# Patient Record
Sex: Male | Born: 1960 | Race: White | Hispanic: No | Marital: Married | Smoking: Former smoker
Health system: Southern US, Community
[De-identification: ages and names within clinical notes are randomized; demographics above are authoritative.]

## PROBLEM LIST (undated history)

## (undated) DIAGNOSIS — N189 Chronic kidney disease, unspecified: Secondary | ICD-10-CM

## (undated) DIAGNOSIS — I499 Cardiac arrhythmia, unspecified: Secondary | ICD-10-CM

## (undated) DIAGNOSIS — D649 Anemia, unspecified: Secondary | ICD-10-CM

## (undated) DIAGNOSIS — E785 Hyperlipidemia, unspecified: Secondary | ICD-10-CM

## (undated) DIAGNOSIS — Z86010 Personal history of colonic polyps: Secondary | ICD-10-CM

## (undated) DIAGNOSIS — R011 Cardiac murmur, unspecified: Secondary | ICD-10-CM

## (undated) DIAGNOSIS — Z87442 Personal history of urinary calculi: Secondary | ICD-10-CM

## (undated) DIAGNOSIS — T7840XA Allergy, unspecified, initial encounter: Secondary | ICD-10-CM

## (undated) DIAGNOSIS — M1990 Unspecified osteoarthritis, unspecified site: Secondary | ICD-10-CM

## (undated) DIAGNOSIS — I10 Essential (primary) hypertension: Secondary | ICD-10-CM

## (undated) DIAGNOSIS — Z860101 Personal history of adenomatous and serrated colon polyps: Secondary | ICD-10-CM

## (undated) DIAGNOSIS — Z8719 Personal history of other diseases of the digestive system: Secondary | ICD-10-CM

## (undated) DIAGNOSIS — K219 Gastro-esophageal reflux disease without esophagitis: Secondary | ICD-10-CM

## (undated) HISTORY — DX: Cardiac murmur, unspecified: R01.1

## (undated) HISTORY — PX: OTHER SURGICAL HISTORY: SHX169

## (undated) HISTORY — DX: Gastro-esophageal reflux disease without esophagitis: K21.9

## (undated) HISTORY — PX: CHOLECYSTECTOMY: SHX55

## (undated) HISTORY — PX: VASECTOMY: SHX75

## (undated) HISTORY — DX: Anemia, unspecified: D64.9

## (undated) HISTORY — DX: Personal history of colonic polyps: Z86.010

## (undated) HISTORY — DX: Personal history of adenomatous and serrated colon polyps: Z86.0101

## (undated) HISTORY — DX: Hyperlipidemia, unspecified: E78.5

## (undated) HISTORY — DX: Allergy, unspecified, initial encounter: T78.40XA

## (undated) HISTORY — PX: APPENDECTOMY: SHX54

## (undated) HISTORY — PX: COLONOSCOPY: SHX174

## (undated) HISTORY — DX: Chronic kidney disease, unspecified: N18.9

---

## 2014-11-11 ENCOUNTER — Ambulatory Visit (INDEPENDENT_AMBULATORY_CARE_PROVIDER_SITE_OTHER): Payer: BLUE CROSS/BLUE SHIELD | Admitting: Family Medicine

## 2014-11-11 VITALS — BP 152/92 | HR 107 | Temp 98.4°F | Resp 18 | Ht 70.0 in | Wt 236.0 lb

## 2014-11-11 DIAGNOSIS — R05 Cough: Secondary | ICD-10-CM | POA: Diagnosis not present

## 2014-11-11 DIAGNOSIS — J069 Acute upper respiratory infection, unspecified: Secondary | ICD-10-CM | POA: Diagnosis not present

## 2014-11-11 DIAGNOSIS — R030 Elevated blood-pressure reading, without diagnosis of hypertension: Secondary | ICD-10-CM | POA: Diagnosis not present

## 2014-11-11 DIAGNOSIS — R059 Cough, unspecified: Secondary | ICD-10-CM

## 2014-11-11 MED ORDER — HYDROCODONE-HOMATROPINE 5-1.5 MG/5ML PO SYRP
ORAL_SOLUTION | ORAL | Status: DC
Start: 2014-11-11 — End: 2015-02-21

## 2014-11-11 MED ORDER — AZITHROMYCIN 250 MG PO TABS: ORAL_TABLET | ORAL | Status: DC

## 2014-11-11 NOTE — Patient Instructions (Signed)
Keep a record of your blood pressures and heart rate outside of the office and BP remaining over 140/90, or heart rate over 100 - return here or other care provider.  Saline nasal spray atleast 4 times per day, over the counter mucinex or mucinex DM for cough during day, hydrocodone cough syrup if needed at night, drink plenty of fluids.   If you are not improving at a week to 10 days, or worsening - can fill antibiotic as discussed, but currently this appears to be due to a virus. See other information below. If fever or shortness of breath - be seen by care provider.   Cough, Adult  A cough is a reflex that helps clear your throat and airways. It can help heal the body or may be a reaction to an irritated airway. A cough may only last 2 or 3 weeks (acute) or may last more than 8 weeks (chronic).  CAUSES Acute cough:  Viral or bacterial infections. Chronic cough:  Infections.  Allergies.  Asthma.  Post-nasal drip.  Smoking.  Heartburn or acid reflux.  Some medicines.  Chronic lung problems (COPD).  Cancer. SYMPTOMS   Cough.  Fever.  Chest pain.  Increased breathing rate.  High-pitched whistling sound when breathing (wheezing).  Colored mucus that you cough up (sputum). TREATMENT   A bacterial cough may be treated with antibiotic medicine.  A viral cough must run its course and will not respond to antibiotics.  Your caregiver may recommend other treatments if you have a chronic cough. HOME CARE INSTRUCTIONS   Only take over-the-counter or prescription medicines for pain, discomfort, or fever as directed by your caregiver. Use cough suppressants only as directed by your caregiver.  Use a cold steam vaporizer or humidifier in your bedroom or home to help loosen secretions.  Sleep in a semi-upright position if your cough is worse at night.  Rest as needed.  Stop smoking if you smoke. SEEK IMMEDIATE MEDICAL CARE IF:   You have pus in your sputum.  Your  cough starts to worsen.  You cannot control your cough with suppressants and are losing sleep.  You begin coughing up blood.  You have difficulty breathing.  You develop pain which is getting worse or is uncontrolled with medicine.  You have a fever. MAKE SURE YOU:   Understand these instructions.  Will watch your condition.  Will get help right away if you are not doing well or get worse. Document Released: 08/18/2010 Document Revised: 05/14/2011 Document Reviewed: 08/18/2010 [facility] Patient Information 2015 [place], [place]. This information is not intended to replace advice given to you by your health care provider. Make sure you discuss any questions you have with your health care provider.   Upper Respiratory Infection, Adult An upper respiratory infection (URI) is also sometimes known as the common cold. The upper respiratory tract includes the nose, sinuses, throat, trachea, and bronchi. Bronchi are the airways leading to the lungs. Most people improve within 1 week, but symptoms can last up to 2 weeks. A residual cough may last even longer.  CAUSES Many different viruses can infect the tissues lining the upper respiratory tract. The tissues become irritated and inflamed and often become very moist. Mucus production is also common. A cold is contagious. You can easily spread the virus to others by oral contact. This includes kissing, sharing a glass, coughing, or sneezing. Touching your mouth or nose and then touching a surface, which is then touched by another person, can also spread  the virus. SYMPTOMS  Symptoms typically develop 1 to 3 days after you come in contact with a cold virus. Symptoms vary from person to person. They may include:  Runny nose.  Sneezing.  Nasal congestion.  Sinus irritation.  Sore throat.  Loss of voice (laryngitis).  Cough.  Fatigue.  Muscle aches.  Loss of appetite.  Headache.  Low-grade fever. DIAGNOSIS  You might diagnose  your own cold based on familiar symptoms, since most people get a cold 2 to 3 times a year. Your caregiver can confirm this based on your exam. Most importantly, your caregiver can check that your symptoms are not due to another disease such as strep throat, sinusitis, pneumonia, asthma, or epiglottitis. Blood tests, throat tests, and X-rays are not necessary to diagnose a common cold, but they may sometimes be helpful in excluding other more serious diseases. Your caregiver will decide if any further tests are required. RISKS AND COMPLICATIONS  You may be at risk for a more severe case of the common cold if you smoke cigarettes, have chronic heart disease (such as heart failure) or lung disease (such as asthma), or if you have a weakened immune system. The very young and very old are also at risk for more serious infections. Bacterial sinusitis, middle ear infections, and bacterial pneumonia can complicate the common cold. The common cold can worsen asthma and chronic obstructive pulmonary disease (COPD). Sometimes, these complications can require emergency medical care and may be life-threatening. PREVENTION  The best way to protect against getting a cold is to practice good hygiene. Avoid oral or hand contact with people with cold symptoms. Wash your hands often if contact occurs. There is no clear evidence that vitamin C, vitamin E, echinacea, or exercise reduces the chance of developing a cold. However, it is always recommended to get plenty of rest and practice good nutrition. TREATMENT  Treatment is directed at relieving symptoms. There is no cure. Antibiotics are not effective, because the infection is caused by a virus, not by bacteria. Treatment may include:  Increased fluid intake. Sports drinks offer valuable electrolytes, sugars, and fluids.  Breathing heated mist or steam (vaporizer or shower).  Eating chicken soup or other clear broths, and maintaining good nutrition.  Getting plenty of  rest.  Using gargles or lozenges for comfort.  Controlling fevers with ibuprofen or acetaminophen as directed by your caregiver.  Increasing usage of your inhaler if you have asthma. Zinc gel and zinc lozenges, taken in the first 24 hours of the common cold, can shorten the duration and lessen the severity of symptoms. Pain medicines may help with fever, muscle aches, and throat pain. A variety of non-prescription medicines are available to treat congestion and runny nose. Your caregiver can make recommendations and may suggest nasal or lung inhalers for other symptoms.  HOME CARE INSTRUCTIONS   Only take over-the-counter or prescription medicines for pain, discomfort, or fever as directed by your caregiver.  Use a warm mist humidifier or inhale steam from a shower to increase air moisture. This may keep secretions moist and make it easier to breathe.  Drink enough water and fluids to keep your urine clear or pale yellow.  Rest as needed.  Return to work when your temperature has returned to normal or as your caregiver advises. You may need to stay home longer to avoid infecting others. You can also use a face mask and careful hand washing to prevent spread of the virus. SEEK MEDICAL CARE IF:   After  the first few days, you feel you are getting worse rather than better.  You need your caregiver's advice about medicines to control symptoms.  You develop chills, worsening shortness of breath, or brown or red sputum. These may be signs of pneumonia.  You develop yellow or brown nasal discharge or pain in the face, especially when you bend forward. These may be signs of sinusitis.  You develop a fever, swollen neck glands, pain with swallowing, or white areas in the back of your throat. These may be signs of strep throat. SEEK IMMEDIATE MEDICAL CARE IF:   You have a fever.  You develop severe or persistent headache, ear pain, sinus pain, or chest pain.  You develop wheezing, a  prolonged cough, cough up blood, or have a change in your usual mucus (if you have chronic lung disease).  You develop sore muscles or a stiff neck. Document Released: 08/15/2000 Document Revised: 05/14/2011 Document Reviewed: 05/27/2013 [facility] Patient Information 2015 [place], [place]. This information is not intended to replace advice given to you by your health care provider. Make sure you discuss any questions you have with your health care provider.

## 2014-11-11 NOTE — Progress Notes (Signed)
Subjective:  This chart was scribed for Corliss Parish, MD by Leandra Kern, Medical Scribe. This patient was seen in Room 10 and the patient's care was started at 10:30 AM.   Patient ID: Patrick [name], male    DOB: 11-Mar-1960, 54 y.o.   MRN: [id number]  Chief Complaint  Patient presents with  . Nasal Congestion    x 4 days  . Cough  . Sore Throat  . Chest Pain   HPI HPI Comments: Patrick [name] is a 54 y.o. male who presents to [facility] complaining of nasal congestion, gradual onset 4 days ago.  Pt reports that the symptoms initially started as nasal congestion, sinus pressure, and subjective fever, and then progressed into chest congestion and cough that is keeping him up at night yesterday. Pt reports that he does not have any previous medical history of asthma. Pt takes Musinex DM and mullein cough syrup for the symptoms, however he only found mild relief with them. Pt denies chest pain, or difficulty breathing. Pt notes that his wife was seen by for similar respiratory illness, she was prescribed Z-pack for the symptoms. Pt indicates that he uses Benadryl BID, and Zyrtec for his seasonal allergies.   Pt is planning to travel to [place] in 2 days.       There are no active problems to display for this patient.  Past Medical History  Diagnosis Date  . Allergy   . Heart murmur    Past Surgical History  Procedure Laterality Date  . Appendectomy    . Coronary artery bypass graft    . Vasectomy     Allergies  Allergen Reactions  . Penicillins Rash    Rash around neck    Prior to Admission medications   Medication Sig Start Date End Date Taking? Authorizing Provider  aspirin 81 MG tablet Take 81 mg by mouth daily.   Yes Historical Provider, MD  cetirizine (ZYRTEC) 10 MG tablet Take 10 mg by mouth daily.   Yes Historical Provider, MD  diphenhydrAMINE (BENADRYL) 25 MG tablet Take 25 mg by mouth every 6 (six) hours as needed.   Yes Historical Provider, MD    glucosamine-chondroitin 500-400 MG tablet Take 500 tablets by mouth 2 (two) times daily.   Yes Historical Provider, MD  Javier Docker Oil 300 MG CAPS Take 300 mg by mouth every morning.   Yes Historical Provider, MD  Multiple Vitamins-Minerals (CENTRUM SILVER ADULT 50+ PO) Take by mouth.   Yes Historical Provider, MD  niacin 500 MG tablet Take 500 mg by mouth 2 (two) times daily.   Yes Historical Provider, MD   Social History   Social History  . Marital Status: Married    Spouse Name: N/A  . Number of Children: N/A  . Years of Education: N/A   Occupational History  . Not on file.   Social History Main Topics  . Smoking status: Never Smoker   . Smokeless tobacco: Not on file  . Alcohol Use: Not on file  . Drug Use: Not on file  . Sexual Activity: Not on file   Other Topics Concern  . Not on file   Social History Narrative  . No narrative on file    Review of Systems  Constitutional: Positive for fever. Negative for fatigue and unexpected weight change.  HENT: Positive for congestion and sinus pressure.   Eyes: Negative for visual disturbance.  Respiratory: Positive for cough and chest tightness. Negative for shortness of breath.  Cardiovascular: Negative for chest pain, palpitations and leg swelling.  Gastrointestinal: Negative for abdominal pain and blood in stool.  Neurological: Negative for dizziness, light-headedness and headaches.      Objective:   Physical Exam  Constitutional: He is oriented to person, place, and time. He appears well-developed and well-nourished.  HENT:  Head: Normocephalic and atraumatic.  Right Ear: Tympanic membrane, external ear and ear canal normal.  Left Ear: Tympanic membrane, external ear and ear canal normal.  Nose: No rhinorrhea.  Mouth/Throat: Oropharynx is clear and moist and mucous membranes are normal. No oropharyngeal exudate or posterior oropharyngeal erythema.  Minimal clear nasal discharge. Sinuses are non tender.   Eyes:  Conjunctivae are normal. Pupils are equal, round, and reactive to light.  Neck: Neck supple.  Cardiovascular: Normal rate, regular rhythm, normal heart sounds and intact distal pulses.   No murmur heard. Pulmonary/Chest: Effort normal and breath sounds normal. No respiratory distress. He has no wheezes. He has no rhonchi. He has no rales.  Abdominal: Soft. There is no tenderness.  Lymphadenopathy:    He has no cervical adenopathy.  Neurological: He is alert and oriented to person, place, and time.  Skin: Skin is warm and dry. No rash noted.  Psychiatric: He has a normal mood and affect. His behavior is normal.  Vitals reviewed.   Filed Vitals:   11/11/14 1031  BP: 152/92  Pulse: 107  Temp: 98.4 F (36.9 C)  TempSrc: Oral  Resp: 18  Height: 5\' 10"  (1.778 m)  Weight: 236 lb (107.049 kg)  SpO2: 98%      Assessment & Plan:   Patrick [name] is a 54 y.o. male Acute upper respiratory infection, Cough - Plan: HYDROcodone-homatropine (HYCODAN) 5-1.5 MG/5ML syrup, azithromycin (ZITHROMAX) 250 MG tablet  - Symptomatic care discussed for probable viral infection at this time. With upcoming travel, prescribed azithromycin if needed if symptoms are not improving as discussed in after visit summary, but again appears to be viral at this point  -Hydrocodone cough syrup provided at nighttime if needed for cough, continue Mucinex during the day if needed.  Elevated blood pressure reading without diagnosis of hypertension  -May be related to cough. Check blood pressures outside the office and if remaining elevated should follow-up for evaluation.   Meds ordered this encounter  Medications  . aspirin 81 MG tablet    Sig: Take 81 mg by mouth daily.  . diphenhydrAMINE (BENADRYL) 25 MG tablet    Sig: Take 25 mg by mouth every 6 (six) hours as needed.  . cetirizine (ZYRTEC) 10 MG tablet    Sig: Take 10 mg by mouth daily.  . niacin 500 MG tablet    Sig: Take 500 mg by mouth 2 (two) times daily.    Marland Kitchen glucosamine-chondroitin 500-400 MG tablet    Sig: Take 500 tablets by mouth 2 (two) times daily.  Javier Docker Oil 300 MG CAPS    Sig: Take 300 mg by mouth every morning.  . Multiple Vitamins-Minerals (CENTRUM SILVER ADULT 50+ PO)    Sig: Take by mouth.  Marland Kitchen HYDROcodone-homatropine (HYCODAN) 5-1.5 MG/5ML syrup    Sig: 15m by mouth a bedtime as needed for cough.    Dispense:  120 mL    Refill:  0  . azithromycin (ZITHROMAX) 250 MG tablet    Sig: Take 2 pills by mouth on day 1, then 1 pill by mouth per day on days 2 through 5.    Dispense:  6 tablet    Refill:  0   Patient Instructions  Keep a record of your blood pressures and heart rate outside of the office and BP remaining over 140/90, or heart rate over 100 - return here or other care provider.  Saline nasal spray atleast 4 times per day, over the counter mucinex or mucinex DM for cough during day, hydrocodone cough syrup if needed at night, drink plenty of fluids.   If you are not improving at a week to 10 days, or worsening - can fill antibiotic as discussed, but currently this appears to be due to a virus. See other information below. If fever or shortness of breath - be seen by care provider.   Cough, Adult  A cough is a reflex that helps clear your throat and airways. It can help heal the body or may be a reaction to an irritated airway. A cough may only last 2 or 3 weeks (acute) or may last more than 8 weeks (chronic).  CAUSES Acute cough:  Viral or bacterial infections. Chronic cough:  Infections.  Allergies.  Asthma.  Post-nasal drip.  Smoking.  Heartburn or acid reflux.  Some medicines.  Chronic lung problems (COPD).  Cancer. SYMPTOMS   Cough.  Fever.  Chest pain.  Increased breathing rate.  High-pitched whistling sound when breathing (wheezing).  Colored mucus that you cough up (sputum). TREATMENT   A bacterial cough may be treated with antibiotic medicine.  A viral cough must run its course and  will not respond to antibiotics.  Your caregiver may recommend other treatments if you have a chronic cough. HOME CARE INSTRUCTIONS   Only take over-the-counter or prescription medicines for pain, discomfort, or fever as directed by your caregiver. Use cough suppressants only as directed by your caregiver.  Use a cold steam vaporizer or humidifier in your bedroom or home to help loosen secretions.  Sleep in a semi-upright position if your cough is worse at night.  Rest as needed.  Stop smoking if you smoke. SEEK IMMEDIATE MEDICAL CARE IF:   You have pus in your sputum.  Your cough starts to worsen.  You cannot control your cough with suppressants and are losing sleep.  You begin coughing up blood.  You have difficulty breathing.  You develop pain which is getting worse or is uncontrolled with medicine.  You have a fever. MAKE SURE YOU:   Understand these instructions.  Will watch your condition.  Will get help right away if you are not doing well or get worse. Document Released: 08/18/2010 Document Revised: 05/14/2011 Document Reviewed: 08/18/2010 [facility] Patient Information 2015 [place], [place]. This information is not intended to replace advice given to you by your health care provider. Make sure you discuss any questions you have with your health care provider.   Upper Respiratory Infection, Adult An upper respiratory infection (URI) is also sometimes known as the common cold. The upper respiratory tract includes the nose, sinuses, throat, trachea, and bronchi. Bronchi are the airways leading to the lungs. Most people improve within 1 week, but symptoms can last up to 2 weeks. A residual cough may last even longer.  CAUSES Many different viruses can infect the tissues lining the upper respiratory tract. The tissues become irritated and inflamed and often become very moist. Mucus production is also common. A cold is contagious. You can easily spread the virus to others  by oral contact. This includes kissing, sharing a glass, coughing, or sneezing. Touching your mouth or nose and then touching a surface, which is then  touched by another person, can also spread the virus. SYMPTOMS  Symptoms typically develop 1 to 3 days after you come in contact with a cold virus. Symptoms vary from person to person. They may include:  Runny nose.  Sneezing.  Nasal congestion.  Sinus irritation.  Sore throat.  Loss of voice (laryngitis).  Cough.  Fatigue.  Muscle aches.  Loss of appetite.  Headache.  Low-grade fever. DIAGNOSIS  You might diagnose your own cold based on familiar symptoms, since most people get a cold 2 to 3 times a year. Your caregiver can confirm this based on your exam. Most importantly, your caregiver can check that your symptoms are not due to another disease such as strep throat, sinusitis, pneumonia, asthma, or epiglottitis. Blood tests, throat tests, and X-rays are not necessary to diagnose a common cold, but they may sometimes be helpful in excluding other more serious diseases. Your caregiver will decide if any further tests are required. RISKS AND COMPLICATIONS  You may be at risk for a more severe case of the common cold if you smoke cigarettes, have chronic heart disease (such as heart failure) or lung disease (such as asthma), or if you have a weakened immune system. The very young and very old are also at risk for more serious infections. Bacterial sinusitis, middle ear infections, and bacterial pneumonia can complicate the common cold. The common cold can worsen asthma and chronic obstructive pulmonary disease (COPD). Sometimes, these complications can require emergency medical care and may be life-threatening. PREVENTION  The best way to protect against getting a cold is to practice good hygiene. Avoid oral or hand contact with people with cold symptoms. Wash your hands often if contact occurs. There is no clear evidence that vitamin C,  vitamin E, echinacea, or exercise reduces the chance of developing a cold. However, it is always recommended to get plenty of rest and practice good nutrition. TREATMENT  Treatment is directed at relieving symptoms. There is no cure. Antibiotics are not effective, because the infection is caused by a virus, not by bacteria. Treatment may include:  Increased fluid intake. Sports drinks offer valuable electrolytes, sugars, and fluids.  Breathing heated mist or steam (vaporizer or shower).  Eating chicken soup or other clear broths, and maintaining good nutrition.  Getting plenty of rest.  Using gargles or lozenges for comfort.  Controlling fevers with ibuprofen or acetaminophen as directed by your caregiver.  Increasing usage of your inhaler if you have asthma. Zinc gel and zinc lozenges, taken in the first 24 hours of the common cold, can shorten the duration and lessen the severity of symptoms. Pain medicines may help with fever, muscle aches, and throat pain. A variety of non-prescription medicines are available to treat congestion and runny nose. Your caregiver can make recommendations and may suggest nasal or lung inhalers for other symptoms.  HOME CARE INSTRUCTIONS   Only take over-the-counter or prescription medicines for pain, discomfort, or fever as directed by your caregiver.  Use a warm mist humidifier or inhale steam from a shower to increase air moisture. This may keep secretions moist and make it easier to breathe.  Drink enough water and fluids to keep your urine clear or pale yellow.  Rest as needed.  Return to work when your temperature has returned to normal or as your caregiver advises. You may need to stay home longer to avoid infecting others. You can also use a face mask and careful hand washing to prevent spread of the virus. SEEK  MEDICAL CARE IF:   After the first few days, you feel you are getting worse rather than better.  You need your caregiver's advice  about medicines to control symptoms.  You develop chills, worsening shortness of breath, or brown or red sputum. These may be signs of pneumonia.  You develop yellow or brown nasal discharge or pain in the face, especially when you bend forward. These may be signs of sinusitis.  You develop a fever, swollen neck glands, pain with swallowing, or white areas in the back of your throat. These may be signs of strep throat. SEEK IMMEDIATE MEDICAL CARE IF:   You have a fever.  You develop severe or persistent headache, ear pain, sinus pain, or chest pain.  You develop wheezing, a prolonged cough, cough up blood, or have a change in your usual mucus (if you have chronic lung disease).  You develop sore muscles or a stiff neck. Document Released: 08/15/2000 Document Revised: 05/14/2011 Document Reviewed: 05/27/2013 [facility] Patient Information 2015 [place], [place]. This information is not intended to replace advice given to you by your health care provider. Make sure you discuss any questions you have with your health care provider.     I personally performed the services described in this documentation, which was scribed in my presence. The recorded information has been reviewed and considered, and addended by me as needed.

## 2014-12-15 ENCOUNTER — Ambulatory Visit (INDEPENDENT_AMBULATORY_CARE_PROVIDER_SITE_OTHER): Payer: BLUE CROSS/BLUE SHIELD

## 2014-12-15 ENCOUNTER — Ambulatory Visit (INDEPENDENT_AMBULATORY_CARE_PROVIDER_SITE_OTHER): Payer: BLUE CROSS/BLUE SHIELD | Admitting: Emergency Medicine

## 2014-12-15 VITALS — BP 130/82 | HR 80 | Temp 98.2°F | Resp 18 | Ht 70.0 in | Wt 235.0 lb

## 2014-12-15 DIAGNOSIS — R059 Cough, unspecified: Secondary | ICD-10-CM

## 2014-12-15 DIAGNOSIS — R05 Cough: Secondary | ICD-10-CM

## 2014-12-15 MED ORDER — PREDNISONE 10 MG PO TABS: ORAL_TABLET | ORAL | Status: DC

## 2014-12-15 MED ORDER — BENZONATATE 100 MG PO CAPS: 100.0000 mg | ORAL_CAPSULE | Freq: Three times a day (TID) | ORAL | Status: DC | PRN

## 2014-12-15 MED ORDER — ALBUTEROL SULFATE (2.5 MG/3ML) 0.083% IN NEBU
2.5000 mg | INHALATION_SOLUTION | Freq: Once | RESPIRATORY_TRACT | Status: AC
  Administered 2014-12-15: 2.5 mg via RESPIRATORY_TRACT

## 2014-12-15 MED ORDER — IPRATROPIUM BROMIDE 0.02 % IN SOLN
0.5000 mg | Freq: Once | RESPIRATORY_TRACT | Status: AC
  Administered 2014-12-15: 0.5 mg via RESPIRATORY_TRACT

## 2014-12-15 NOTE — Patient Instructions (Signed)
Bronchospasm, Adult A bronchospasm is when the tubes that carry air in and out of your lungs (airways) spasm or tighten. During a bronchospasm it is hard to breathe. This is because the airways get smaller. A bronchospasm can be triggered by:  Allergies. These may be to animals, pollen, food, or mold.  Infection. This is a common cause of bronchospasm.  Exercise.  Irritants. These include pollution, cigarette smoke, strong odors, aerosol sprays, and paint fumes.  Weather changes.  Stress.  Being emotional. HOME CARE   Always have a plan for getting help. Know when to call your doctor and local emergency services (911 in the [place] Know where you can get emergency care.  Only take medicines as told by your doctor.  If you were prescribed an inhaler or nebulizer machine, ask your doctor how to use it correctly. Always use a spacer with your inhaler if you were given one.  Stay calm during an attack. Try to relax and breathe more slowly.  Control your home environment:  Change your heating and air conditioning filter at least once a month.  Limit your use of fireplaces and wood stoves.  Do not  smoke. Do not  allow smoking in your home.  Avoid perfumes and fragrances.  Get rid of pests (such as roaches and mice) and their droppings.  Throw away plants if you see mold on them.  Keep your house clean and dust free.  Replace carpet with wood, tile, or vinyl flooring. Carpet can trap dander and dust.  Use allergy-proof pillows, mattress covers, and box spring covers.  Wash bed sheets and blankets every week in hot water. Dry them in a dryer.  Use blankets that are made of polyester or cotton.  Wash hands frequently. GET HELP IF:  You have muscle aches.  You have chest pain.  The thick spit you spit or cough up (sputum) changes from clear or white to yellow, green, gray, or bloody.  The thick spit you spit or cough up gets thicker.  There are problems that may be  related to the medicine you are given such as:  A rash.  Itching.  Swelling.  Trouble breathing. GET HELP RIGHT AWAY IF:  You feel you cannot breathe or catch your breath.  You cannot stop coughing.  Your treatment is not helping you breathe better.  You have very bad chest pain. MAKE SURE YOU:   Understand these instructions.  Will watch your condition.  Will get help right away if you are not doing well or get worse.   This information is not intended to replace advice given to you by your health care provider. Make sure you discuss any questions you have with your health care provider.   Document Released: 12/17/2008 Document Revised: 03/12/2014 Document Reviewed: 08/12/2012 Elsevier Interactive Patient Education 2016 [employer]  

## 2014-12-15 NOTE — Progress Notes (Addendum)
Patient ID: Patrick [name], male   DOB: 1960-11-08, 54 y.o.   MRN: [id number]    By signing my name below, I, Essence Howell, attest that this documentation has been prepared under the direction and in the presence of Darlyne Russian, MD Electronically Signed: Ladene Artist, ED Scribe 12/15/2014 at 12:21 PM.  Chief Complaint:  Chief Complaint  Patient presents with  . Chest congestion    Onset 1 month on and off   HPI: Patrick [name] is a 54 y.o. male who reports to [facility] today complaining of persistent dry cough for the past month. Pt reports that cough worsens as the day progresses. He was seen by colleague Dr. [name] on 11/11/14 for URI with symptoms of cough, sore throat, postnasal drip and rhinorrhea. Pt was prescribed zpak and Hycodan which he states provided some relief but did not completely resolve cough. Today, he reports associated mild wheezing. Pt's wife is sick with similar symptoms. He currently takes Zyrtec daily and Benadryl x2 daily for environmental allergies. No h/o asthma. Pt is a nonsmoker.   Past Medical History  Diagnosis Date  . Allergy   . Heart murmur    Past Surgical History  Procedure Laterality Date  . Appendectomy    . Coronary artery bypass graft    . Vasectomy     Social History   Social History  . Marital Status: Married    Spouse Name: N/A  . Number of Children: N/A  . Years of Education: N/A   Social History Main Topics  . Smoking status: Never Smoker   . Smokeless tobacco: None  . Alcohol Use: None  . Drug Use: None  . Sexual Activity: Not Asked   Other Topics Concern  . None   Social History Narrative   No family history on file. Allergies  Allergen Reactions  . Penicillins Rash    Rash around neck    Prior to Admission medications   Medication Sig Start Date End Date Taking? Authorizing Provider  aspirin 81 MG tablet Take 81 mg by mouth daily.   Yes Historical Provider, MD  cetirizine (ZYRTEC) 10 MG tablet Take 10 mg by mouth  daily.   Yes Historical Provider, MD  diphenhydrAMINE (BENADRYL) 25 MG tablet Take 25 mg by mouth every 6 (six) hours as needed.   Yes Historical Provider, MD  glucosamine-chondroitin 500-400 MG tablet Take 500 tablets by mouth 2 (two) times daily.   Yes Historical Provider, MD  HYDROcodone-homatropine (HYCODAN) 5-1.5 MG/5ML syrup 68m by mouth a bedtime as needed for cough. 11/11/14  Yes Wendie Agreste, MD  Krill Oil 300 MG CAPS Take 300 mg by mouth every morning.   Yes Historical Provider, MD  Multiple Vitamins-Minerals (CENTRUM SILVER ADULT 50+ PO) Take by mouth.   Yes Historical Provider, MD  niacin 500 MG tablet Take 500 mg by mouth 2 (two) times daily.   Yes Historical Provider, MD    ROS: The patient denies fevers, chills, night sweats, unintentional weight loss, chest pain, palpitations, dyspnea on exertion, nausea, vomiting, abdominal pain, dysuria, hematuria, melena, numbness, weakness, or tingling. +cough, +wheezing, +environmental allergies  All other systems have been reviewed and were otherwise negative with the exception of those mentioned in the HPI and as above.    PHYSICAL EXAM: Filed Vitals:   12/15/14 1103  BP: 130/82  Pulse: 80  Temp: 98.2 F (36.8 C)  Resp: 18   Body mass index is 33.72 kg/(m^2).  General: Alert, no acute distress HEENT:  Normocephalic, atraumatic, oropharynx patent. Eye: Juliette Mangle [facility] Cardiovascular:  Regular rate and rhythm, no rubs murmurs or gallops. No Carotid bruits, radial pulse intact. No pedal edema.  Respiratory: Clear to auscultation bilaterally. Mild prolongation of expiration. No wheezes, rales, or rhonchi. No cyanosis, no use of accessory musculature.  Abdominal: No organomegaly, abdomen is soft and non-tender, positive bowel sounds. No masses. Musculoskeletal: Gait intact. No edema, tenderness Skin: No rashes. Neurologic: Facial musculature symmetric. Psychiatric: Patient acts appropriately throughout our interaction. Lymphatic: No  cervical or submandibular lymphadenopathy  LABS: No results found for this or any previous visit. EKG/XRAY:   Primary read interpreted by Dr. Everlene Farrier at [facility] No acute disease    ASSESSMENT/PLAN: Patient felt better after his nebulizer treatment. Will treat with Tessalon Perles and a prednisone taper. Recheck following treatment. He did not really have any GI symptoms.I personally performed the services described in this documentation, which was scribed in my presence. The recorded information has been reviewed and is accurate.   Gross sideeffects, risk and benefits, and alternatives of medications d/w patient. Patient is aware that all medications have potential sideeffects and we are unable to predict every sideeffect or drug-drug interaction that may occur.  Arlyss Queen MD 12/15/2014 12:10 PM

## 2015-02-21 ENCOUNTER — Ambulatory Visit (INDEPENDENT_AMBULATORY_CARE_PROVIDER_SITE_OTHER): Payer: BLUE CROSS/BLUE SHIELD | Admitting: Family Medicine

## 2015-02-21 ENCOUNTER — Encounter: Payer: Self-pay | Admitting: Family Medicine

## 2015-02-21 VITALS — BP 114/82 | HR 79 | Temp 98.7°F | Resp 16 | Ht 69.5 in | Wt 234.2 lb

## 2015-02-21 DIAGNOSIS — Z0000 Encounter for general adult medical examination without abnormal findings: Secondary | ICD-10-CM

## 2015-02-21 DIAGNOSIS — Z1159 Encounter for screening for other viral diseases: Secondary | ICD-10-CM | POA: Diagnosis not present

## 2015-02-21 DIAGNOSIS — R221 Localized swelling, mass and lump, neck: Secondary | ICD-10-CM | POA: Diagnosis not present

## 2015-02-21 DIAGNOSIS — R002 Palpitations: Secondary | ICD-10-CM

## 2015-02-21 DIAGNOSIS — Z125 Encounter for screening for malignant neoplasm of prostate: Secondary | ICD-10-CM

## 2015-02-21 DIAGNOSIS — Z13220 Encounter for screening for lipoid disorders: Secondary | ICD-10-CM | POA: Diagnosis not present

## 2015-02-21 DIAGNOSIS — D224 Melanocytic nevi of scalp and neck: Secondary | ICD-10-CM | POA: Diagnosis not present

## 2015-02-21 DIAGNOSIS — Z23 Encounter for immunization: Secondary | ICD-10-CM | POA: Diagnosis not present

## 2015-02-21 DIAGNOSIS — J309 Allergic rhinitis, unspecified: Secondary | ICD-10-CM

## 2015-02-21 DIAGNOSIS — J3089 Other allergic rhinitis: Secondary | ICD-10-CM | POA: Insufficient documentation

## 2015-02-21 DIAGNOSIS — Z131 Encounter for screening for diabetes mellitus: Secondary | ICD-10-CM | POA: Diagnosis not present

## 2015-02-21 DIAGNOSIS — Z1211 Encounter for screening for malignant neoplasm of colon: Secondary | ICD-10-CM | POA: Diagnosis not present

## 2015-02-21 LAB — LIPID PANEL
CHOLESTEROL: 199 mg/dL (ref 125–200)
HDL: 44 mg/dL (ref >=40)
LDL Cholesterol: 102 mg/dL (ref <130)
Total CHOL/HDL Ratio: 4.5 Ratio (ref <=5.0)
Triglycerides: 266 mg/dL — ABNORMAL HIGH (ref <150)
VLDL: 53 mg/dL — ABNORMAL HIGH (ref <30)

## 2015-02-21 LAB — COMPLETE METABOLIC PANEL WITH GFR
ALBUMIN: 4.2 g/dL (ref 3.6–5.1)
ALT: 37 U/L (ref 9–46)
AST: 28 U/L (ref 10–35)
Alkaline Phosphatase: 57 U/L (ref 40–115)
BUN: 15 mg/dL (ref 7–25)
CALCIUM: 9.2 mg/dL (ref 8.6–10.3)
CHLORIDE: 105 mmol/L (ref 98–110)
CO2: 28 mmol/L (ref 20–31)
CREATININE: 0.91 mg/dL (ref 0.70–1.33)
GFR, Est African American: >89 mL/min (ref >=60)
GFR, Est Non African American: >89 mL/min (ref >=60)
Glucose, Bld: 86 mg/dL (ref 65–99)
Potassium: 4.4 mmol/L (ref 3.5–5.3)
Sodium: 140 mmol/L (ref 135–146)
Total Bilirubin: 0.7 mg/dL (ref 0.2–1.2)
Total Protein: 6.9 g/dL (ref 6.1–8.1)

## 2015-02-21 LAB — HEPATITIS C ANTIBODY: HCV AB: NEGATIVE

## 2015-02-21 LAB — TSH: TSH: 1.658 u[IU]/mL (ref 0.350–4.500)

## 2015-02-21 NOTE — Progress Notes (Signed)
Subjective:  This chart was scribed for [name], MD by Moises Blood, Medical Scribe. This patient was seen in room 26 and the patient's care was started 10:40 AM.   Patient ID: Patrick [name], male    DOB: Jan 07, 1961, 54 y.o.   MRN: [id number] Chief Complaint  Patient presents with  . Annual Exam   HPI Patrick [name] is a 54 y.o. male Here for annual physical.   Cancer Screening Colonoscopy: He denies having one done yet.  Prostate, PSA: most recent 2011, normal.   Immunizations He was recommended flu shot today but he declined it.  Last tdap done: >10 years ago.   Vision  Visual Acuity Screening   Right eye Left eye Both eyes  Without correction:     With correction: 20/25 20/15 20/13    He has seen within past year. He recently updated new corrective lenses.   Dentist He has seen within past year.   Exercise He exercises about twice a week: 1.5 hours on machines, and 30 minutes on treadmill or biking.   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 He had regular testing done.   Postnasal drip He has postnasal drip ongoing for a while now.  He takes benadryl daily for allergies. He denies sleepiness. He also takes zyrtec qd. He took flonase for a year without much relief. He mentions that spring and fall are his worse seasons.   Tinnitus He notes occasional tinnitus intermittently.   Cardiology He was seen by cardiologist 6-7 years ago for intermittent palpitations. He had EKG, and stress test done at the time, which appeared normal. He does notice an occasional skipped beat 1-2 a month. He denies taking medications. He denies chest pain.   Skin He notes having a couple bumps on his skin, a mole on his lower back, and a bump on the back of his neck.    There are no active problems to display for this  patient.  Past Medical History  Diagnosis Date  . Allergy   . Heart murmur    Past Surgical History  Procedure Laterality Date  . Appendectomy    . Coronary artery bypass graft    . Vasectomy    . Arthroscopic knee surgeries on both knees     Allergies  Allergen Reactions  . Penicillins Rash    Rash around neck    Prior to Admission medications   Medication Sig Start Date End Date Taking? Authorizing Provider  aspirin 81 MG tablet Take 81 mg by mouth daily.    Historical Provider, MD  benzonatate (TESSALON) 100 MG capsule Take 1-2 capsules (100-200 mg total) by mouth 3 (three) times daily as needed for cough. 12/15/14   Darlyne Russian, MD  cetirizine (ZYRTEC) 10 MG tablet Take 10 mg by mouth daily.    Historical Provider, MD  diphenhydrAMINE (BENADRYL) 25 MG tablet Take 25 mg by mouth every 6 (six) hours as needed.    Historical Provider, MD  glucosamine-chondroitin 500-400 MG tablet Take 500 tablets by mouth 2 (two) times daily.    Historical Provider, MD  HYDROcodone-homatropine [facility]) 5-1.5 MG/5ML syrup 64m by mouth a bedtime as needed for cough. 11/11/14   Wendie Agreste, MD  Krill Oil 300 MG CAPS Take 300 mg by mouth every morning.  Historical Provider, MD  Multiple Vitamins-Minerals (CENTRUM SILVER ADULT 50+ PO) Take by mouth.    Historical Provider, MD  niacin 500 MG tablet Take 500 mg by mouth 2 (two) times daily.    Historical Provider, MD  predniSONE (DELTASONE) 10 MG tablet take 4  A day for 3 days 3 a day for 3 days 2 a day for 3 days one a day for 3 days 12/15/14   Darlyne Russian, MD   Social History   Social History  . Marital Status: Married    Spouse Name: N/A  . Number of Children: N/A  . Years of Education: N/A   Occupational History  . [employer], Financial controller, Semi-retired    Social History Main Topics  . Smoking status: Never Smoker   . Smokeless tobacco: Not on file  . Alcohol Use: 3.0 oz/week    5 Standard drinks or equivalent per week  . Drug Use:  No  . Sexual Activity: Yes   Other Topics Concern  . Not on file   Social History Narrative   Married.   Education: College   Exercise: Yes   Review of Systems  HENT: Positive for postnasal drip and tinnitus.   Cardiovascular: Positive for palpitations. Negative for chest pain.       Objective:   Physical Exam  Cardiovascular: Normal rate, regular rhythm and normal heart sounds.  Exam reveals no gallop and no friction rub.   No murmur heard. No ectopy  Skin:  1-1.3cm slight hyperpigmented nevus on scalp with light component and dark component, no changes subjectively  Left lower back: hyperpigmented area stuck-on appearance  Left posterior neck: 5-24mm firm cystic versus nodular    Filed Vitals:   02/21/15 1006  BP: 114/82  Pulse: 79  Temp: 98.7 F (37.1 C)  TempSrc: Oral  Resp: 16  Height: 5' 9.5" (1.765 m)  Weight: 234 lb 3.2 oz (106.232 kg)  SpO2: 98%   EKG: t-wave inversion in lead iii, non-specific in v2, otherwise no other acute findings    Assessment & Plan:  Patrick [name] is a 54 y.o. male Annual physical exam  - -anticipatory guidance as below in AVS, screening labs above. Health maintenance items as above in HPI discussed/recommended as applicable.   Screen for colon cancer - Plan: Ambulatory referral to Gastroenterology  - refer for screening colonoscopy  Screening for prostate cancer - Plan: PSA  -We discussed pros and cons of prostate cancer screening, and after this discussion, he chose to have screening done. PSA obtained, and no concerning findings on DRE.   Screening for hyperlipidemia - Plan: Lipid panel  Screening for diabetes mellitus - Plan: COMPLETE METABOLIC PANEL WITH GFR  Need for Tdap vaccination - Plan: Tdap vaccine greater than or equal to 7yo IM given.   Palpitations - Plan: TSH, EKG 12-Lead  -intermittent, rare.  Stable recently, tsh pending, rtc precautions.   Allergic rhinitis, unspecified allergic rhinitis type  -cont  zyrtec, add flonase or nasonex if needed.   Nodule of neck - Plan: Ambulatory referral to Dermatology  - epidermal or sebaceous cyst possible, but will have eval by derm. If needed, rtc for recheck- especially if other areas noted.   Nevus of scalp - Plan: Ambulatory referral to Dermatology  -screen for mole mapping/eval of nevus on scalp.   Need for hepatitis C screening test - Plan: Hepatitis C antibody  Tinnitus   -intermittent. rtc for repeat eval and possible audiogram vs ENT eval if increases or  persistent.   No orders of the defined types were placed in this encounter.   Patient Instructions  Zyrtec for allergies, try adding nasocort or nasonex one spray per nostril each day.   See information on ringing in the ears.  If this is more frequent or worsening.   If increase or change in palpitations, or chest pains - return here or emergency room.   I will refer you for colonoscopy and dermatologist to look at scalp and bump on your neck.   You should receive a call or letter about your lab results within the next week to 10 days.      Allergic Rhinitis Allergic rhinitis is when the mucous membranes in the nose respond to allergens. Allergens are particles in the air that cause your body to have an allergic reaction. This causes you to release allergic antibodies. Through a chain of events, these eventually cause you to release histamine into the blood stream. Although meant to protect the body, it is this release of histamine that causes your discomfort, such as frequent sneezing, congestion, and an itchy, runny nose.  CAUSES Seasonal allergic rhinitis (hay fever) is caused by pollen allergens that may come from grasses, trees, and weeds. Year-round allergic rhinitis (perennial allergic rhinitis) is caused by allergens such as house dust mites, pet dander, and mold spores. SYMPTOMS  Nasal stuffiness (congestion).  Itchy, runny nose with sneezing and tearing of the  eyes. DIAGNOSIS Your health care provider can help you determine the allergen or allergens that trigger your symptoms. If you and your health care provider are unable to determine the allergen, skin or blood testing may be used. Your health care provider will diagnose your condition after taking your health history and performing a physical exam. Your health care provider may assess you for other related conditions, such as asthma, pink eye, or an ear infection. TREATMENT Allergic rhinitis does not have a cure, but it can be controlled by:  Medicines that block allergy symptoms. These may include allergy shots, nasal sprays, and oral antihistamines.  Avoiding the allergen. Hay fever may often be treated with antihistamines in pill or nasal spray forms. Antihistamines block the effects of histamine. There are over-the-counter medicines that may help with nasal congestion and swelling around the eyes. Check with your health care provider before taking or giving this medicine. If avoiding the allergen or the medicine prescribed do not work, there are many new medicines your health care provider can prescribe. Stronger medicine may be used if initial measures are ineffective. Desensitizing injections can be used if medicine and avoidance does not work. Desensitization is when a patient is given ongoing shots until the body becomes less sensitive to the allergen. Make sure you follow up with your health care provider if problems continue. HOME CARE INSTRUCTIONS It is not possible to completely avoid allergens, but you can reduce your symptoms by taking steps to limit your exposure to them. It helps to know exactly what you are allergic to so that you can avoid your specific triggers. SEEK MEDICAL CARE IF:  You have a fever.  You develop a cough that does not stop easily (persistent).  You have shortness of breath.  You start wheezing.  Symptoms interfere with normal daily activities.   This  information is not intended to replace advice given to you by your health care provider. Make sure you discuss any questions you have with your health care provider.   Document Released: 11/14/2000 Document Revised: 03/12/2014 Document  Reviewed: 10/27/2012 Elsevier Interactive Patient Education 2016 [employer].   Tinnitus Tinnitus refers to hearing a sound when there is no actual source for that sound. This is often described as ringing in the ears. However, people with this condition may hear a variety of noises. A person may hear the sound in one ear or in both ears.  The sounds of tinnitus can be soft, loud, or somewhere in between. Tinnitus can last for a few seconds or can be constant for days. It may go away without treatment and come back at various times. When tinnitus is constant or happens often, it can lead to other problems, such as trouble sleeping and trouble concentrating. Almost everyone experiences tinnitus at some point. Tinnitus that is long-lasting (chronic) or comes back often is a problem that may require medical attention.  CAUSES  The cause of tinnitus is often not known. In some cases, it can result from other problems or conditions, including:   Exposure to loud noises from machinery, music, or other sources.  Hearing loss.  Ear or sinus infections.  Earwax buildup.  A foreign object in the ear.  Use of certain medicines.  Use of alcohol and caffeine.  High blood pressure.  Heart diseases.  Anemia.  Allergies.  Meniere disease.  Thyroid problems.  Tumors.  An enlarged part of a weakened blood vessel (aneurysm). SYMPTOMS The main symptom of tinnitus is hearing a sound when there is no source for that sound. It may sound like:   Buzzing.  Roaring.  Ringing.  Blowing air, similar to the sound heard when you listen to a seashell.  Hissing.  Whistling.  Sizzling.  Humming.  Running water.  A sustained musical note. DIAGNOSIS   Tinnitus is diagnosed based on your symptoms. Your health care provider will do a physical exam. A comprehensive hearing exam (audiologic exam) will be done if your tinnitus:   Affects only one ear (unilateral).  Causes hearing difficulties.  Lasts 6 months or longer. You may also need to see a health care provider who specializes in hearing disorders (audiologist). You may be asked to complete a questionnaire to determine the severity of your tinnitus. Tests may be done to help determine the cause and to rule out other conditions. These can include:  Imaging studies of your head and brain, such as:  A CT scan.  An MRI.  An imaging study of your blood vessels (angiogram). TREATMENT  Treating an underlying medical condition can sometimes make tinnitus go away. If your tinnitus continues, other treatments may include:  Medicines, such as certain antidepressants or sleeping aids.  Sound generators to mask the tinnitus. These include:  Tabletop sound machines that play relaxing sounds to help you fall asleep.  Wearable devices that fit in your ear and play sounds or music.  A small device that uses headphones to deliver a signal embedded in music (acoustic neural stimulation). In time, this may change the pathways of your brain and make you less sensitive to tinnitus. This device is used for very severe cases when no other treatment is working.  Therapy and counseling to help you manage the stress of living with tinnitus.  Using hearing aids or cochlear implants, if your tinnitus is related to hearing loss. HOME CARE INSTRUCTIONS  When possible, avoid being in loud places and being exposed to loud sounds.  Wear hearing protection, such as earplugs, when you are exposed to loud noises.  Do not take stimulants, such as nicotine, alcohol, or  caffeine.  Practice techniques for reducing stress, such as meditation, yoga, or deep breathing.  Use a white noise machine, a humidifier,  or other devices to mask the sound of tinnitus.  Sleep with your head slightly raised. This may reduce the impact of tinnitus.  Try to get plenty of rest each night. SEEK MEDICAL CARE IF:  You have tinnitus in just one ear.  Your tinnitus continues for 3 weeks or longer without stopping.  Home care measures are not helping.  You have tinnitus after a head injury.  You have tinnitus along with any of the following:  Dizziness.  Loss of balance.  Nausea and vomiting.   This information is not intended to replace advice given to you by your health care provider. Make sure you discuss any questions you have with your health care provider.   Document Released: 02/19/2005 Document Revised: 03/12/2014 Document Reviewed: 07/22/2013 Elsevier Interactive Patient Education 2016 [employer].  Palpitations A palpitation is the feeling that your heartbeat is irregular or is faster than normal. It may feel like your heart is fluttering or skipping a beat. Palpitations are usually not a serious problem. However, in some cases, you may need further medical evaluation. CAUSES  Palpitations can be caused by:  Smoking.  Caffeine or other stimulants, such as diet pills or energy drinks.  Alcohol.  Stress and anxiety.  Strenuous physical activity.  Fatigue.  Certain medicines.  Heart disease, especially if you have a history of irregular heart rhythms (arrhythmias), such as atrial fibrillation, atrial flutter, or supraventricular tachycardia.  An improperly working pacemaker or defibrillator. DIAGNOSIS  To find the cause of your palpitations, your health care provider will take your medical history and perform a physical exam. Your health care provider may also have you take a test called an ambulatory electrocardiogram (ECG). An ECG records your heartbeat patterns over a 24-hour period. You may also have other tests, such as:  Transthoracic echocardiogram (TTE). During  echocardiography, sound waves are used to evaluate how blood flows through your heart.  Transesophageal echocardiogram (TEE).  Cardiac monitoring. This allows your health care provider to monitor your heart rate and rhythm in real time.  Holter monitor. This is a portable device that records your heartbeat and can help diagnose heart arrhythmias. It allows your health care provider to track your heart activity for several days, if needed.  Stress tests by exercise or by giving medicine that makes the heart beat faster. TREATMENT  Treatment of palpitations depends on the cause of your symptoms and can vary greatly. Most cases of palpitations do not require any treatment other than time, relaxation, and monitoring your symptoms. Other causes, such as atrial fibrillation, atrial flutter, or supraventricular tachycardia, usually require further treatment. HOME CARE INSTRUCTIONS   Avoid:  Caffeinated coffee, tea, soft drinks, diet pills, and energy drinks.  Chocolate.  Alcohol.  Stop smoking if you smoke.  Reduce your stress and anxiety. Things that can help you relax include:  A method of controlling things in your body, such as your heartbeats, with your mind (biofeedback).  Yoga.  Meditation.  Physical activity such as swimming, jogging, or walking.  Get plenty of rest and sleep. SEEK MEDICAL CARE IF:   You continue to have a fast or irregular heartbeat beyond 24 hours.  Your palpitations occur more often. SEEK IMMEDIATE MEDICAL CARE IF:  You have chest pain or shortness of breath.  You have a severe headache.  You feel dizzy or you faint. MAKE SURE  YOU:  Understand these instructions.  Will watch your condition.  Will get help right away if you are not doing well or get worse.   This information is not intended to replace advice given to you by your health care provider. Make sure you discuss any questions you have with your health care provider.   Document  Released: 02/17/2000 Document Revised: 02/24/2013 Document Reviewed: 04/20/2011 Elsevier Interactive Patient Education [employer].        By signing my name below, I, Moises Blood, attest that this documentation has been prepared under the direction and in the presence of [name], MD. Electronically Signed: Moises Blood, [place]. 02/21/2015 , 10:40 AM .  I personally performed the services described in this documentation, which was scribed in my presence. The recorded information has been reviewed and considered, and addended by me as needed.

## 2015-02-21 NOTE — Progress Notes (Signed)
   Subjective:    Patient ID: Patrick [name], male    DOB: 09/02/1960, 54 y.o.   MRN: [id number]  HPI    Review of Systems  Constitutional: Negative.   HENT: Positive for postnasal drip and tinnitus.   Eyes: Negative.   Respiratory: Negative.   Cardiovascular: Positive for palpitations.  Gastrointestinal: Positive for diarrhea.  Endocrine: Negative.   Genitourinary: Negative.   Musculoskeletal: Negative.   Skin: Negative.   Allergic/Immunologic: Positive for environmental allergies.  Neurological: Negative.   Hematological: Negative.   Psychiatric/Behavioral: Negative.        Objective:   Physical Exam        Assessment & Plan:

## 2015-02-21 NOTE — Patient Instructions (Addendum)
Zyrtec for allergies, try adding nasocort or nasonex one spray per nostril each day.   See information on ringing in the ears.  If this is more frequent or worsening.   If increase or change in palpitations, or chest pains - return here or emergency room.   I will refer you for colonoscopy and dermatologist to look at scalp and bump on your neck.   You should receive a call or letter about your lab results within the next week to 10 days.      Allergic Rhinitis Allergic rhinitis is when the mucous membranes in the nose respond to allergens. Allergens are particles in the air that cause your body to have an allergic reaction. This causes you to release allergic antibodies. Through a chain of events, these eventually cause you to release histamine into the blood stream. Although meant to protect the body, it is this release of histamine that causes your discomfort, such as frequent sneezing, congestion, and an itchy, runny nose.  CAUSES Seasonal allergic rhinitis (hay fever) is caused by pollen allergens that may come from grasses, trees, and weeds. Year-round allergic rhinitis (perennial allergic rhinitis) is caused by allergens such as house dust mites, pet dander, and mold spores. SYMPTOMS  Nasal stuffiness (congestion).  Itchy, runny nose with sneezing and tearing of the eyes. DIAGNOSIS Your health care provider can help you determine the allergen or allergens that trigger your symptoms. If you and your health care provider are unable to determine the allergen, skin or blood testing may be used. Your health care provider will diagnose your condition after taking your health history and performing a physical exam. Your health care provider may assess you for other related conditions, such as asthma, pink eye, or an ear infection. TREATMENT Allergic rhinitis does not have a cure, but it can be controlled by:  Medicines that block allergy symptoms. These may include allergy shots, nasal  sprays, and oral antihistamines.  Avoiding the allergen. Hay fever may often be treated with antihistamines in pill or nasal spray forms. Antihistamines block the effects of histamine. There are over-the-counter medicines that may help with nasal congestion and swelling around the eyes. Check with your health care provider before taking or giving this medicine. If avoiding the allergen or the medicine prescribed do not work, there are many new medicines your health care provider can prescribe. Stronger medicine may be used if initial measures are ineffective. Desensitizing injections can be used if medicine and avoidance does not work. Desensitization is when a patient is given ongoing shots until the body becomes less sensitive to the allergen. Make sure you follow up with your health care provider if problems continue. HOME CARE INSTRUCTIONS It is not possible to completely avoid allergens, but you can reduce your symptoms by taking steps to limit your exposure to them. It helps to know exactly what you are allergic to so that you can avoid your specific triggers. SEEK MEDICAL CARE IF:  You have a fever.  You develop a cough that does not stop easily (persistent).  You have shortness of breath.  You start wheezing.  Symptoms interfere with normal daily activities.   This information is not intended to replace advice given to you by your health care provider. Make sure you discuss any questions you have with your health care provider.   Document Released: 11/14/2000 Document Revised: 03/12/2014 Document Reviewed: 10/27/2012 Elsevier Interactive Patient Education 2016 [employer].   Tinnitus Tinnitus refers to hearing a sound when there  is no actual source for that sound. This is often described as ringing in the ears. However, people with this condition may hear a variety of noises. A person may hear the sound in one ear or in both ears.  The sounds of tinnitus can be soft, loud, or  somewhere in between. Tinnitus can last for a few seconds or can be constant for days. It may go away without treatment and come back at various times. When tinnitus is constant or happens often, it can lead to other problems, such as trouble sleeping and trouble concentrating. Almost everyone experiences tinnitus at some point. Tinnitus that is long-lasting (chronic) or comes back often is a problem that may require medical attention.  CAUSES  The cause of tinnitus is often not known. In some cases, it can result from other problems or conditions, including:   Exposure to loud noises from machinery, music, or other sources.  Hearing loss.  Ear or sinus infections.  Earwax buildup.  A foreign object in the ear.  Use of certain medicines.  Use of alcohol and caffeine.  High blood pressure.  Heart diseases.  Anemia.  Allergies.  Meniere disease.  Thyroid problems.  Tumors.  An enlarged part of a weakened blood vessel (aneurysm). SYMPTOMS The main symptom of tinnitus is hearing a sound when there is no source for that sound. It may sound like:   Buzzing.  Roaring.  Ringing.  Blowing air, similar to the sound heard when you listen to a seashell.  Hissing.  Whistling.  Sizzling.  Humming.  Running water.  A sustained musical note. DIAGNOSIS  Tinnitus is diagnosed based on your symptoms. Your health care provider will do a physical exam. A comprehensive hearing exam (audiologic exam) will be done if your tinnitus:   Affects only one ear (unilateral).  Causes hearing difficulties.  Lasts 6 months or longer. You may also need to see a health care provider who specializes in hearing disorders (audiologist). You may be asked to complete a questionnaire to determine the severity of your tinnitus. Tests may be done to help determine the cause and to rule out other conditions. These can include:  Imaging studies of your head and brain, such as:  A CT scan.  An  MRI.  An imaging study of your blood vessels (angiogram). TREATMENT  Treating an underlying medical condition can sometimes make tinnitus go away. If your tinnitus continues, other treatments may include:  Medicines, such as certain antidepressants or sleeping aids.  Sound generators to mask the tinnitus. These include:  Tabletop sound machines that play relaxing sounds to help you fall asleep.  Wearable devices that fit in your ear and play sounds or music.  A small device that uses headphones to deliver a signal embedded in music (acoustic neural stimulation). In time, this may change the pathways of your brain and make you less sensitive to tinnitus. This device is used for very severe cases when no other treatment is working.  Therapy and counseling to help you manage the stress of living with tinnitus.  Using hearing aids or cochlear implants, if your tinnitus is related to hearing loss. HOME CARE INSTRUCTIONS  When possible, avoid being in loud places and being exposed to loud sounds.  Wear hearing protection, such as earplugs, when you are exposed to loud noises.  Do not take stimulants, such as nicotine, alcohol, or caffeine.  Practice techniques for reducing stress, such as meditation, yoga, or deep breathing.  Use a white noise  machine, a humidifier, or other devices to mask the sound of tinnitus.  Sleep with your head slightly raised. This may reduce the impact of tinnitus.  Try to get plenty of rest each night. SEEK MEDICAL CARE IF:  You have tinnitus in just one ear.  Your tinnitus continues for 3 weeks or longer without stopping.  Home care measures are not helping.  You have tinnitus after a head injury.  You have tinnitus along with any of the following:  Dizziness.  Loss of balance.  Nausea and vomiting.   This information is not intended to replace advice given to you by your health care provider. Make sure you discuss any questions you have with  your health care provider.   Document Released: 02/19/2005 Document Revised: 03/12/2014 Document Reviewed: 07/22/2013 Elsevier Interactive Patient Education 2016 [employer].  Palpitations A palpitation is the feeling that your heartbeat is irregular or is faster than normal. It may feel like your heart is fluttering or skipping a beat. Palpitations are usually not a serious problem. However, in some cases, you may need further medical evaluation. CAUSES  Palpitations can be caused by:  Smoking.  Caffeine or other stimulants, such as diet pills or energy drinks.  Alcohol.  Stress and anxiety.  Strenuous physical activity.  Fatigue.  Certain medicines.  Heart disease, especially if you have a history of irregular heart rhythms (arrhythmias), such as atrial fibrillation, atrial flutter, or supraventricular tachycardia.  An improperly working pacemaker or defibrillator. DIAGNOSIS  To find the cause of your palpitations, your health care provider will take your medical history and perform a physical exam. Your health care provider may also have you take a test called an ambulatory electrocardiogram (ECG). An ECG records your heartbeat patterns over a 24-hour period. You may also have other tests, such as:  Transthoracic echocardiogram (TTE). During echocardiography, sound waves are used to evaluate how blood flows through your heart.  Transesophageal echocardiogram (TEE).  Cardiac monitoring. This allows your health care provider to monitor your heart rate and rhythm in real time.  Holter monitor. This is a portable device that records your heartbeat and can help diagnose heart arrhythmias. It allows your health care provider to track your heart activity for several days, if needed.  Stress tests by exercise or by giving medicine that makes the heart beat faster. TREATMENT  Treatment of palpitations depends on the cause of your symptoms and can vary greatly. Most cases of  palpitations do not require any treatment other than time, relaxation, and monitoring your symptoms. Other causes, such as atrial fibrillation, atrial flutter, or supraventricular tachycardia, usually require further treatment. HOME CARE INSTRUCTIONS   Avoid:  Caffeinated coffee, tea, soft drinks, diet pills, and energy drinks.  Chocolate.  Alcohol.  Stop smoking if you smoke.  Reduce your stress and anxiety. Things that can help you relax include:  A method of controlling things in your body, such as your heartbeats, with your mind (biofeedback).  Yoga.  Meditation.  Physical activity such as swimming, jogging, or walking.  Get plenty of rest and sleep. SEEK MEDICAL CARE IF:   You continue to have a fast or irregular heartbeat beyond 24 hours.  Your palpitations occur more often. SEEK IMMEDIATE MEDICAL CARE IF:  You have chest pain or shortness of breath.  You have a severe headache.  You feel dizzy or you faint. MAKE SURE YOU:  Understand these instructions.  Will watch your condition.  Will get help right away if you are  not doing well or get worse.   This information is not intended to replace advice given to you by your health care provider. Make sure you discuss any questions you have with your health care provider.   Document Released: 02/17/2000 Document Revised: 02/24/2013 Document Reviewed: 04/20/2011 Elsevier Interactive Patient Education [employer].

## 2015-02-22 LAB — PSA: PSA: 0.43 ng/mL (ref <=4.00)

## 2015-02-23 ENCOUNTER — Encounter: Payer: Self-pay | Admitting: Gastroenterology

## 2015-02-25 ENCOUNTER — Encounter: Payer: Self-pay | Admitting: Family Medicine

## 2015-03-08 ENCOUNTER — Encounter: Payer: Self-pay | Admitting: Family Medicine

## 2015-03-15 ENCOUNTER — Encounter: Payer: Self-pay | Admitting: Family Medicine

## 2015-04-07 ENCOUNTER — Encounter: Payer: Self-pay | Admitting: Gastroenterology

## 2016-08-13 ENCOUNTER — Emergency Department (HOSPITAL_COMMUNITY): Payer: 59

## 2016-08-13 ENCOUNTER — Encounter (HOSPITAL_COMMUNITY): Payer: Self-pay

## 2016-08-13 ENCOUNTER — Ambulatory Visit (HOSPITAL_COMMUNITY)
Admission: EM | Admit: 2016-08-13 | Discharge: 2016-08-13 | Disposition: A | Discharge status: Nursing Home (Includes ICF) | Payer: Self-pay

## 2016-08-13 ENCOUNTER — Emergency Department (HOSPITAL_COMMUNITY)
Admission: EM | Admit: 2016-08-13 | Discharge: 2016-08-13 | Disposition: A | Discharge status: Home / Self Care | Payer: 59 | Attending: Emergency Medicine | Admitting: Emergency Medicine

## 2016-08-13 DIAGNOSIS — N200 Calculus of kidney: Secondary | ICD-10-CM | POA: Diagnosis not present

## 2016-08-13 DIAGNOSIS — Z79899 Other long term (current) drug therapy: Secondary | ICD-10-CM | POA: Diagnosis not present

## 2016-08-13 DIAGNOSIS — Z951 Presence of aortocoronary bypass graft: Secondary | ICD-10-CM | POA: Diagnosis not present

## 2016-08-13 DIAGNOSIS — Z7982 Long term (current) use of aspirin: Secondary | ICD-10-CM | POA: Diagnosis not present

## 2016-08-13 DIAGNOSIS — R1030 Lower abdominal pain, unspecified: Secondary | ICD-10-CM | POA: Diagnosis present

## 2016-08-13 LAB — COMPREHENSIVE METABOLIC PANEL
ALK PHOS: 61 U/L (ref 38–126)
ALT: 29 U/L (ref 17–63)
AST: 31 U/L (ref 15–41)
Albumin: 4.4 g/dL (ref 3.5–5.0)
Anion gap: 12 (ref 5–15)
BUN: 15 mg/dL (ref 6–20)
CALCIUM: 9.1 mg/dL (ref 8.9–10.3)
CHLORIDE: 105 mmol/L (ref 101–111)
CO2: 20 mmol/L — AB (ref 22–32)
CREATININE: 1.32 mg/dL — AB (ref 0.61–1.24)
GFR calc non Af Amer: 59 mL/min — ABNORMAL LOW (ref >60)
GLUCOSE: 141 mg/dL — AB (ref 65–99)
Potassium: 3.9 mmol/L (ref 3.5–5.1)
SODIUM: 137 mmol/L (ref 135–145)
Total Bilirubin: 0.7 mg/dL (ref 0.3–1.2)
Total Protein: 7.2 g/dL (ref 6.5–8.1)

## 2016-08-13 LAB — CBC
HCT: 45.4 % (ref 39.0–52.0)
Hemoglobin: 15.4 g/dL (ref 13.0–17.0)
MCH: 29.4 pg (ref 26.0–34.0)
MCHC: 33.9 g/dL (ref 30.0–36.0)
MCV: 86.6 fL (ref 78.0–100.0)
PLATELETS: 308 10*3/uL (ref 150–400)
RBC: 5.24 MIL/uL (ref 4.22–5.81)
RDW: 12.4 % (ref 11.5–15.5)
WBC: 16.1 10*3/uL — ABNORMAL HIGH (ref 4.0–10.5)

## 2016-08-13 LAB — URINALYSIS, ROUTINE W REFLEX MICROSCOPIC
BILIRUBIN URINE: NEGATIVE
GLUCOSE, UA: 50 mg/dL — AB
HGB URINE DIPSTICK: NEGATIVE
Ketones, ur: 5 mg/dL — AB
Leukocytes, UA: NEGATIVE
Nitrite: NEGATIVE
PROTEIN: NEGATIVE mg/dL
Specific Gravity, Urine: 1.011 (ref 1.005–1.030)
pH: 6 (ref 5.0–8.0)

## 2016-08-13 LAB — LIPASE, BLOOD: LIPASE: 31 U/L (ref 11–51)

## 2016-08-13 MED ORDER — ONDANSETRON 8 MG PO TBDP: 8.0000 mg | ORAL_TABLET | Freq: Three times a day (TID) | ORAL | 0 refills | Status: DC | PRN

## 2016-08-13 MED ORDER — KETOROLAC TROMETHAMINE 30 MG/ML IJ SOLN
30.0000 mg | Freq: Once | INTRAMUSCULAR | Status: AC
  Administered 2016-08-13: 30 mg via INTRAVENOUS
  Filled 2016-08-13: qty 1

## 2016-08-13 MED ORDER — SODIUM CHLORIDE 0.9 % IV BOLUS (SEPSIS)
500.0000 mL | Freq: Once | INTRAVENOUS | Status: AC
  Administered 2016-08-13: 500 mL via INTRAVENOUS

## 2016-08-13 MED ORDER — TAMSULOSIN HCL 0.4 MG PO CAPS: 0.4000 mg | ORAL_CAPSULE | Freq: Every day | ORAL | 0 refills | Status: DC

## 2016-08-13 MED ORDER — OXYCODONE-ACETAMINOPHEN 5-325 MG PO TABS: 1.0000 | ORAL_TABLET | ORAL | 0 refills | Status: DC | PRN

## 2016-08-13 MED ORDER — HYDROMORPHONE HCL 1 MG/ML IJ SOLN
1.0000 mg | Freq: Once | INTRAMUSCULAR | Status: AC
  Administered 2016-08-13: 1 mg via INTRAVENOUS
  Filled 2016-08-13: qty 1

## 2016-08-13 MED ORDER — KETOROLAC TROMETHAMINE 30 MG/ML IJ SOLN
15.0000 mg | Freq: Once | INTRAMUSCULAR | Status: AC
  Administered 2016-08-13: 15 mg via INTRAVENOUS
  Filled 2016-08-13: qty 1

## 2016-08-13 MED ORDER — ONDANSETRON HCL 4 MG/2ML IJ SOLN
4.0000 mg | Freq: Once | INTRAMUSCULAR | Status: AC
Start: 2016-08-13 — End: 2016-08-13
  Administered 2016-08-13: 4 mg via INTRAVENOUS
  Filled 2016-08-13: qty 2

## 2016-08-13 MED ORDER — MORPHINE SULFATE (PF) 4 MG/ML IV SOLN
4.0000 mg | Freq: Once | INTRAVENOUS | Status: AC
  Administered 2016-08-13: 4 mg via INTRAVENOUS
  Filled 2016-08-13: qty 1

## 2016-08-13 NOTE — Discharge Instructions (Signed)
You have a very small kidney stone that is in the ureter near the bladder. Prescription for pain medicine, nausea medicine, medication to increase urinary flow. Phone number for urology given.

## 2016-08-13 NOTE — ED Triage Notes (Signed)
Pt states he woke up this morning with pain in his lower abdomen. Pt denies n/v/d. Pt also reports pain has radiates over to the left flank area. Pt appears pale and in sever pain in triage.

## 2016-08-13 NOTE — ED Notes (Signed)
PT states understanding of care given, follow up care, and medication prescribed. PT ambulated from ED to car with a steady gait. 

## 2016-08-13 NOTE — ED Provider Notes (Signed)
[facility] DEPT Provider Note   CSN: [id number] Arrival date & time: 08/13/16  1349     History   Chief Complaint Chief Complaint  Patient presents with  . Abdominal Pain    HPI Patrick [name] is a 56 y.o. male.  Patient presents with suprapubic pain radiating to the left flank since early this morning. No dysuria, hematuria, fever, chills. No history of kidney stones. He has tried nothing at home for the pain. Severity is moderate to severe.      Past Medical History:  Diagnosis Date  . Allergy   . Heart murmur     Patient Active Problem List   Diagnosis Date Noted  . Allergic rhinitis 02/21/2015    Past Surgical History:  Procedure Laterality Date  . APPENDECTOMY    . Arthroscopic knee surgeries on both knees    . CHOLECYSTECTOMY    . CORONARY ARTERY BYPASS GRAFT    . VASECTOMY         Home Medications    Prior to Admission medications   Medication Sig Start Date End Date Taking? Authorizing Provider  aspirin EC 81 MG tablet Take 81 mg by mouth at bedtime.   Yes [provider]  cetirizine (ZYRTEC) 10 MG tablet Take 10 mg by mouth at bedtime.    Yes [provider]  diphenhydrAMINE (BENADRYL) 25 MG tablet Take 25 mg by mouth 2 (two) times daily.    Yes [provider]  ibuprofen (ADVIL,MOTRIN) 200 MG tablet Take 400-600 mg by mouth every 6 (six) hours as needed for headache (pain).   Yes [provider]  Inositol Niacinate (NIACIN FLUSH FREE) 500 MG CAPS Take 500 mg by mouth 2 (two) times daily.   Yes [provider]  Javier Docker Oil 300 MG CAPS Take 300 mg by mouth at bedtime.    Yes [provider]  Multiple Vitamin (MULTIVITAMIN WITH MINERALS) TABS tablet Take 1 tablet by mouth daily. Men's Over 54   Yes [provider]  Turmeric Curcumin 500 MG CAPS Take 500 mg by mouth daily.   Yes [provider]  ondansetron (ZOFRAN ODT) 8 MG disintegrating tablet Take 1 tablet (8 mg total) by mouth  every 8 (eight) hours as needed for nausea or vomiting. 08/13/16   Nat Christen, MD  oxyCODONE-acetaminophen (PERCOCET) 5-325 MG tablet Take 1-2 tablets by mouth every 4 (four) hours as needed. 08/13/16   Nat Christen, MD  tamsulosin (FLOMAX) 0.4 MG CAPS capsule Take 1 capsule (0.4 mg total) by mouth daily. 08/13/16   Nat Christen, MD    Family History Family History  Problem Relation Age of Onset  . Adopted: Yes    Social History Social History  Substance Use Topics  . Smoking status: Never Smoker  . Smokeless tobacco: Never Used  . Alcohol use 3.0 oz/week    5 Standard drinks or equivalent per week     Allergies   Penicillins   Review of Systems Review of Systems  All other systems reviewed and are negative.    Physical Exam Updated Vital Signs BP 125/78   Pulse 91   Temp 99 F (37.2 C) (Axillary)   Resp (!) 8   SpO2 94%   Physical Exam  Constitutional: He is oriented to person, place, and time. He appears well-developed and well-nourished.  HENT:  Head: Normocephalic and atraumatic.  Eyes: Conjunctivae are normal.  Neck: Neck supple.  Cardiovascular: Normal rate and regular rhythm.   Pulmonary/Chest: Effort normal and breath  sounds normal.  Abdominal:  Minimal tenderness left suprapubic area  Musculoskeletal: Normal range of motion.  Neurological: He is alert and oriented to person, place, and time.  Skin: Skin is warm and dry.  Psychiatric: He has a normal mood and affect. His behavior is normal.  Nursing note and vitals reviewed.    ED Treatments / Results  Labs (all labs ordered are listed, but only abnormal results are displayed) Labs Reviewed  COMPREHENSIVE METABOLIC PANEL - Abnormal; Notable for the following:       Result Value   CO2 20 (*)    Glucose, Bld 141 (*)    Creatinine, Ser 1.32 (*)    GFR calc non Af Amer 59 (*)    All other components within normal limits  CBC - Abnormal; Notable for the following:    WBC 16.1 (*)    All other  components within normal limits  URINALYSIS, ROUTINE W REFLEX MICROSCOPIC - Abnormal; Notable for the following:    Color, Urine STRAW (*)    Glucose, UA 50 (*)    Ketones, ur 5 (*)    All other components within normal limits  LIPASE, BLOOD    EKG  EKG Interpretation None       Radiology Ct Renal Stone Study  Result Date: 08/13/2016 CLINICAL DATA:  Left lower quadrant pain. Prior appendectomy and cholecystectomy. EXAM: CT ABDOMEN AND PELVIS WITHOUT CONTRAST TECHNIQUE: Multidetector CT imaging of the abdomen and pelvis was performed following the standard protocol without IV contrast. COMPARISON:  None. FINDINGS: Lower chest:  Subsegmental atelectasis noted in the lung bases. Hepatobiliary: The liver shows diffusely decreased attenuation suggesting steatosis. Gallbladder surgically absent. No intrahepatic or extrahepatic biliary dilation. Pancreas: No focal mass lesion. No dilatation of the main duct. No intraparenchymal cyst. No peripancreatic edema. Spleen: No splenomegaly. No focal mass lesion. Adrenals/Urinary Tract: No adrenal nodule or mass. 4 mm nonobstructing stone identified lower pole right kidney. No right ureteral stones. No secondary changes in the right kidney or ureter. 4 mm nonobstructing stone identified in the interpolar left kidney. Mild fullness noted in the left intrarenal collecting system and ureter. 1 x 2 mm stone is identified in the left UVJ. No stones free within the urinary bladder. Stomach/Bowel: Stomach is nondistended. No gastric wall thickening. No evidence of outlet obstruction. Duodenum is normally positioned as is the ligament of Treitz. No small bowel wall thickening. No small bowel dilatation. The terminal ileum is normal. Nonvisualization of the appendix is consistent with the reported history of appendectomy. No gross colonic mass. No colonic wall thickening. No substantial diverticular change. Vascular/Lymphatic: There is abdominal aortic atherosclerosis  without aneurysm. There is no gastrohepatic or hepatoduodenal ligament lymphadenopathy. No intraperitoneal or retroperitoneal lymphadenopathy. No pelvic sidewall lymphadenopathy. Reproductive: The prostate gland and seminal vesicles have normal imaging features. Other: No intraperitoneal free fluid. Musculoskeletal: Bone windows reveal no worrisome lytic or sclerotic osseous lesions. IMPRESSION: 1. 1 x 2 mm stone identified at the left UVJ causes mild fullness of the left ureter and intrarenal collecting system. 2. Tiny bilateral nonobstructing renal stones. Abdominal Aortic Atherosclerois (ICD10-170.0) Electronically Signed   By: Misty Stanley M.D.   On: 08/13/2016 17:22    Procedures Procedures (including critical care time)  Medications Ordered in ED Medications  sodium chloride 0.9 % bolus 500 mL (0 mLs Intravenous Stopped 08/13/16 1852)  ondansetron (ZOFRAN) injection 4 mg (4 mg Intravenous Given 08/13/16 1533)  ketorolac (TORADOL) 30 MG/ML injection 30 mg (30 mg Intravenous Given 08/13/16 1530)  HYDROmorphone (DILAUDID) injection 1 mg (1 mg Intravenous Given 08/13/16 1530)  ketorolac (TORADOL) 30 MG/ML injection 15 mg (15 mg Intravenous Given 08/13/16 1849)  morphine 4 MG/ML injection 4 mg (4 mg Intravenous Given 08/13/16 1848)     Initial Impression / Assessment and Plan / ED Course  I have reviewed the triage vital signs and the nursing notes.  Pertinent labs & imaging results that were available during my care of the patient were reviewed by me and considered in my medical decision making (see chart for details).     History and physical consistent with kidney stone. CT renal confirms same. Results of test discussed with patient. He is feeling much better at discharge. Discharge medications Percocet, Zofran 8 mg, Flomax 0.4 mg  Final Clinical Impressions(s) / ED Diagnoses   Final diagnoses:  Kidney stone on left side    New Prescriptions New Prescriptions   ONDANSETRON (ZOFRAN  ODT) 8 MG DISINTEGRATING TABLET    Take 1 tablet (8 mg total) by mouth every 8 (eight) hours as needed for nausea or vomiting.   OXYCODONE-ACETAMINOPHEN (PERCOCET) 5-325 MG TABLET    Take 1-2 tablets by mouth every 4 (four) hours as needed.   TAMSULOSIN (FLOMAX) 0.4 MG CAPS CAPSULE    Take 1 capsule (0.4 mg total) by mouth daily.     Nat Christen, MD 08/13/16 [phone]

## 2017-04-29 ENCOUNTER — Encounter: Payer: Self-pay | Admitting: Family Medicine

## 2017-05-02 ENCOUNTER — Telehealth: Payer: Self-pay

## 2017-05-02 NOTE — Telephone Encounter (Signed)
Pt's email states they want to come in prior to appointment for labs. Message sent to Dr. Carlota Raspberry for input of orders.  Pt to call for appt

## 2017-05-04 ENCOUNTER — Other Ambulatory Visit: Payer: Self-pay | Admitting: Family Medicine

## 2017-05-04 DIAGNOSIS — Z131 Encounter for screening for diabetes mellitus: Secondary | ICD-10-CM

## 2017-05-04 DIAGNOSIS — E785 Hyperlipidemia, unspecified: Secondary | ICD-10-CM

## 2017-05-04 DIAGNOSIS — Z125 Encounter for screening for malignant neoplasm of prostate: Secondary | ICD-10-CM

## 2017-05-04 DIAGNOSIS — Z1329 Encounter for screening for other suspected endocrine disorder: Secondary | ICD-10-CM

## 2017-05-04 NOTE — Progress Notes (Signed)
Future lab orders placed

## 2017-05-07 ENCOUNTER — Ambulatory Visit (INDEPENDENT_AMBULATORY_CARE_PROVIDER_SITE_OTHER): Payer: BLUE CROSS/BLUE SHIELD | Admitting: Family Medicine

## 2017-05-07 DIAGNOSIS — Z125 Encounter for screening for malignant neoplasm of prostate: Secondary | ICD-10-CM

## 2017-05-07 DIAGNOSIS — E785 Hyperlipidemia, unspecified: Secondary | ICD-10-CM

## 2017-05-07 DIAGNOSIS — Z1329 Encounter for screening for other suspected endocrine disorder: Secondary | ICD-10-CM | POA: Diagnosis not present

## 2017-05-07 DIAGNOSIS — Z131 Encounter for screening for diabetes mellitus: Secondary | ICD-10-CM

## 2017-05-07 NOTE — Progress Notes (Signed)
Lab visit only.  Provider did not see pt at this visit.

## 2017-05-08 LAB — COMPREHENSIVE METABOLIC PANEL
A/G RATIO: 1.8 (ref 1.2–2.2)
ALBUMIN: 4.3 g/dL (ref 3.5–5.5)
ALT: 30 IU/L (ref 0–44)
AST: 26 IU/L (ref 0–40)
Alkaline Phosphatase: 64 IU/L (ref 39–117)
BILIRUBIN TOTAL: 0.4 mg/dL (ref 0.0–1.2)
BUN / CREAT RATIO: 17 (ref 9–20)
BUN: 16 mg/dL (ref 6–24)
CALCIUM: 9.2 mg/dL (ref 8.7–10.2)
CHLORIDE: 103 mmol/L (ref 96–106)
CO2: 26 mmol/L (ref 20–29)
Creatinine, Ser: 0.96 mg/dL (ref 0.76–1.27)
GFR, EST AFRICAN AMERICAN: 102 mL/min/{1.73_m2} (ref >59)
GFR, EST NON AFRICAN AMERICAN: 88 mL/min/{1.73_m2} (ref >59)
GLUCOSE: 93 mg/dL (ref 65–99)
Globulin, Total: 2.4 g/dL (ref 1.5–4.5)
Potassium: 4.4 mmol/L (ref 3.5–5.2)
Sodium: 141 mmol/L (ref 134–144)
TOTAL PROTEIN: 6.7 g/dL (ref 6.0–8.5)

## 2017-05-08 LAB — LIPID PANEL
Chol/HDL Ratio: 4.2 ratio (ref 0.0–5.0)
Cholesterol, Total: 187 mg/dL (ref 100–199)
HDL: 45 mg/dL (ref >39)
LDL Calculated: 111 mg/dL — ABNORMAL HIGH (ref 0–99)
Triglycerides: 156 mg/dL — ABNORMAL HIGH (ref 0–149)
VLDL CHOLESTEROL CAL: 31 mg/dL (ref 5–40)

## 2017-05-08 LAB — TSH: TSH: 1.92 u[IU]/mL (ref 0.450–4.500)

## 2017-05-08 LAB — PSA: Prostate Specific Ag, Serum: 0.4 ng/mL (ref 0.0–4.0)

## 2017-05-09 DIAGNOSIS — H9312 Tinnitus, left ear: Secondary | ICD-10-CM | POA: Insufficient documentation

## 2017-05-09 DIAGNOSIS — R0683 Snoring: Secondary | ICD-10-CM | POA: Insufficient documentation

## 2017-05-13 ENCOUNTER — Encounter: Payer: Self-pay | Admitting: Family Medicine

## 2017-05-13 ENCOUNTER — Other Ambulatory Visit: Payer: Self-pay

## 2017-05-13 ENCOUNTER — Ambulatory Visit (INDEPENDENT_AMBULATORY_CARE_PROVIDER_SITE_OTHER): Payer: BLUE CROSS/BLUE SHIELD | Admitting: Family Medicine

## 2017-05-13 VITALS — BP 138/90 | HR 113 | Temp 98.8°F | Resp 18 | Ht 68.5 in | Wt 243.6 lb

## 2017-05-13 DIAGNOSIS — Z125 Encounter for screening for malignant neoplasm of prostate: Secondary | ICD-10-CM | POA: Diagnosis not present

## 2017-05-13 DIAGNOSIS — Z0000 Encounter for general adult medical examination without abnormal findings: Secondary | ICD-10-CM | POA: Diagnosis not present

## 2017-05-13 DIAGNOSIS — Z23 Encounter for immunization: Secondary | ICD-10-CM | POA: Diagnosis not present

## 2017-05-13 DIAGNOSIS — Z1211 Encounter for screening for malignant neoplasm of colon: Secondary | ICD-10-CM | POA: Diagnosis not present

## 2017-05-13 DIAGNOSIS — R000 Tachycardia, unspecified: Secondary | ICD-10-CM | POA: Diagnosis not present

## 2017-05-13 MED ORDER — ZOSTER VAC RECOMB ADJUVANTED 50 MCG/0.5ML IM SUSR: 0.5000 mL | Freq: Once | INTRAMUSCULAR | 1 refills | Status: AC

## 2017-05-13 NOTE — Patient Instructions (Addendum)
I referred you for colon cancer screening. I would recommend following up with dermatology to evaluate moles: [phone].   Shingles vaccine sent to your pharmacy.   Cholesterol elevated, but at that level would work on diet and exercise approach initially. Recheck weight an labs in 6 months.   Cutting back on caffeine may be helpful on fast heart rate. If any worsening of fast heart rate, or associated symptoms such as lightheadedness - return for recheck or go to an emergency room.    Return to the clinic or go to the nearest emergency room if any of your symptoms worsen or new symptoms occur.  Keeping you healthy  Get these tests  Blood pressure- Have your blood pressure checked once a year by your healthcare provider.  Normal blood pressure is 120/80  Weight- Have your body mass index (BMI) calculated to screen for obesity.  BMI is a measure of body fat based on height and weight. You can also calculate your own BMI at ViewBanking.[email or web address].  Cholesterol- Have your cholesterol checked every year.  Diabetes- Have your blood sugar checked regularly if you have high blood pressure, high cholesterol, have a family history of diabetes or if you are overweight.  Screening for Colon Cancer- Colonoscopy starting at age 55.  Screening may begin sooner depending on your family history and other health conditions. Follow up colonoscopy as directed by your Gastroenterologist.  Screening for Prostate Cancer- Both blood work (PSA) and a rectal exam help screen for Prostate Cancer.  Screening begins at age 15 with African-American men and at age 55 with Caucasian men.  Screening may begin sooner depending on your family history.  Take these medicines  Aspirin- One aspirin daily can help prevent Heart disease and Stroke.  Flu shot- Every fall.  Tetanus- Every 10 years.  Zostavax- Once after the age of 25 to prevent Shingles.  Pneumonia shot- Once after the age of 25; if you are younger  than 15, ask your healthcare provider if you need a Pneumonia shot.  Take these steps  Don't smoke- If you do smoke, talk to your doctor about quitting.  For tips on how to quit, go to www.smokefree.gov or call 1-800-QUIT-NOW.  Be physically active- Exercise 4-5 days a week for at least 30 minutes.  If you are not already physically active start slow and gradually work up to 30 minutes of moderate physical activity.  Examples of moderate activity include walking briskly, mowing the yard, dancing, swimming, bicycling, etc.  Eat a healthy diet- Eat a variety of healthy food such as fruits, vegetables, low fat milk, low fat cheese, yogurt, lean meant, poultry, fish, beans, tofu, etc. For more information go to www.thenutritionsource.org  Drink alcohol in moderation- Limit alcohol intake to less than two drinks a day. Never drink and drive.  Dentist- Brush and floss twice daily; visit your dentist twice a year.  Depression- Your emotional health is as important as your physical health. If you're feeling down, or losing interest in things you would normally enjoy please talk to your healthcare provider.  Eye exam- Visit your eye doctor every year.  Safe sex- If you may be exposed to a sexually transmitted infection, use a condom.  Seat belts- Seat belts can save your life; always wear one.  Smoke/Carbon Monoxide detectors- These detectors need to be installed on the appropriate level of your home.  Replace batteries at least once a year.  Skin cancer- When out in the sun, cover up and use sunscreen  15 SPF or higher.  Violence- If anyone is threatening you, please tell your healthcare provider.  Living Will/ Health care power of attorney- Speak with your healthcare provider and family.  IF you received an x-ray today, you will receive an invoice from [facility]. Please contact [facility] at [phone] with questions or concerns regarding your invoice.   IF you received  labwork today, you will receive an invoice from [facility]. Please contact [facility] at [phone] with questions or concerns regarding your invoice.   Our billing staff will not be able to assist you with questions regarding bills from these companies.  You will be contacted with the lab results as soon as they are available. The fastest way to get your results is to activate your My Chart account. Instructions are located on the last page of this paperwork. If you have not heard from [place] regarding the results in 2 weeks, please contact this office.

## 2017-05-13 NOTE — Progress Notes (Addendum)
Subjective:  By signing my name below, I, Patrick [name], attest that this documentation has been prepared under the direction and in the presence of Patrick Ray, MD. Electronically Signed: Moises [name], [place]. 05/13/2017 , 2:36 PM .  Patient was seen in Room 11 .   Patient ID: Patrick [name], male    DOB: 1960-12-05, 57 y.o.   MRN: [id number] Chief Complaint  Patient presents with  . Annual Exam   HPI Patrick [name] is a 57 y.o. male Here for annual physical. His last physical was in 2016. He informs switching insurance since then, so hasn't been able to come in for physical. He has a history of allergic rhinitis and episodic tinnitus. He mentions drinking about 2 large cups of coffee a day. He had labs drawn a week ago. He denies lightheadedness, dizziness, headache, or [name] in stool.   Cancer Screening Colonoscopy: recommended/discussed in 2016; referral sent in today.  Prostate cancer screening: [name] work done few days ago. PSA 0.4 Lab Results  Component Value Date   PSA 0.43 02/21/2015    Skin cancer screening: hasn't seen dermatologist recently. He had a couple discolored areas over his scalp and his left shoulder that removed and tested negative. He states still have a few areas including cyst on the back of his neck and a few moles over his right flank. He's adopted, so doesn't have a lot of known family history.   Immunizations Immunization History  Administered Date(s) Administered  . Tdap 02/21/2015   Shingles vaccine (Shingrix): prescription sent into pharmacy.  Flu vaccine: didn't receive it this season; denies flu shot today.   Depression Depression screen [facility] 2/9 05/13/2017 02/21/2015 12/15/2014 11/11/2014  Decreased Interest 0 0 0 0  Down, Depressed, Hopeless 0 0 0 0  PHQ - 2 Score 0 0 0 0     Vision  Visual Acuity Screening   Right eye Left eye Both eyes  Without correction:     With correction: 20/20 20/15-1 20/15   Last seen in January 2019.    Dentist Last seen dentist a week ago.   Exercise He states going to the gym about twice a week at [employer]. He does about 5 minutes warm up on the treadmill, then various weight machines, and finishes with 30 minutes on the elliptical. He informs being at the gym about 2.5 hours a day at the gym.   Body mass index is 36.5 kg/m.  Wt Readings from Last 3 Encounters:  05/13/17 243 lb 9.6 oz (110.5 kg)  02/21/15 234 lb 3.2 oz (106.2 kg)  12/15/14 235 lb (106.6 kg)   Lipid panel screening Lab Results  Component Value Date   CHOL 187 05/07/2017   HDL 45 05/07/2017   LDLCALC 111 (H) 05/07/2017   TRIG 156 (H) 05/07/2017   CHOLHDL 4.2 05/07/2017   The 10-year ASCVD risk score Mikey Bussing DC Jr., et al., 2013) is: 7.1%   Values used to calculate the score:     Age: 57 years     Sex: Male     Is Non-Hispanic African American: No     Diabetic: No     Tobacco smoker: No     Systolic [name] Pressure: 542 mmHg     Is BP treated: No     HDL Cholesterol: 45 mg/dL     Total Cholesterol: 187 mg/dL  He denies eating much fast foods.    Patient Active Problem List   Diagnosis Date Noted  . Allergic rhinitis  02/21/2015   Past Medical History:  Diagnosis Date  . Allergy   . Heart murmur    Past Surgical History:  Procedure Laterality Date  . APPENDECTOMY    . Arthroscopic knee surgeries on both knees    . CHOLECYSTECTOMY    . CORONARY ARTERY BYPASS GRAFT    . VASECTOMY     Allergies  Allergen Reactions  . Penicillins Hives    Rash around neck  Has patient had a PCN reaction causing immediate rash, facial/tongue/throat swelling, SOB or lightheadedness with hypotension: Yes Has patient had a PCN reaction causing severe rash involving mucus membranes or skin necrosis: No Has patient had a PCN reaction that required hospitalization: No Has patient had a PCN reaction occurring within the last 10 years: No If all of the above answers are "NO", then may proceed with Cephalosporin  use.   Prior to Admission medications   Medication Sig Start Date End Date Taking? Authorizing Provider  aspirin EC 81 MG tablet Take 81 mg by mouth at bedtime.    [provider]  cetirizine (ZYRTEC) 10 MG tablet Take 10 mg by mouth at bedtime.     [provider]  diphenhydrAMINE (BENADRYL) 25 MG tablet Take 25 mg by mouth 2 (two) times daily.     [provider]  ibuprofen (ADVIL,MOTRIN) 200 MG tablet Take 400-600 mg by mouth every 6 (six) hours as needed for headache (pain).    [provider]  Inositol Niacinate (NIACIN FLUSH FREE) 500 MG CAPS Take 500 mg by mouth 2 (two) times daily.    [provider]  Javier Docker Oil 300 MG CAPS Take 300 mg by mouth at bedtime.     [provider]  Multiple Vitamin (MULTIVITAMIN WITH MINERALS) TABS tablet Take 1 tablet by mouth daily. Men's Over 50    [provider]  ondansetron (ZOFRAN ODT) 8 MG disintegrating tablet Take 1 tablet (8 mg total) by mouth every 8 (eight) hours as needed for nausea or vomiting. 08/13/16   Nat Christen, MD  oxyCODONE-acetaminophen (PERCOCET) 5-325 MG tablet Take 1-2 tablets by mouth every 4 (four) hours as needed. 08/13/16   Nat Christen, MD  tamsulosin (FLOMAX) 0.4 MG CAPS capsule Take 1 capsule (0.4 mg total) by mouth daily. 08/13/16   Nat Christen, MD  Turmeric Curcumin 500 MG CAPS Take 500 mg by mouth daily.    [provider]   Social History   Socioeconomic History  . Marital status: Married    Spouse name: Not on file  . Number of children: Not on file  . Years of education: Not on file  . Highest education level: Not on file  Social Needs  . Financial resource strain: Not on file  . Food insecurity - worry: Not on file  . Food insecurity - inability: Not on file  . Transportation needs - medical: Not on file  . Transportation needs - non-medical: Not on file  Occupational History  . Occupation: Print production planner, Financial controller, Semi-retired  Tobacco Use  .  Smoking status: Never Smoker  . Smokeless tobacco: Never Used  Substance and Sexual Activity  . Alcohol use: Yes    Alcohol/week: 3.0 oz    Types: 5 Standard drinks or equivalent per week  . Drug use: No  . Sexual activity: Yes  Other Topics Concern  . Not on file  Social History Narrative   Married.   Education: College   Exercise: Yes   Review of Systems 13 point  ROS - positive palpitations and episodic tinnitus     Objective:   Physical Exam  Constitutional: He is oriented to person, place, and time. He appears well-developed and well-nourished.  HENT:  Head: Normocephalic and atraumatic.  Right Ear: External ear normal.  Left Ear: External ear normal.  Mouth/Throat: Oropharynx is clear and moist.  Eyes: Conjunctivae and EOM are normal. Pupils are equal, round, and reactive to light.  Neck: Normal range of motion. Neck supple. No thyromegaly present.  Cardiovascular: Regular rhythm, normal heart sounds and intact distal pulses. Tachycardia present.  Mildly tachycardic  Pulmonary/Chest: Effort normal and breath sounds normal. No respiratory distress. He has no wheezes.  Abdominal: Soft. He exhibits no distension. There is no tenderness. Hernia confirmed negative in the right inguinal area and confirmed negative in the left inguinal area.  Genitourinary: Prostate normal.  Musculoskeletal: Normal range of motion. He exhibits no edema or tenderness.  Lymphadenopathy:    He has no cervical adenopathy.  Neurological: He is alert and oriented to person, place, and time. He has normal reflexes.  Skin: Skin is warm and dry.  Psychiatric: He has a normal mood and affect. His behavior is normal.  Vitals reviewed.   Vitals:   05/13/17 1357  BP: 138/90  Pulse: (!) 113  Resp: 18  Temp: 98.8 F (37.1 C)  TempSrc: Oral  SpO2: 94%  Weight: 243 lb 9.6 oz (110.5 kg)  Height: 5' 8.5" (1.74 m)   Results for orders placed or performed in visit on 05/07/17  PSA  Result Value Ref  Range   Prostate Specific Ag, Serum 0.4 0.0 - 4.0 ng/mL  Comprehensive metabolic panel  Result Value Ref Range   Glucose 93 65 - 99 mg/dL   BUN 16 6 - 24 mg/dL   Creatinine, Ser 0.96 0.76 - 1.27 mg/dL   GFR calc non Af Amer 88 >59 mL/min/1.73   GFR calc Af Amer 102 >59 mL/min/1.73   BUN/Creatinine Ratio 17 9 - 20   Sodium 141 134 - 144 mmol/L   Potassium 4.4 3.5 - 5.2 mmol/L   Chloride 103 96 - 106 mmol/L   CO2 26 20 - 29 mmol/L   Calcium 9.2 8.7 - 10.2 mg/dL   Total Protein 6.7 6.0 - 8.5 g/dL   Albumin 4.3 3.5 - 5.5 g/dL   Globulin, Total 2.4 1.5 - 4.5 g/dL   Albumin/Globulin Ratio 1.8 1.2 - 2.2   Bilirubin Total 0.4 0.0 - 1.2 mg/dL   Alkaline Phosphatase 64 39 - 117 IU/L   AST 26 0 - 40 IU/L   ALT 30 0 - 44 IU/L  Lipid panel  Result Value Ref Range   Cholesterol, Total 187 100 - 199 mg/dL   Triglycerides 156 (H) 0 - 149 mg/dL   HDL 45 >39 mg/dL   VLDL Cholesterol Cal 31 5 - 40 mg/dL   LDL Calculated 111 (H) 0 - 99 mg/dL   Chol/HDL Ratio 4.2 0.0 - 5.0 ratio  TSH  Result Value Ref Range   TSH 1.920 0.450 - 4.500 uIU/mL   [3:08PM] EKG: sinus rhythm rate 92, no concerning findings.     Assessment & Plan:   Patrick [name] is a 57 y.o. male Annual physical exam  - -anticipatory guidance as below in AVS, screening labs above. Health maintenance items as above in HPI discussed/recommended as applicable.    -exercise discussed with less total time, with spreading out more days per week.   - diet/exercise approach to lipids with  recheck in 6 months.   Screening for prostate cancer We discussed pros and cons of prostate cancer screening, and after this discussion, he chose to have screening done. PSA obtained prior reassuring.  no concerning findings on DRE.   Screen for colon cancer - Plan: Ambulatory referral to Gastroenterology  Need for shingles vaccine - Plan: Zoster Vaccine Adjuvanted [facility]) injection  Tachycardia - Plan: EKG 12-Lead  - borderline tachycardia,  asymptomatic and reassuring EKG. rtc precautions. Adjust caffeine intake.   Meds ordered this encounter  Medications  . Zoster Vaccine Adjuvanted [facility]) injection    Sig: Inject 0.5 mLs into the muscle once for 1 dose. Repeat in 2-6 months.    Dispense:  0.5 mL    Refill:  1   Patient Instructions    I referred you for colon cancer screening. I would recommend following up with dermatology to evaluate moles: [phone].   Shingles vaccine sent to your pharmacy.   Cholesterol elevated, but at that level would work on diet and exercise approach initially. Recheck weight an labs in 6 months.   Cutting back on caffeine may be helpful on fast heart rate. If any worsening of fast heart rate, or associated symptoms such as lightheadedness - return for recheck or go to an emergency room.    Return to the clinic or go to the nearest emergency room if any of your symptoms worsen or new symptoms occur.  Keeping you healthy  Get these tests  [name] pressure- Have your [name] pressure checked once a year by your healthcare provider.  Normal [name] pressure is 120/80  Weight- Have your body mass index (BMI) calculated to screen for obesity.  BMI is a measure of body fat based on height and weight. You can also calculate your own BMI at [employer].  Cholesterol- Have your cholesterol checked every year.  Diabetes- Have your [name] sugar checked regularly if you have high [name] pressure, high cholesterol, have a family history of diabetes or if you are overweight.  Screening for Colon Cancer- Colonoscopy starting at age 64.  Screening may begin sooner depending on your family history and other health conditions. Follow up colonoscopy as directed by your Gastroenterologist.  Screening for Prostate Cancer- Both [name] work (PSA) and a rectal exam help screen for Prostate Cancer.  Screening begins at age 77 with African-American men and at age 61 with Caucasian men.  Screening may begin  sooner depending on your family history.  Take these medicines  Aspirin- One aspirin daily can help prevent Heart disease and Stroke.  Flu shot- Every fall.  Tetanus- Every 10 years.  Zostavax- Once after the age of 14 to prevent Shingles.  Pneumonia shot- Once after the age of 22; if you are younger than 8, ask your healthcare provider if you need a Pneumonia shot.  Take these steps  Don't smoke- If you do smoke, talk to your doctor about quitting.  For tips on how to quit, go to www.smokefree.gov or call 1-800-QUIT-NOW.  Be physically active- Exercise 4-5 days a week for at least 30 minutes.  If you are not already physically active start slow and gradually work up to 30 minutes of moderate physical activity.  Examples of moderate activity include walking briskly, mowing the yard, dancing, swimming, bicycling, etc.  Eat a healthy diet- Eat a variety of healthy food such as fruits, vegetables, low fat milk, low fat cheese, yogurt, lean meant, poultry, fish, beans, tofu, etc. For more information go to www.thenutritionsource.org  Drink  alcohol in moderation- Limit alcohol intake to less than two drinks a day. Never drink and drive.  Dentist- Brush and floss twice daily; visit your dentist twice a year.  Depression- Your emotional health is as important as your physical health. If you're feeling down, or losing interest in things you would normally enjoy please talk to your healthcare provider.  Eye exam- Visit your eye doctor every year.  Safe sex- If you may be exposed to a sexually transmitted infection, use a condom.  Seat belts- Seat belts can save your life; always wear one.  Smoke/Carbon Monoxide detectors- These detectors need to be installed on the appropriate level of your home.  Replace batteries at least once a year.  Skin cancer- When out in the sun, cover up and use sunscreen 15 SPF or higher.  Violence- If anyone is threatening you, please tell your healthcare  provider.  Living Will/ Health care power of attorney- Speak with your healthcare provider and family.  IF you received an x-[name] today, you will receive an invoice from [facility]. Please contact [facility] at [phone] with questions or concerns regarding your invoice.   IF you received labwork today, you will receive an invoice from [place]. Please contact [facility] at [phone] with questions or concerns regarding your invoice.   Our billing staff will not be able to assist you with questions regarding bills from these companies.  You will be contacted with the lab results as soon as they are available. The fastest way to get your results is to activate your My Chart account. Instructions are located on the last page of this paperwork. If you have not heard from [place] regarding the results in 2 weeks, please contact this office.      I personally performed the services described in this documentation, which was scribed in my presence. The recorded information has been reviewed and considered for accuracy and completeness, addended by me as needed, and agree with information above.  Signed,   Patrick Ray, MD [facility].  05/15/17 11:02 PM

## 2017-05-15 ENCOUNTER — Encounter: Payer: Self-pay | Admitting: Family Medicine

## 2017-05-17 ENCOUNTER — Encounter: Payer: Self-pay | Admitting: Family Medicine

## 2017-07-02 ENCOUNTER — Other Ambulatory Visit: Payer: Self-pay

## 2017-07-02 ENCOUNTER — Encounter: Payer: Self-pay | Admitting: Family Medicine

## 2017-07-02 ENCOUNTER — Ambulatory Visit (AMBULATORY_SURGERY_CENTER): Payer: Self-pay

## 2017-07-02 VITALS — Ht 70.0 in | Wt 237.4 lb

## 2017-07-02 DIAGNOSIS — Z1211 Encounter for screening for malignant neoplasm of colon: Secondary | ICD-10-CM

## 2017-07-02 MED ORDER — NA SULFATE-K SULFATE-MG SULF 17.5-3.13-1.6 GM/177ML PO SOLN: 1.0000 | Freq: Once | ORAL | 0 refills | Status: AC

## 2017-07-02 NOTE — Progress Notes (Signed)
Denies allergies to eggs or soy products. Denies complication of anesthesia or sedation. Denies use of weight loss medication. Denies use of O2.   Emmi instructions declined.  

## 2017-07-15 ENCOUNTER — Encounter: Payer: Self-pay | Admitting: Gastroenterology

## 2017-07-16 ENCOUNTER — Other Ambulatory Visit: Payer: Self-pay

## 2017-07-16 ENCOUNTER — Encounter: Payer: Self-pay | Admitting: Gastroenterology

## 2017-07-22 ENCOUNTER — Ambulatory Visit (AMBULATORY_SURGERY_CENTER): Payer: BLUE CROSS/BLUE SHIELD | Admitting: Gastroenterology

## 2017-07-22 ENCOUNTER — Other Ambulatory Visit: Payer: Self-pay

## 2017-07-22 ENCOUNTER — Encounter: Payer: Self-pay | Admitting: Gastroenterology

## 2017-07-22 VITALS — BP 130/77 | HR 74 | Temp 98.7°F | Resp 15 | Ht 70.0 in | Wt 237.0 lb

## 2017-07-22 DIAGNOSIS — Z1211 Encounter for screening for malignant neoplasm of colon: Secondary | ICD-10-CM | POA: Diagnosis not present

## 2017-07-22 DIAGNOSIS — D122 Benign neoplasm of ascending colon: Secondary | ICD-10-CM | POA: Diagnosis not present

## 2017-07-22 MED ORDER — SODIUM CHLORIDE 0.9 % IV SOLN: 500.0000 mL | Freq: Once | INTRAVENOUS | Status: DC

## 2017-07-22 NOTE — Progress Notes (Signed)
Called to room to assist during endoscopic procedure.  Patient ID and intended procedure confirmed with present staff. Received instructions for my participation in the procedure from the performing physician.  

## 2017-07-22 NOTE — Op Note (Signed)
[facility] Patient Name: Patrick [name] Procedure Date: 07/22/2017 11:12 AM MRN: [id number] Endoscopist: Ladene Artist , MD Age: 57 Referring MD:  Date of Birth: [birth year] Gender: Male Account #: [id number] Procedure:                Colonoscopy Indications:              Screening for colorectal malignant neoplasm Medicines:                Monitored Anesthesia Care Procedure:                Pre-Anesthesia Assessment:                           - Prior to the procedure, a History and Physical                            was performed, and patient medications and                            allergies were reviewed. The patient's tolerance of                            previous anesthesia was also reviewed. The risks                            and benefits of the procedure and the sedation                            options and risks were discussed with the patient.                            All questions were answered, and informed consent                            was obtained. Prior Anticoagulants: The patient has                            taken no previous anticoagulant or antiplatelet                            agents. ASA Grade Assessment: II - A patient with                            mild systemic disease. After reviewing the risks                            and benefits, the patient was deemed in                            satisfactory condition to undergo the procedure.                           After obtaining informed consent, the colonoscope  was passed under direct vision. Throughout the                            procedure, the patient's blood pressure, pulse, and                            oxygen saturations were monitored continuously. The                            Colonoscope was introduced through the anus and                            advanced to the the cecum, identified by                            appendiceal orifice and  ileocecal valve. The                            ileocecal valve, appendiceal orifice, and rectum                            were photographed. The quality of the bowel                            preparation was good. The colonoscopy was performed                            without difficulty. The patient tolerated the                            procedure well. The quality of the bowel                            preparation was good. Scope In: 11:23:37 AM Scope Out: 11:34:11 AM Scope Withdrawal Time: 0 hours 8 minutes 55 seconds  Total Procedure Duration: 0 hours 10 minutes 34 seconds  Findings:                 The perianal and digital rectal examinations were                            normal.                           A 8 mm polyp was found in the ascending colon. The                            polyp was sessile. The polyp was removed with a                            cold snare. Resection and retrieval were complete.                           A few small-mouthed diverticula were found in the  left colon. There was no evidence of diverticular                            bleeding.                           Internal hemorrhoids were found during                            retroflexion. The hemorrhoids were small and Grade                            I (internal hemorrhoids that do not prolapse).                           The exam was otherwise without abnormality on                            direct and retroflexion views. Complications:            No immediate complications. Estimated blood loss:                            None. Estimated Blood Loss:     Estimated blood loss: none. Impression:               - One 8 mm polyp in the ascending colon, removed                            with a cold snare. Resected and retrieved.                           - Mild diverticulosis in the left colon. There was                            no evidence of diverticular  bleeding.                           - Internal hemorrhoids.                           - The examination was otherwise normal on direct                            and retroflexion views. Recommendation:           - Repeat colonoscopy in 5 years for surveillance if                            polyp is precancerous, otherwise 10 years.                           - Patient has a contact number available for                            emergencies. The signs and symptoms of potential  delayed complications were discussed with the                            patient. Return to normal activities tomorrow.                            Written discharge instructions were provided to the                            patient.                           - High fiber diet.                           - Continue present medications.                           - Await pathology results. Ladene Artist, MD 07/22/2017 11:37:38 AM This report has been signed electronically.

## 2017-07-22 NOTE — Progress Notes (Signed)
No changes in medical or surgical hx since PV per pt 

## 2017-07-22 NOTE — Patient Instructions (Signed)
Impression/Recommendations:  Polyp handout given to patient. Diverticulosis handout given to patient. Hemorrhoid handout given to patient. High fiber diet handout given to patient.  Repeat colonoscopy for surveillance.  Date to be determined after pathology results reveiwed.  Continue present medications.  YOU HAD AN ENDOSCOPIC PROCEDURE TODAY AT [facility]:   Refer to the procedure report that was given to you for any specific questions about what was found during the examination.  If the procedure report does not answer your questions, please call your gastroenterologist to clarify.  If you requested that your care partner not be given the details of your procedure findings, then the procedure report has been included in a sealed envelope for you to review at your convenience later.  YOU SHOULD EXPECT: Some feelings of bloating in the abdomen. Passage of more gas than usual.  Walking can help get rid of the air that was put into your GI tract during the procedure and reduce the bloating. If you had a lower endoscopy (such as a colonoscopy or flexible sigmoidoscopy) you may notice spotting of blood in your stool or on the toilet paper. If you underwent a bowel prep for your procedure, you may not have a normal bowel movement for a few days.  Please Note:  You might notice some irritation and congestion in your nose or some drainage.  This is from the oxygen used during your procedure.  There is no need for concern and it should clear up in a day or so.  SYMPTOMS TO REPORT IMMEDIATELY:   Following lower endoscopy (colonoscopy or flexible sigmoidoscopy):  Excessive amounts of blood in the stool  Significant tenderness or worsening of abdominal pains  Swelling of the abdomen that is new, acute  Fever of 100F or higher For urgent or emergent issues, a gastroenterologist can be reached at any hour by calling [phone].   DIET:  We do recommend a small meal at first,  but then you may proceed to your regular diet.  Drink plenty of fluids but you should avoid alcoholic beverages for 24 hours.  ACTIVITY:  You should plan to take it easy for the rest of today and you should NOT DRIVE or use heavy machinery until tomorrow (because of the sedation medicines used during the test).    FOLLOW UP: Our staff will call the number listed on your records the next business day following your procedure to check on you and address any questions or concerns that you may have regarding the information given to you following your procedure. If we do not reach you, we will leave a message.  However, if you are feeling well and you are not experiencing any problems, there is no need to return our call.  We will assume that you have returned to your regular daily activities without incident.  If any biopsies were taken you will be contacted by phone or by letter within the next 1-3 weeks.  Please call us at [phone] if you have not heard about the biopsies in 3 weeks.    SIGNATURES/CONFIDENTIALITY: You and/or your care partner have signed paperwork which will be entered into your electronic medical record.  These signatures attest to the fact that that the information above on your After Visit Summary has been reviewed and is understood.  Full responsibility of the confidentiality of this discharge information lies with you and/or your care-partner.

## 2017-07-22 NOTE — Progress Notes (Signed)
Report to PACU, RN, vss, BBS= Clear.  

## 2017-07-23 ENCOUNTER — Telehealth: Payer: Self-pay | Admitting: *Deleted

## 2017-07-23 ENCOUNTER — Telehealth: Payer: Self-pay

## 2017-07-23 NOTE — Telephone Encounter (Signed)
  Follow up Call-  Call back number 07/22/2017  Post procedure Call Back phone  # [phone]  Permission to leave phone message Yes     Patient questions:  Do you have a fever, pain , or abdominal swelling? No. Pain Score  0 *  Have you tolerated food without any problems? Yes.    Have you been able to return to your normal activities? Yes.    Do you have any questions about your discharge instructions: Diet   No. Medications  No. Follow up visit  No.  Do you have questions or concerns about your Care? No.  Actions: * If pain score is 4 or above: No action needed, pain <4.

## 2017-07-23 NOTE — Telephone Encounter (Signed)
Called #[phone] and left a messaged we tried to reach pt for a follow up call. maw

## 2017-07-26 ENCOUNTER — Encounter: Payer: Self-pay | Admitting: Gastroenterology

## 2017-07-28 ENCOUNTER — Encounter: Payer: Self-pay | Admitting: Gastroenterology

## 2017-10-24 DIAGNOSIS — D1801 Hemangioma of skin and subcutaneous tissue: Secondary | ICD-10-CM | POA: Diagnosis not present

## 2017-10-24 DIAGNOSIS — L821 Other seborrheic keratosis: Secondary | ICD-10-CM | POA: Diagnosis not present

## 2017-10-24 DIAGNOSIS — D225 Melanocytic nevi of trunk: Secondary | ICD-10-CM | POA: Diagnosis not present

## 2017-10-24 DIAGNOSIS — R208 Other disturbances of skin sensation: Secondary | ICD-10-CM | POA: Diagnosis not present

## 2017-10-24 DIAGNOSIS — L918 Other hypertrophic disorders of the skin: Secondary | ICD-10-CM | POA: Diagnosis not present

## 2017-11-08 DIAGNOSIS — M79641 Pain in right hand: Secondary | ICD-10-CM | POA: Diagnosis not present

## 2017-11-08 DIAGNOSIS — M79642 Pain in left hand: Secondary | ICD-10-CM | POA: Diagnosis not present

## 2017-12-16 DIAGNOSIS — R200 Anesthesia of skin: Secondary | ICD-10-CM | POA: Diagnosis not present

## 2017-12-16 DIAGNOSIS — G5601 Carpal tunnel syndrome, right upper limb: Secondary | ICD-10-CM | POA: Diagnosis not present

## 2018-01-24 DIAGNOSIS — M79642 Pain in left hand: Secondary | ICD-10-CM | POA: Diagnosis not present

## 2018-01-24 DIAGNOSIS — M79641 Pain in right hand: Secondary | ICD-10-CM | POA: Diagnosis not present

## 2018-06-16 ENCOUNTER — Telehealth: Payer: Self-pay | Admitting: Internal Medicine

## 2018-06-16 NOTE — Telephone Encounter (Signed)
Copied from [facility] [phone]. Topic: Appointment Scheduling - New Patient >> Jun 13, 2018 11:41 AM Oneta Rack wrote: Relation to pt: self  Call back [phone]   Reason for call:  Patient would like to establish care with Dr. Jenny Reichmann stating he was referred by his wife who is now a patient at the practice. Patient states he is not having any major issues but would like to discuss his BP and would like a physical, please advise

## 2018-06-16 NOTE — Telephone Encounter (Signed)
Ok with me, but I think should try be scheduled for May or after to establish in person if this is possible

## 2018-06-16 NOTE — Telephone Encounter (Signed)
Spoke with patient and appointment has been made for 07/30/18. New patient forms have been mailed to patient.

## 2018-07-30 ENCOUNTER — Encounter: Payer: Self-pay | Admitting: Internal Medicine

## 2018-07-30 ENCOUNTER — Ambulatory Visit (INDEPENDENT_AMBULATORY_CARE_PROVIDER_SITE_OTHER): Payer: BLUE CROSS/BLUE SHIELD | Admitting: Internal Medicine

## 2018-07-30 VITALS — Ht 70.0 in

## 2018-07-30 DIAGNOSIS — Z0001 Encounter for general adult medical examination with abnormal findings: Secondary | ICD-10-CM | POA: Insufficient documentation

## 2018-07-30 DIAGNOSIS — Z0000 Encounter for general adult medical examination without abnormal findings: Secondary | ICD-10-CM | POA: Diagnosis not present

## 2018-07-30 DIAGNOSIS — E785 Hyperlipidemia, unspecified: Secondary | ICD-10-CM

## 2018-07-30 DIAGNOSIS — Z114 Encounter for screening for human immunodeficiency virus [HIV]: Secondary | ICD-10-CM

## 2018-07-30 DIAGNOSIS — R030 Elevated blood-pressure reading, without diagnosis of hypertension: Secondary | ICD-10-CM

## 2018-07-30 DIAGNOSIS — I10 Essential (primary) hypertension: Secondary | ICD-10-CM | POA: Insufficient documentation

## 2018-07-30 DIAGNOSIS — E538 Deficiency of other specified B group vitamins: Secondary | ICD-10-CM

## 2018-07-30 DIAGNOSIS — E559 Vitamin D deficiency, unspecified: Secondary | ICD-10-CM

## 2018-07-30 DIAGNOSIS — R739 Hyperglycemia, unspecified: Secondary | ICD-10-CM | POA: Diagnosis not present

## 2018-07-30 DIAGNOSIS — E611 Iron deficiency: Secondary | ICD-10-CM

## 2018-07-30 NOTE — Progress Notes (Signed)
Patient ID: Patrick [name], male   DOB: 02-14-1961, 58 y.o.   MRN: [id number]  Virtual Visit via Video Note  I connected with [name] on 07/30/18 at 10:40 AM EDT by a video enabled telemedicine application and verified that I am speaking with the correct person using two identifiers.  Location: Patient: at home Provider: at office   I discussed the limitations of evaluation and management by telemedicine and the availability of in person appointments. The patient expressed understanding and agreed to proceed.  History of Present Illness: Here for wellness and establish as new pt;  Overall doing ok;  Pt denies Chest pain, worsening SOB, DOE, wheezing, orthopnea, PND, worsening LE edema, palpitations, dizziness or syncope.  Pt denies neurological change such as new headache, facial or extremity weakness.  Pt denies polydipsia, polyuria, or low sugar symptoms. Pt states overall good compliance with treatment and medications, good tolerability, and has been trying to follow appropriate diet.  Pt denies worsening depressive symptoms, suicidal ideation or panic. No fever, night sweats, wt loss, loss of appetite, or other constitutional symptoms.  Pt states good ability with ADL's, has low fall risk, home safety reviewed and adequate, no other significant changes in hearing or vision, and only occasionally active with exercise.  Has not been checking BP but willing to start. 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   Has donated blood for years, BP there has been 120's/80s.  The last 3 times however - bp 160/84, 160/100, 160/104.  Taking it at home after that with accurate machine has been 130/80.  Wt overall stable around 232, usual for him.  Has some snoring but no stopping breathing, has known deviated nasal septum and allergies.  Has seen a cardiology in 2014 in [place] and was felt to not need f/u.  Seen for fluttering in chest with a cough,  but stress test, ekg, holter monitor, echo - all negative/normal per pt.  Also has bilat thumb DJD, thinks he will eventually need knee and hip treatment for recurring pain and known left ACL tear from 25 yrs ago   Past Medical History:  Diagnosis Date  . Allergy   . Anemia   . Chronic kidney disease    Kidney Stone  . GERD (gastroesophageal reflux disease)   . Heart murmur    Past Surgical History:  Procedure Laterality Date  . APPENDECTOMY    . Arthroscopic knee surgeries on both knees    . CHOLECYSTECTOMY    . VASECTOMY      reports that he has been smoking cigars. He has never used smokeless tobacco. He reports current alcohol use of about 5.0 standard drinks of alcohol per week. He reports that he does not use drugs. family history is not on file. He was adopted. Allergies  Allergen Reactions  . Penicillins Hives    Rash around neck  Has patient had a PCN reaction causing immediate rash, facial/tongue/throat swelling, SOB or lightheadedness with hypotension: Yes Has patient had a PCN reaction causing severe rash involving mucus membranes or skin necrosis: No Has patient had a PCN reaction that required hospitalization: No Has patient had a PCN reaction occurring within the last 10 years: No If all of the above answers are "NO", then may proceed with Cephalosporin use.   Current Outpatient Medications on File Prior to Visit  Medication Sig Dispense Refill  . aspirin EC 81 MG tablet Take 81 mg by mouth at bedtime.    Marland Kitchen  cetirizine (ZYRTEC) 10 MG tablet Take 10 mg by mouth at bedtime.     . diphenhydrAMINE (BENADRYL) 25 MG tablet Take 25 mg by mouth 2 (two) times daily.     . Inositol Niacinate (NIACIN FLUSH FREE) 500 MG CAPS Take 500 mg by mouth 2 (two) times daily.    Javier Docker Oil 300 MG CAPS Take 300 mg by mouth at bedtime.     . Multiple Vitamin (MULTIVITAMIN WITH MINERALS) TABS tablet Take 1 tablet by mouth daily. Men's Over 73    . Turmeric Curcumin 500 MG CAPS Take 500 mg  by mouth daily.     No current facility-administered medications on file prior to visit.    Observations/Objective: Alert, NAD, appropriate mood and affect, resps normal, cn 2-12 intact, moves all 4s, no visible rash or swelling Lab Results  Component Value Date   WBC 5.9 08/01/2018   HGB 16.2 08/01/2018   HCT 46.4 08/01/2018   PLT 223.0 08/01/2018   GLUCOSE 87 08/01/2018   CHOL 200 08/01/2018   TRIG 198.0 (H) 08/01/2018   HDL 47.00 08/01/2018   LDLCALC 113 (H) 08/01/2018   ALT 33 08/01/2018   AST 23 08/01/2018   NA 138 08/01/2018   K 4.1 08/01/2018   CL 102 08/01/2018   CREATININE 0.81 08/01/2018   BUN 15 08/01/2018   CO2 26 08/01/2018   TSH 2.12 08/01/2018   PSA 0.43 08/01/2018   HGBA1C 5.6 08/01/2018   Assessment and Plan: See notes  Follow Up Instructions: See notes   I discussed the assessment and treatment plan with the patient. The patient was provided an opportunity to ask questions and all were answered. The patient agreed with the plan and demonstrated an understanding of the instructions.   The patient was advised to call back or seek an in-person evaluation if the symptoms worsen or if the condition fails to improve as anticipated.  Cathlean Cower, MD

## 2018-07-30 NOTE — Patient Instructions (Signed)
.  Please continue all other medications as before, and refills have been done if requested.  Please have the pharmacy call with any other refills you may need.  Please continue your efforts at being more active, low cholesterol diet, and weight control.  You are otherwise up to date with prevention measures today.  Please keep your appointments with your specialists as you may have planned  The office will call to have you be seen by the nurse for a  Blood pressure check  Please go to the LAB in the Basement (turn left off the elevator) for the tests to be done at your convenience  You will be contacted by phone if any changes need to be made immediately.  Otherwise, you will receive a letter about your results with an explanation, but please check with MyChart first.  Please remember to sign up for MyChart if you have not done so, as this will be important to you in the future with finding out test results, communicating by private email, and scheduling acute appointments online when needed.  Please return in 1 year for your yearly visit, or sooner if needed, with Lab testing done 3-5 days before

## 2018-08-01 ENCOUNTER — Ambulatory Visit: Payer: BLUE CROSS/BLUE SHIELD

## 2018-08-01 ENCOUNTER — Other Ambulatory Visit: Payer: Self-pay | Admitting: Internal Medicine

## 2018-08-01 ENCOUNTER — Ambulatory Visit: Payer: BLUE CROSS/BLUE SHIELD | Admitting: Internal Medicine

## 2018-08-01 ENCOUNTER — Other Ambulatory Visit: Payer: Self-pay

## 2018-08-01 ENCOUNTER — Telehealth: Payer: Self-pay

## 2018-08-01 ENCOUNTER — Other Ambulatory Visit (INDEPENDENT_AMBULATORY_CARE_PROVIDER_SITE_OTHER): Payer: BLUE CROSS/BLUE SHIELD

## 2018-08-01 VITALS — BP 168/90

## 2018-08-01 DIAGNOSIS — E538 Deficiency of other specified B group vitamins: Secondary | ICD-10-CM

## 2018-08-01 DIAGNOSIS — Z0000 Encounter for general adult medical examination without abnormal findings: Secondary | ICD-10-CM | POA: Diagnosis not present

## 2018-08-01 DIAGNOSIS — E559 Vitamin D deficiency, unspecified: Secondary | ICD-10-CM | POA: Diagnosis not present

## 2018-08-01 DIAGNOSIS — Z125 Encounter for screening for malignant neoplasm of prostate: Secondary | ICD-10-CM | POA: Diagnosis not present

## 2018-08-01 DIAGNOSIS — R739 Hyperglycemia, unspecified: Secondary | ICD-10-CM | POA: Diagnosis not present

## 2018-08-01 DIAGNOSIS — Z114 Encounter for screening for human immunodeficiency virus [HIV]: Secondary | ICD-10-CM

## 2018-08-01 DIAGNOSIS — I10 Essential (primary) hypertension: Secondary | ICD-10-CM

## 2018-08-01 DIAGNOSIS — E611 Iron deficiency: Secondary | ICD-10-CM

## 2018-08-01 LAB — BASIC METABOLIC PANEL
BUN: 15 mg/dL (ref 6–23)
CO2: 26 mEq/L (ref 19–32)
Calcium: 9.3 mg/dL (ref 8.4–10.5)
Chloride: 102 mEq/L (ref 96–112)
Creatinine, Ser: 0.81 mg/dL (ref 0.40–1.50)
GFR: 98.09 mL/min (ref >60.00)
Glucose, Bld: 87 mg/dL (ref 70–99)
Potassium: 4.1 mEq/L (ref 3.5–5.1)
Sodium: 138 mEq/L (ref 135–145)

## 2018-08-01 LAB — LIPID PANEL
Cholesterol: 200 mg/dL (ref 0–200)
HDL: 47 mg/dL (ref >39.00)
LDL Cholesterol: 113 mg/dL — ABNORMAL HIGH (ref 0–99)
NonHDL: 153.04
Total CHOL/HDL Ratio: 4
Triglycerides: 198 mg/dL — ABNORMAL HIGH (ref 0.0–149.0)
VLDL: 39.6 mg/dL (ref 0.0–40.0)

## 2018-08-01 LAB — VITAMIN D 25 HYDROXY (VIT D DEFICIENCY, FRACTURES): VITD: 25.94 ng/mL — ABNORMAL LOW (ref 30.00–100.00)

## 2018-08-01 LAB — CBC WITH DIFFERENTIAL/PLATELET
Basophils Absolute: 0 10*3/uL (ref 0.0–0.1)
Basophils Relative: 0.6 % (ref 0.0–3.0)
Eosinophils Absolute: 0.1 10*3/uL (ref 0.0–0.7)
Eosinophils Relative: 2.4 % (ref 0.0–5.0)
HCT: 46.4 % (ref 39.0–52.0)
Hemoglobin: 16.2 g/dL (ref 13.0–17.0)
Lymphocytes Relative: 30.4 % (ref 12.0–46.0)
Lymphs Abs: 1.8 10*3/uL (ref 0.7–4.0)
MCHC: 34.8 g/dL (ref 30.0–36.0)
MCV: 87 fl (ref 78.0–100.0)
Monocytes Absolute: 0.5 10*3/uL (ref 0.1–1.0)
Monocytes Relative: 8.7 % (ref 3.0–12.0)
Neutro Abs: 3.4 10*3/uL (ref 1.4–7.7)
Neutrophils Relative %: 57.9 % (ref 43.0–77.0)
Platelets: 223 10*3/uL (ref 150.0–400.0)
RBC: 5.33 Mil/uL (ref 4.22–5.81)
RDW: 12.9 % (ref 11.5–15.5)
WBC: 5.9 10*3/uL (ref 4.0–10.5)

## 2018-08-01 LAB — PSA: PSA: 0.43 ng/mL (ref 0.10–4.00)

## 2018-08-01 LAB — URINALYSIS, ROUTINE W REFLEX MICROSCOPIC
Bilirubin Urine: NEGATIVE
Hgb urine dipstick: NEGATIVE
Ketones, ur: NEGATIVE
Leukocytes,Ua: NEGATIVE
Nitrite: NEGATIVE
Specific Gravity, Urine: 1.025 (ref 1.000–1.030)
Total Protein, Urine: NEGATIVE
Urine Glucose: NEGATIVE
Urobilinogen, UA: 0.2 (ref 0.0–1.0)
pH: 6 (ref 5.0–8.0)

## 2018-08-01 LAB — HEPATIC FUNCTION PANEL
ALT: 33 U/L (ref 0–53)
AST: 23 U/L (ref 0–37)
Albumin: 4.3 g/dL (ref 3.5–5.2)
Alkaline Phosphatase: 65 U/L (ref 39–117)
Bilirubin, Direct: 0.1 mg/dL (ref 0.0–0.3)
Total Bilirubin: 0.9 mg/dL (ref 0.2–1.2)
Total Protein: 7 g/dL (ref 6.0–8.3)

## 2018-08-01 LAB — TSH: TSH: 2.12 u[IU]/mL (ref 0.35–4.50)

## 2018-08-01 LAB — IBC PANEL
Iron: 124 ug/dL (ref 42–165)
Saturation Ratios: 31.6 % (ref 20.0–50.0)
Transferrin: 280 mg/dL (ref 212.0–360.0)

## 2018-08-01 LAB — HEMOGLOBIN A1C: Hgb A1c MFr Bld: 5.6 % (ref 4.6–6.5)

## 2018-08-01 LAB — VITAMIN B12: Vitamin B-12: 349 pg/mL (ref 211–911)

## 2018-08-01 MED ORDER — VITAMIN D (ERGOCALCIFEROL) 1.25 MG (50000 UNIT) PO CAPS: 50000.0000 [IU] | ORAL_CAPSULE | ORAL | 0 refills | Status: DC

## 2018-08-01 MED ORDER — TELMISARTAN 80 MG PO TABS: 80.0000 mg | ORAL_TABLET | Freq: Every day | ORAL | 3 refills | Status: DC

## 2018-08-01 MED ORDER — ROSUVASTATIN CALCIUM 10 MG PO TABS: 10.0000 mg | ORAL_TABLET | Freq: Every day | ORAL | 3 refills | Status: DC

## 2018-08-01 NOTE — Telephone Encounter (Signed)
Patient advised bp med has been sent to pharm along with dr Judi Cong note/instructions---if med does not control bp number and/or patient becomes symptomatic with chest pain, sob, n/v/d, go to ED over weekend

## 2018-08-01 NOTE — Telephone Encounter (Signed)
Patient came in today (nurse visit) to have bp rechecked per dr Gwynn Burly instructions,he has already had video visit with dr Jenny Reichmann  he has also gone down for lab work as requested by dr john---today's reading was 168/90---patient is asymptomatic for headache,blurred vision, dizziness, n/v/d, chest pain, shortness of breath and states he has not been under any kind of stress he could relate these issues to---he had gall bladder removed about 8-10 years ago, he has seen cardiology in past for flutters and had ekg with dr Jenny Reichmann last year ---please advise if you need patient to come in for further evaluation or what your recommendations would be---routing to dr Jenny Reichmann, please advise, I will call patient back, thanks

## 2018-08-01 NOTE — Telephone Encounter (Signed)
Ok to contact pt  BP is high c/w the numbers he was getting at home  OK to start new med for new HTN called micardis 80 mg per day - this is generic, and VERY easy to take, only once per day in the AM  Please check BP at home starting after the first 3-4 days, and let us know daily BP (different times of the day) in 1 wk

## 2018-08-02 ENCOUNTER — Encounter: Payer: Self-pay | Admitting: Internal Medicine

## 2018-08-02 DIAGNOSIS — E559 Vitamin D deficiency, unspecified: Secondary | ICD-10-CM

## 2018-08-02 DIAGNOSIS — E785 Hyperlipidemia, unspecified: Secondary | ICD-10-CM

## 2018-08-02 DIAGNOSIS — R739 Hyperglycemia, unspecified: Secondary | ICD-10-CM | POA: Insufficient documentation

## 2018-08-02 LAB — HIV ANTIBODY (ROUTINE TESTING W REFLEX): HIV 1&2 Ab, 4th Generation: NONREACTIVE

## 2018-08-02 NOTE — Assessment & Plan Note (Signed)

## 2018-08-02 NOTE — Assessment & Plan Note (Signed)
Pt to check BP twice daily for one wk and let us know results by [employer]

## 2018-08-02 NOTE — Assessment & Plan Note (Signed)
Also for a1c 

## 2018-08-02 NOTE — Assessment & Plan Note (Signed)
For vit d level with labs

## 2018-08-04 NOTE — Telephone Encounter (Signed)
Jonelle Sidle to see pt concern about the micardis, thanks

## 2018-08-05 ENCOUNTER — Telehealth: Payer: Self-pay

## 2018-08-05 NOTE — Telephone Encounter (Signed)
-----   Message from Biagio Borg, MD sent at 08/01/2018  5:09 PM EDT ----- Left message on MyChart, pt to cont same tx except  The test results show that your current treatment is OK, as the tests are stable except the Vitamin D level is low, and the LDL cholesterol is mildly too high.    We should:   1)  Take Vitamin D 50000 units weekly for 12 weeks, then after that take OTC Vitamin D3 at 2000 units per day to keep the level up 2)  Please start Crestor 10 mg per day to lower the cholesterol and the risk of heart disease and stroke  Idolina Mantell to please inform pt, I will do rx x 2

## 2018-08-05 NOTE — Telephone Encounter (Signed)
Pt has viewed results via MyChart  

## 2018-09-29 ENCOUNTER — Ambulatory Visit: Payer: BC Managed Care – PPO | Admitting: Family Medicine

## 2018-09-29 ENCOUNTER — Other Ambulatory Visit: Payer: Self-pay

## 2018-09-29 ENCOUNTER — Encounter: Payer: Self-pay | Admitting: Family Medicine

## 2018-09-29 VITALS — BP 134/80 | HR 96 | Resp 16 | Ht 70.0 in | Wt 239.0 lb

## 2018-09-29 DIAGNOSIS — E559 Vitamin D deficiency, unspecified: Secondary | ICD-10-CM

## 2018-09-29 DIAGNOSIS — M2241 Chondromalacia patellae, right knee: Secondary | ICD-10-CM | POA: Diagnosis not present

## 2018-09-29 DIAGNOSIS — M25562 Pain in left knee: Secondary | ICD-10-CM | POA: Diagnosis not present

## 2018-09-29 DIAGNOSIS — M24559 Contracture, unspecified hip: Secondary | ICD-10-CM

## 2018-09-29 DIAGNOSIS — M2242 Chondromalacia patellae, left knee: Secondary | ICD-10-CM

## 2018-09-29 NOTE — Progress Notes (Signed)
[name] [address] [place], [place] [address] Phone: [phone] Subjective:    I'm seeing this patient by the request  of:  Biagio Borg, MD   CC: Bilateral knee and bilateral hip pain  [id number]  Patrick [name] is a 58 y.o. male coming in with complaint of L knee and bilateral hip pain.  Has had bilateral knee arthroscopic exams patient states.  Patient states that he has not does not have an ACL on the left side.  Patient though states that he does not have any significant instability but can have pain with going up or down hills.  Whenever he tries to increase activity can have more difficulty.  States that he does feel like he has significant tightness of the calfs bilaterally a lot of the time.  Patient has not noticed any swelling.  Does seem to respond to anti-inflammatories.  Patient also has had some difficulty with his hips.  Seems to be worse after sitting a long amount of time.  Patient states that it seems to be on the anterior aspect.  When he gets moving it seems to get better but can give him spasms.  Worse after repetitive motions or when he does yard work.     Past Medical History:  Diagnosis Date  . Allergy   . Anemia   . Chronic kidney disease    Kidney Stone  . GERD (gastroesophageal reflux disease)   . Heart murmur    Past Surgical History:  Procedure Laterality Date  . APPENDECTOMY    . Arthroscopic knee surgeries on both knees    . CHOLECYSTECTOMY    . VASECTOMY     Social History   Socioeconomic History  . Marital status: Married    Spouse name: Not on file  . Number of children: Not on file  . Years of education: Not on file  . Highest education level: Not on file  Occupational History  . Occupation: Print production planner, Financial controller, Semi-retired  Social Needs  . Financial resource strain: Not on file  . Food insecurity    Worry: Not on file    Inability: Not on file  . Transportation needs    Medical: Not on file     Non-medical: Not on file  Tobacco Use  . Smoking status: Light Tobacco Smoker    Types: Cigars  . Smokeless tobacco: Never Used  Substance and Sexual Activity  . Alcohol use: Yes    Alcohol/week: 5.0 standard drinks    Types: 5 Standard drinks or equivalent per week  . Drug use: No  . Sexual activity: Yes  Lifestyle  . Physical activity    Days per week: Not on file    Minutes per session: Not on file  . Stress: Not on file  Relationships  . Social Herbalist on phone: Not on file    Gets together: Not on file    Attends religious service: Not on file    Active member of club or organization: Not on file    Attends meetings of clubs or organizations: Not on file    Relationship status: Not on file  Other Topics Concern  . Not on file  Social History Narrative   Married.   Education: College   Exercise: Yes   Allergies  Allergen Reactions  . Penicillins Hives    Rash around neck  Has patient had a PCN reaction causing immediate rash, facial/tongue/throat swelling, SOB or  lightheadedness with hypotension: Yes Has patient had a PCN reaction causing severe rash involving mucus membranes or skin necrosis: No Has patient had a PCN reaction that required hospitalization: No Has patient had a PCN reaction occurring within the last 10 years: No If all of the above answers are "NO", then may proceed with Cephalosporin use.   Family History  Adopted: Yes     Current Outpatient Medications (Cardiovascular):  Marland Kitchen  Inositol Niacinate (NIACIN FLUSH FREE) 500 MG CAPS, Take 500 mg by mouth 2 (two) times daily. .  rosuvastatin (CRESTOR) 10 MG tablet, Take 1 tablet (10 mg total) by mouth daily. Marland Kitchen  telmisartan (MICARDIS) 80 MG tablet, Take 1 tablet (80 mg total) by mouth daily.  Current Outpatient Medications (Respiratory):  .  cetirizine (ZYRTEC) 10 MG tablet, Take 10 mg by mouth at bedtime.  .  diphenhydrAMINE (BENADRYL) 25 MG tablet, Take 25 mg by mouth 2 (two) times  daily.   Current Outpatient Medications (Analgesics):  .  aspirin EC 81 MG tablet, Take 81 mg by mouth at bedtime.   Current Outpatient Medications (Other):  Javier Docker Oil 300 MG CAPS, Take 300 mg by mouth at bedtime.  .  Multiple Vitamin (MULTIVITAMIN WITH MINERALS) TABS tablet, Take 1 tablet by mouth daily. Men's Over 15 .  Turmeric Curcumin 500 MG CAPS, Take 500 mg by mouth daily. .  Vitamin D, Ergocalciferol, (DRISDOL) 1.25 MG (50000 UT) CAPS capsule, Take 1 capsule (50,000 Units total) by mouth every 7 (seven) days.    Past medical history, social, surgical and family history all reviewed in electronic medical record.  No pertanent information unless stated regarding to the chief complaint.   Review of Systems:  No headache, visual changes, nausea, vomiting, diarrhea, constipation, dizziness, abdominal pain, skin rash, fevers, chills, night sweats, weight loss, swollen lymph nodes, body aches, joint swelling, , chest pain, shortness of breath, mood changes.  Positive muscle aches  Objective  Blood pressure 134/80, pulse 96, resp. rate 16, height 5\' 10"  (1.778 m), weight 239 lb (108.4 kg), SpO2 96 %.    General: No apparent distress alert and oriented x3 mood and affect normal, dressed appropriately.  Overweight HEENT: Pupils equal, extraocular movements intact  Respiratory: Patient's speak in full sentences and does not appear short of breath  Cardiovascular: No lower extremity edema, non tender, no erythema  Skin: Warm dry intact with no signs of infection or rash on extremities or on axial skeleton.  Abdomen: Soft nontender  Neuro: Cranial nerves II through XII are intact, neurovascularly intact in all extremities with 2+ DTRs and 2+ pulses.  Lymph: No lymphadenopathy of posterior or anterior cervical chain or axillae bilaterally.  Gait normal with good balance and coordination.  MSK:  tender with full range of motion and good stability and symmetric strength and tone of  shoulders, elbows, wrist, , and ankles bilaterally.  Patient's knee exam does have some crepitus bilaterally.  Mild positive patellar grind test.  Patient does have laxity of the ACL but right greater than the left.  No instability though with valgus or varus force.  Positive dial test on the right. Bilateral hip exams does have some tightness but near full range of motion.  Significant tightness of the hip flexors patient does have some loss of lordosis of the lumbar spine.  Nontender over the greater trochanteric area.  Mild tightness still with Corky Sox bilaterally  Limited musculoskeletal ultrasound was performed and interpreted by Lyndal Pulley  Limited ultrasound patient's  knees bilaterally show some significant chondromalacia.   Impression and Recommendations:     This case required medical decision making of moderate complexity. The above documentation has been reviewed and is accurate and complete Lyndal Pulley, DO       Note: This dictation was prepared with Dragon dictation along with smaller phrase technology. Any transcriptional errors that result from this process are unintentional.

## 2018-09-29 NOTE — Assessment & Plan Note (Signed)
Bilateral hip flexor tightness.  Discussed with patient about posture and ergonomics.  We discussed with patient about weight loss.  Work with Product/process development scientist to learn home exercises in greater detail.  Once weekly vitamin D for strength and endurance with history of vitamin D deficiency.  Follow-up again in 4 to 8 weeks

## 2018-09-29 NOTE — Assessment & Plan Note (Signed)
Chondromalacia bilaterally.  Discussed over-the-counter medications, we discussed topical anti-inflammatories and icing regimen, discussed continuing the over-the-counter natural supplementations.  Discussed the importance of weight loss and other exercises.  Follow-up again in 4 to 8 weeks

## 2018-09-29 NOTE — Assessment & Plan Note (Signed)
Once weekly vitamin D

## 2018-09-29 NOTE — Patient Instructions (Signed)
Good to see you.  Ice 20 minutes 2 times daily. Usually after activity and before bed. Exercises 3 times a week. Alternate the knee and the hip  pennsaid pinkie amount topically 2 times daily as needed. .  Turmeric 500mg  daily  Tart cherry extract 1200mg  at night Vitamin D 2000 IU daily  DHEA 50 mg daily for 4 weeks See me again in 5-6 weeks

## 2018-10-14 ENCOUNTER — Telehealth: Payer: Self-pay

## 2018-10-14 NOTE — Telephone Encounter (Signed)
Called patient to reschedule 9/14 appointment. States that he will call back to reschedule because he was driving

## 2018-10-16 ENCOUNTER — Telehealth: Payer: Self-pay

## 2018-10-16 NOTE — Telephone Encounter (Signed)
Called and rescheduled patient appointment on 9/14

## 2018-10-23 ENCOUNTER — Encounter: Payer: Self-pay | Admitting: Internal Medicine

## 2018-10-23 DIAGNOSIS — J309 Allergic rhinitis, unspecified: Secondary | ICD-10-CM

## 2018-11-05 NOTE — Progress Notes (Signed)
New Patient Note  RE: Patrick [name] MRN: [id number] DOB: 1960-11-06 Date of Office Visit: 11/06/2018  Referring provider: Biagio Borg, MD Primary care provider: Biagio Borg, MD  Chief Complaint: Allergic Rhinitis  and Snoring  History of Present Illness: I had the pleasure of seeing Patrick [name] for initial evaluation at the [facility] on 11/06/2018. He is a 58 y.o. male, who is referred here by Biagio Borg, MD for the evaluation of allergic rhinitis.   Rhinitis: He reports symptoms of congestion, PND, snoring, coughing, sneezing usually at night. Symptoms have been going on for 40+ years. The symptoms are present all year around. Other triggers include exposure to unknown. Anosmia: diminished. Headache: no. He has used nettipot, benadryl, zyrtec, fair improvement in symptoms. Sinus infections: 2 per year usually until started using nettipot. Previous work up includes: none. Previous ENT evaluation: no. Previous sinus imaging: no. History of nasal polyps: no. No history of OSA.  Assessment and Plan: Patrick [name] is a 58 y.o. male with: Other allergic rhinitis Perennial rhinitis symptoms for the past 40+ years with worsening mainly at night.  Used to have 2 sinus infections per year but now using Nettie pot with good benefit.  Takes Zyrtec and Benadryl as needed.  No previous ENT evaluation.  Denies history of reflux or obstructive sleep apnea.  Today's skin testing was positive test to: intradermal testing slightly positive to grass, weed and borderline positive to molds.   Start environmental control measures.  Discussed with patient that given his symptomology and allergy testing results these may not be the main cause of his rhinitis symptoms  Nasal saline spray (i.e., Simply Saline) or nasal saline lavage (i.e., NeilMed) is recommended as needed and prior to medicated nasal sprays.  Start dymista 1 spray twice a day.  Demonstrated proper use  If it's not  covered let us know.   May use over the counter antihistamines such as Zyrtec (cetirizine), Claritin (loratadine), Allegra (fexofenadine), or Xyzal (levocetirizine) daily as needed.  If not improved then will refer to ENT for further evaluation.  Coughing Coughing is most likely from his postnasal drip however there was a history of nebulizer use during upper respiratory infection.  Otherwise denies any respiratory symptoms.  Today's spirometry showed mild restriction most likely due to body habitus.  Monitor symptoms.   Penicillin allergy Hives with penicillin exposure in the past.  Continue avoidance and consider penicillin testing in future.  Return in about 2 months (around 01/06/2019).  Meds ordered this encounter  Medications  . Azelastine-Fluticasone 137-50 MCG/ACT SUSP    Sig: Place 1 spray into the nose 2 (two) times daily.    Dispense:  23 g    Refill:  5    Use this coupon code: [id number]; [id number];  RxGRP:[id number]; Issuer:[id number]; BY:[id number]   Other allergy screening: Asthma: no Rhino conjunctivitis: yes Food allergy: no Medication allergy: yes  Penicillin - hives Hymenoptera allergy: no Urticaria: no Eczema:no History of recurrent infections suggestive of immunodeficency: no  Diagnostics: Spirometry:  Tracings reviewed. His effort: It was hard to get consistent efforts and there is a question as to whether this reflects a maximal maneuver. FVC: 3.82L FEV1: 3.38L, 91% predicted FEV1/FVC ratio: 88% Interpretation: Spirometry consistent with possible restrictive disease.  Please see scanned spirometry results for details.  Skin Testing: Environmental allergy panel. Positive test to: intradermal testing slightly positive to grass, weed and borderline positive to molds.  Results discussed with patient/family. Airborne Adult Perc -  11/06/18 0920    Time Antigen Placed  0920    Allergen Manufacturer  Lavella Hammock    Location  Back    Number of Test  59     Panel 1  Select    1. Control-Buffer 50% Glycerol  Negative    2. Control-Histamine 1 mg/ml  2+    3. Albumin saline  Negative    4. [place]  Negative    5. [place]  Negative    6. Johnson  Negative    7. [place] Blue  Negative    8. Meadow Fescue  Negative    9. Perennial Rye  Negative    10. Sweet Vernal  Negative    11. Timothy  Negative    12. Cocklebur  Negative    13. Burweed Marshelder  Negative    14. Ragweed, short  Negative    15. Ragweed, Giant  Negative    16. Plantain,  English  Negative    17. Lamb's Quarters  Negative    18. Sheep Sorrell  Negative    19. Rough Pigweed  Negative    20. Marsh Elder, Rough  Negative    21. Mugwort, Common  Negative    22. Ash mix  Negative    23. Birch mix  Negative    24. Beech American  Negative    25. Box, Elder  Negative    26. Cedar, red  Negative    27. Cottonwood, [place]  Negative    28. Elm mix  Negative    29. Hickory mix  Negative    30. Maple mix  Negative    31. Oak, [place] mix  Negative    32. Pecan Pollen  Negative    33. Pine mix  Negative    34. Sycamore Eastern  Negative    35. Beau[place], Black Pollen  Negative    36. Alternaria alternata  Negative    37. Cladosporium Herbarum  Negative    38. Aspergillus mix  Negative    39. Penicillium mix  Negative    40. Bipolaris sorokiniana (Helminthosporium)  Negative    41. Drechslera spicifera (Curvularia)  Negative    42. Mucor plumbeus  Negative    43. Fusarium moniliforme  Negative    44. Aureobasidium pullulans (pullulara)  Negative    45. Rhizopus oryzae  Negative    46. Botrytis cinera  Negative    47. Epicoccum nigrum  Negative    48. Phoma betae  Negative    49. Candida Albicans  Negative    50. Trichophyton mentagrophytes  Negative    51. Mite, D Farinae  5,000 AU/ml  Negative    52. Mite, D Pteronyssinus  5,000 AU/ml  Negative    53. Cat Hair 10,000 BAU/ml  Negative    54.  Dog Epithelia  Negative    55. Mixed Feathers  Negative    56. Horse  Epithelia  Negative    57. Cockroach, German  Negative    58. Mouse  Negative    59. Tobacco Leaf  Negative     Intradermal - 11/06/18 0951    Time Antigen Placed  [id number]    Allergen Manufacturer  Lavella Hammock    Location  Arm    Number of Test  15    Intradermal  Select    Control  Negative    [place]atem[place]  Negative    Johnson  Negative    7 Grass  2+    Ragweed mix  Negative    Weed mix  2+    Tree mix  Negative    Mold 1  Negative    Mold 2  Negative    Mold 3  Negative    Mold 4  --   +/-   Cat  Negative    Dog  Negative    Cockroach  Negative    Mite mix  Negative       Past Medical History: Patient Active Problem List   Diagnosis Date Noted  . Coughing 11/06/2018  . Penicillin allergy 11/06/2018  . Hip flexor tightness 09/29/2018  . 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  . Hyperglycemia 08/02/2018  . HLD (hyperlipidemia) 08/02/2018  . Vitamin D deficiency 08/02/2018  . Preventative health care 07/30/2018  . Blood pressure elevated without history of HTN 07/30/2018  . Other allergic rhinitis 02/21/2015   Past Medical History:  Diagnosis Date  . Allergy   . Anemia   . Chronic kidney disease    Kidney Stone  . GERD (gastroesophageal reflux disease)   . Heart murmur    Past Surgical History: Past Surgical History:  Procedure Laterality Date  . APPENDECTOMY    . Arthroscopic knee surgeries on both knees    . CHOLECYSTECTOMY    . VASECTOMY     Medication List:  Current Outpatient Medications  Medication Sig Dispense Refill  . aspirin EC 81 MG tablet Take 81 mg by mouth at bedtime.    . cetirizine (ZYRTEC) 10 MG tablet Take 10 mg by mouth at bedtime.     . Cholecalciferol (VITAMIN D) 50 MCG (2000 UT) CAPS     . DHEA 50 MG TABS     . diphenhydrAMINE (BENADRYL) 25 MG tablet Take 25 mg by mouth 2 (two) times daily.     . Ginger, Zingiber officinalis, (GINGER ROOT) 550 MG CAPS     . Krill Oil 300 MG CAPS Take 300 mg by mouth at bedtime.     . Misc Natural  Products (TART CHERRY ADVANCED) CAPS     . Multiple Vitamin (MULTIVITAMIN WITH MINERALS) TABS tablet Take 1 tablet by mouth daily. Men's Over 83    . rosuvastatin (CRESTOR) 10 MG tablet Take 1 tablet (10 mg total) by mouth daily. 90 tablet 3  . telmisartan (MICARDIS) 80 MG tablet Take 1 tablet (80 mg total) by mouth daily. 90 tablet 3  . Turmeric Curcumin 500 MG CAPS Take 500 mg by mouth daily.    . Azelastine-Fluticasone 137-50 MCG/ACT SUSP Place 1 spray into the nose 2 (two) times daily. 23 g 5  . Inositol Niacinate (NIACIN FLUSH FREE) 500 MG CAPS Take 500 mg by mouth 2 (two) times daily.    . Vitamin D, Ergocalciferol, (DRISDOL) 1.25 MG (50000 UT) CAPS capsule Take 1 capsule (50,000 Units total) by mouth every 7 (seven) days. 12 capsule 0   No current facility-administered medications for this visit.    Allergies: Allergies  Allergen Reactions  . Penicillins Hives    Rash around neck  Has patient had a PCN reaction causing immediate rash, facial/tongue/throat swelling, SOB or lightheadedness with hypotension: Yes Has patient had a PCN reaction causing severe rash involving mucus membranes or skin necrosis: No Has patient had a PCN reaction that required hospitalization: No Has patient had a PCN reaction occurring within the last 10 years: No If all of the above answers are "NO", then may proceed with Cephalosporin use.   Social History: Social History   Socioeconomic History  .  Marital status: Married    Spouse name: Not on file  . Number of children: Not on file  . Years of education: Not on file  . Highest education level: Not on file  Occupational History  . Occupation: Print production planner, Financial controller, Semi-retired  Social Needs  . Financial resource strain: Not on file  . Food insecurity    Worry: Not on file    Inability: Not on file  . Transportation needs    Medical: Not on file    Non-medical: Not on file  Tobacco Use  . Smoking status: Heavy Tobacco Smoker    Packs/day: 0.00     Types: Cigars  . Smokeless tobacco: Never Used  Substance and Sexual Activity  . Alcohol use: Yes    Alcohol/week: 5.0 standard drinks    Types: 5 Standard drinks or equivalent per week  . Drug use: No  . Sexual activity: Yes  Lifestyle  . Physical activity    Days per week: Not on file    Minutes per session: Not on file  . Stress: Not on file  Relationships  . Social Herbalist on phone: Not on file    Gets together: Not on file    Attends religious service: Not on file    Active member of club or organization: Not on file    Attends meetings of clubs or organizations: Not on file    Relationship status: Not on file  Other Topics Concern  . Not on file  Social History Narrative   Married.   Education: College   Exercise: Yes   Lives in a 57 year old home. Smoking: very rarely Occupation: retired  Programme researcher, broadcasting/film/video HistoryFreight forwarder in the house: no Charity fundraiser in the family room: no Carpet in the bedroom: no Heating: gas Cooling: central Pet: yes 1 cat x 8 yrs  Family History: Family History  Adopted: Yes   Review of Systems  Constitutional: Negative for appetite change, chills, fever and unexpected weight change.  HENT: Positive for congestion, postnasal drip and sneezing. Negative for rhinorrhea.   Eyes: Negative for itching.  Respiratory: Positive for cough. Negative for chest tightness, shortness of breath and wheezing.   Cardiovascular: Negative for chest pain.  Gastrointestinal: Negative for abdominal pain.  Genitourinary: Negative for difficulty urinating.  Skin: Negative for rash.  Allergic/Immunologic: Positive for environmental allergies. Negative for food allergies.  Neurological: Negative for headaches.   Objective: BP 132/80 (BP Location: Left Arm, Patient Position: Sitting, Cuff Size: Normal)   Pulse 82   Temp 97.6 F (36.4 C) (Temporal)   Resp 17   Ht 5\' 10"  (1.778 m)   Wt 237 lb 8 oz (107.7 kg)   SpO2 96%   BMI 34.08  kg/m  Body mass index is 34.08 kg/m. Physical Exam  Constitutional: He is oriented to person, place, and time. He appears well-developed and well-nourished.  HENT:  Head: Normocephalic and atraumatic.  Right Ear: External ear normal.  Left Ear: External ear normal.  Nose: Nose normal.  Mouth/Throat: Oropharynx is clear and moist.  Eyes: Conjunctivae and EOM are normal.  Neck: Neck supple.  Cardiovascular: Normal rate, regular rhythm and normal heart sounds. Exam reveals no gallop and no friction rub.  No murmur heard. Pulmonary/Chest: Effort normal and breath sounds normal. He has no wheezes. He has no rales.  Abdominal: Soft.  Neurological: He is alert and oriented to person, place, and time.  Skin: Skin is warm. No rash noted.  Psychiatric:  He has a normal mood and affect. His behavior is normal.  Nursing note and vitals reviewed.  The plan was reviewed with the patient/family, and all questions/concerned were addressed.  It was my pleasure to see Patrick [name] today and participate in his care. Please feel free to contact me with any questions or concerns.  Sincerely,  Rexene Alberts, DO Allergy & Immunology  [facility] office: [phone] [facility] office: [place] office: [phone]

## 2018-11-06 ENCOUNTER — Encounter: Payer: Self-pay | Admitting: Allergy

## 2018-11-06 ENCOUNTER — Telehealth: Payer: Self-pay

## 2018-11-06 ENCOUNTER — Other Ambulatory Visit: Payer: Self-pay

## 2018-11-06 ENCOUNTER — Ambulatory Visit: Payer: BC Managed Care – PPO | Admitting: Allergy

## 2018-11-06 VITALS — BP 132/80 | HR 82 | Temp 97.6°F | Resp 17 | Ht 70.0 in | Wt 237.5 lb

## 2018-11-06 DIAGNOSIS — R05 Cough: Secondary | ICD-10-CM | POA: Diagnosis not present

## 2018-11-06 DIAGNOSIS — J3089 Other allergic rhinitis: Secondary | ICD-10-CM

## 2018-11-06 DIAGNOSIS — Z880 Allergy status to penicillin: Secondary | ICD-10-CM | POA: Diagnosis not present

## 2018-11-06 DIAGNOSIS — J329 Chronic sinusitis, unspecified: Secondary | ICD-10-CM | POA: Insufficient documentation

## 2018-11-06 DIAGNOSIS — R059 Cough, unspecified: Secondary | ICD-10-CM | POA: Insufficient documentation

## 2018-11-06 MED ORDER — AZELASTINE-FLUTICASONE 137-50 MCG/ACT NA SUSP: 1.0000 | Freq: Two times a day (BID) | NASAL | 5 refills | Status: DC

## 2018-11-06 NOTE — Assessment & Plan Note (Signed)
Hives with penicillin exposure in the past.  Continue avoidance and consider penicillin testing in future.

## 2018-11-06 NOTE — Telephone Encounter (Signed)
Prior authorization requested for Dymista. Submitted on covermymeds.

## 2018-11-06 NOTE — Patient Instructions (Addendum)
Today's skin testing showed:  Slightly positive to grass and weed pollen. Borderline to a few molds.  Start environmental control measures as below.    Nasal saline spray (i.e., Simply Saline) or nasal saline lavage (i.e., NeilMed) is recommended as needed and prior to medicated nasal sprays.  Start dymista 1 spray twice a day.   If it's not covered let us know.   May use over the counter antihistamines such as Zyrtec (cetirizine), Claritin (loratadine), Allegra (fexofenadine), or Xyzal (levocetirizine) daily as needed.  Call or message in 1 month to see if you need ENT referral.   Follow up in 2 months.  Reducing Pollen Exposure . Pollen seasons: trees (spring), grass (summer) and ragweed/weeds (fall). Marland Kitchen Keep windows closed in your home and car to lower pollen exposure.  Susa Simmonds air conditioning in the bedroom and throughout the house if possible.  . Avoid going out in dry windy days - especially early morning. . Pollen counts are highest between 5 - 10 AM and on dry, hot and windy days.  . Save outside activities for late afternoon or after a heavy rain, when pollen levels are lower.  . Avoid mowing of grass if you have grass pollen allergy. Marland Kitchen Be aware that pollen can also be transported indoors on people and pets.  . Dry your clothes in an automatic dryer rather than hanging them outside where they might collect pollen.  . Rinse hair and eyes before bedtime. Mold Control . Mold and fungi can grow on a variety of surfaces provided certain temperature and moisture conditions exist.  . Outdoor molds grow on plants, decaying vegetation and soil. The major outdoor mold, Alternaria and Cladosporium, are found in very high numbers during hot and dry conditions. Generally, a late summer - fall peak is seen for common outdoor fungal spores. Rain will temporarily lower outdoor mold spore count, but counts rise rapidly when the rainy period ends. . The most important indoor molds are  Aspergillus and Penicillium. Dark, humid and poorly ventilated basements are ideal sites for mold growth. The next most common sites of mold growth are the bathroom and the kitchen. Outdoor (Seasonal) Mold Control . Use air conditioning and keep windows closed. . Avoid exposure to decaying vegetation. Marland Kitchen Avoid leaf raking. . Avoid grain handling. . Consider wearing a face mask if working in moldy areas.  Indoor (Perennial) Mold Control  . Maintain humidity below 50%. . Get rid of mold growth on hard surfaces with water, detergent and, if necessary, 5% bleach (do not mix with other cleaners). Then dry the area completely. If mold covers an area more than 10 square feet, consider hiring an indoor environmental professional. . For clothing, washing with soap and water is best. If moldy items cannot be cleaned and dried, throw them away. . Remove sources e.g. contaminated carpets. . Repair and seal leaking roofs or pipes. Using dehumidifiers in damp basements may be helpful, but empty the water and clean units regularly to prevent mildew from forming. All rooms, especially basements, bathrooms and kitchens, require ventilation and cleaning to deter mold and mildew growth. Avoid carpeting on concrete or damp floors, and storing items in damp areas.

## 2018-11-06 NOTE — Assessment & Plan Note (Addendum)
Perennial rhinitis symptoms for the past 40+ years with worsening mainly at night.  Used to have 2 sinus infections per year but now using Nettie pot with good benefit.  Takes Zyrtec and Benadryl as needed.  No previous ENT evaluation.  Denies history of reflux or obstructive sleep apnea.  Today's skin testing was positive test to: intradermal testing slightly positive to grass, weed and borderline positive to molds.   Start environmental control measures.  Discussed with patient that given his symptomology and allergy testing results these may not be the main cause of his rhinitis symptoms  Nasal saline spray (i.e., Simply Saline) or nasal saline lavage (i.e., NeilMed) is recommended as needed and prior to medicated nasal sprays.  Start dymista 1 spray twice a day.  Demonstrated proper use  If it's not covered let us know.   May use over the counter antihistamines such as Zyrtec (cetirizine), Claritin (loratadine), Allegra (fexofenadine), or Xyzal (levocetirizine) daily as needed.  If not improved then will refer to ENT for further evaluation.

## 2018-11-06 NOTE — Assessment & Plan Note (Signed)
Coughing is most likely from his postnasal drip however there was a history of nebulizer use during upper respiratory infection.  Otherwise denies any respiratory symptoms.  Today's spirometry showed mild restriction most likely due to body habitus.  Monitor symptoms.

## 2018-11-11 NOTE — Telephone Encounter (Signed)
Patient's insurance has denied coverage of the Dymista. They say that he must try and fail fluticasone, azelastine and flunisolide. Please advise and thank you.

## 2018-11-12 ENCOUNTER — Telehealth: Payer: Self-pay | Admitting: Allergy

## 2018-11-12 MED ORDER — AZELASTINE HCL 0.1 % NA SOLN: 1.0000 | Freq: Two times a day (BID) | NASAL | 5 refills | Status: DC

## 2018-11-12 MED ORDER — FLUTICASONE PROPIONATE 50 MCG/ACT NA SUSP: 1.0000 | Freq: Two times a day (BID) | NASAL | 5 refills | Status: DC

## 2018-11-12 NOTE — Telephone Encounter (Signed)
There is now a generic for Dymista. Can we try and send that in to see if its covered?

## 2018-11-12 NOTE — Telephone Encounter (Signed)
Please call patient.  Tell him that dymista was not covered and send in Flonase 1 spray BID and azelastine 1 spray BID. Thank you.

## 2018-11-12 NOTE — Telephone Encounter (Signed)
Yes that's fine. Please send in generic 1 spray BID.

## 2018-11-12 NOTE — Telephone Encounter (Signed)
Called and spoke with the patient. He stated that the generic Dymista was not covered. Split up and sent in Azelastine and Flonase separately. Advised to patient. Patient verbalized understanding.

## 2018-11-12 NOTE — Telephone Encounter (Signed)
Patient was seen 11-06-18. He said he was prescribed Dymista. He said insurance will not cover that without trying other medications first. I saw in his chart that Azelastine-Fluticasone was sent in. He did not pick that up. He would like to know if he is supposed to take that or the Shannon.

## 2018-11-12 NOTE — Telephone Encounter (Signed)
See telephone contact from today for additional details.

## 2018-11-17 ENCOUNTER — Ambulatory Visit: Payer: BC Managed Care – PPO | Admitting: Family Medicine

## 2018-11-19 ENCOUNTER — Ambulatory Visit: Payer: BC Managed Care – PPO | Admitting: Family Medicine

## 2018-12-21 NOTE — Progress Notes (Signed)
[name] [place], [place] [address] Phone: [phone] Subjective:   Fontaine No, am serving as a scribe for Dr. Hulan Saas.   CC: Knee pain, hip pain follow-up  [id number]   09/29/2018 Chondromalacia bilaterally.  Discussed over-the-counter medications, we discussed topical anti-inflammatories and icing regimen, discussed continuing the over-the-counter natural supplementations.  Discussed the importance of weight loss and other exercises.  Follow-up again in 4 to 8 weeks  Bilateral hip flexor tightness.  Discussed with patient about posture and ergonomics.  We discussed with patient about weight loss.  Work with Product/process development scientist to learn home exercises in greater detail.  Once weekly vitamin D for strength and endurance with history of vitamin D deficiency.  Follow-up again in 4 to 8 weeks  Update 12/22/2018  [name] is a 58 y.o. male coming in with complaint of right knee and right hip pain. Patient states that his right knee is clicking and popping with no pain. Patient states that he feels a tightness in his knee. Has been using ice and IBU. Hip pain has improved since last visit.   Patient states over 80% improvement of the hip pain but only about 10% improvement of the knee pain.    Past Medical History:  Diagnosis Date  . Allergy   . Anemia   . Chronic kidney disease    Kidney Stone  . GERD (gastroesophageal reflux disease)   . Heart murmur    Past Surgical History:  Procedure Laterality Date  . APPENDECTOMY    . Arthroscopic knee surgeries on both knees    . CHOLECYSTECTOMY    . VASECTOMY     Social History   Socioeconomic History  . Marital status: Married    Spouse name: Not on file  . Number of children: Not on file  . Years of education: Not on file  . Highest education level: Not on file  Occupational History  . Occupation: Print production planner, Financial controller, Semi-retired  Social Needs  . Financial resource  strain: Not on file  . Food insecurity    Worry: Not on file    Inability: Not on file  . Transportation needs    Medical: Not on file    Non-medical: Not on file  Tobacco Use  . Smoking status: Heavy Tobacco Smoker    Packs/day: 0.00    Types: Cigars  . Smokeless tobacco: Never Used  Substance and Sexual Activity  . Alcohol use: Yes    Alcohol/week: 5.0 standard drinks    Types: 5 Standard drinks or equivalent per week  . Drug use: No  . Sexual activity: Yes  Lifestyle  . Physical activity    Days per week: Not on file    Minutes per session: Not on file  . Stress: Not on file  Relationships  . Social Herbalist on phone: Not on file    Gets together: Not on file    Attends religious service: Not on file    Active member of club or organization: Not on file    Attends meetings of clubs or organizations: Not on file    Relationship status: Not on file  Other Topics Concern  . Not on file  Social History Narrative   Married.   Education: College   Exercise: Yes   Allergies  Allergen Reactions  . Penicillins Hives    Rash around neck  Has patient had a PCN reaction causing immediate rash, facial/tongue/throat  swelling, SOB or lightheadedness with hypotension: Yes Has patient had a PCN reaction causing severe rash involving mucus membranes or skin necrosis: No Has patient had a PCN reaction that required hospitalization: No Has patient had a PCN reaction occurring within the last 10 years: No If all of the above answers are "NO", then may proceed with Cephalosporin use.   Family History  Adopted: Yes     Current Outpatient Medications (Cardiovascular):  .  rosuvastatin (CRESTOR) 10 MG tablet, Take 1 tablet (10 mg total) by mouth daily. Marland Kitchen  telmisartan (MICARDIS) 80 MG tablet, Take 1 tablet (80 mg total) by mouth daily. .  Inositol Niacinate (NIACIN FLUSH FREE) 500 MG CAPS, Take 500 mg by mouth 2 (two) times daily.  Current Outpatient Medications  (Respiratory):  .  azelastine (ASTELIN) 0.1 % nasal spray, Place 1 spray into both nostrils 2 (two) times daily. .  Azelastine-Fluticasone 137-50 MCG/ACT SUSP, Place 1 spray into the nose 2 (two) times daily. .  cetirizine (ZYRTEC) 10 MG tablet, Take 10 mg by mouth at bedtime.  .  diphenhydrAMINE (BENADRYL) 25 MG tablet, Take 25 mg by mouth 2 (two) times daily.  .  fluticasone (FLONASE) 50 MCG/ACT nasal spray, Place 1 spray into both nostrils 2 (two) times daily.  Current Outpatient Medications (Analgesics):  .  aspirin EC 81 MG tablet, Take 81 mg by mouth at bedtime.   Current Outpatient Medications (Other):  Marland Kitchen  Cholecalciferol (VITAMIN D) 50 MCG (2000 UT) CAPS,  .  DHEA 50 MG TABS,  .  Ginger, Zingiber officinalis, (GINGER ROOT) 550 MG CAPS,  .  Krill Oil 300 MG CAPS, Take 300 mg by mouth at bedtime.  .  Misc Natural Products (TART CHERRY ADVANCED) CAPS,  .  Multiple Vitamin (MULTIVITAMIN WITH MINERALS) TABS tablet, Take 1 tablet by mouth daily. Men's Over 38 .  Turmeric Curcumin 500 MG CAPS, Take 500 mg by mouth daily. .  Vitamin D, Ergocalciferol, (DRISDOL) 1.25 MG (50000 UT) CAPS capsule, Take 1 capsule (50,000 Units total) by mouth every 7 (seven) days.    Past medical history, social, surgical and family history all reviewed in electronic medical record.  No pertanent information unless stated regarding to the chief complaint.   Review of Systems:  No headache, visual changes, nausea, vomiting, diarrhea, constipation, dizziness, abdominal pain, skin rash, fevers, chills, night sweats, weight loss, swollen lymph nodes, body aches, joint swelling, muscle aches, chest pain, shortness of breath, mood changes.   Objective  Blood pressure 120/82, pulse (!) 104, height 5\' 10"  (1.778 m), weight 241 lb (109.3 kg), SpO2 97 %.    General: No apparent distress alert and oriented x3 mood and affect normal, dressed appropriately.  HEENT: Pupils equal, extraocular movements intact   Respiratory: Patient's speak in full sentences and does not appear short of breath  Cardiovascular: No lower extremity edema, non tender, no erythema  Skin: Warm dry intact with no signs of infection or rash on extremities or on axial skeleton.  Abdomen: Soft nontender  Neuro: Cranial nerves II through XII are intact, neurovascularly intact in all extremities with 2+ DTRs and 2+ pulses.  Lymph: No lymphadenopathy of posterior or anterior cervical chain or axillae bilaterally.  Gait normal with good balance and coordination.  MSK:  Non tender with full range of motion and good stability and symmetric strength and tone of shoulders, elbows, wrist, , and ankles bilaterally.  Bilateral knee exam still shows some lateral tracking with positive patella grind test.  No  significant instability of the knee itself. Significant improvement in the right hip range of motion.  Nontender on exam.  Less tightness of the hip flexor noted.  Negative straight leg test.   Impression and Recommendations:      The above documentation has been reviewed and is accurate and complete Lyndal Pulley, DO       Note: This dictation was prepared with Dragon dictation along with smaller phrase technology. Any transcriptional errors that result from this process are unintentional.

## 2018-12-22 ENCOUNTER — Other Ambulatory Visit: Payer: Self-pay

## 2018-12-22 ENCOUNTER — Encounter: Payer: Self-pay | Admitting: Family Medicine

## 2018-12-22 ENCOUNTER — Ambulatory Visit (INDEPENDENT_AMBULATORY_CARE_PROVIDER_SITE_OTHER)
Admission: RE | Admit: 2018-12-22 | Discharge: 2018-12-22 | Disposition: A | Discharge status: Home / Self Care | Payer: BC Managed Care – PPO | Source: Ambulatory Visit | Attending: Family Medicine | Admitting: Family Medicine

## 2018-12-22 ENCOUNTER — Ambulatory Visit (INDEPENDENT_AMBULATORY_CARE_PROVIDER_SITE_OTHER): Payer: BC Managed Care – PPO | Admitting: Family Medicine

## 2018-12-22 VITALS — BP 120/82 | HR 104 | Ht 70.0 in | Wt 241.0 lb

## 2018-12-22 DIAGNOSIS — G8929 Other chronic pain: Secondary | ICD-10-CM | POA: Diagnosis not present

## 2018-12-22 DIAGNOSIS — M2241 Chondromalacia patellae, right knee: Secondary | ICD-10-CM | POA: Diagnosis not present

## 2018-12-22 DIAGNOSIS — M25561 Pain in right knee: Secondary | ICD-10-CM

## 2018-12-22 DIAGNOSIS — M2242 Chondromalacia patellae, left knee: Secondary | ICD-10-CM

## 2018-12-22 DIAGNOSIS — M24559 Contracture, unspecified hip: Secondary | ICD-10-CM | POA: Diagnosis not present

## 2018-12-22 NOTE — Assessment & Plan Note (Signed)
Discussed patient with about the possibility of formal physical therapy or injections with patient declined.  Wanted to continue with conservative therapy for another 5 to 6 weeks.  Patient will try to do the exercises on a regular basis.  Follow-up at that time.

## 2018-12-22 NOTE — Assessment & Plan Note (Signed)
Improvement noted.  Can do daily activities with no trouble, only difficulty with walking long distances on a flat surface.  Continue to monitor.  No change in management.

## 2018-12-22 NOTE — Patient Instructions (Addendum)
See me again in 6-8 weeks if no better we will inject

## 2018-12-23 ENCOUNTER — Encounter: Payer: Self-pay | Admitting: Family Medicine

## 2019-01-05 ENCOUNTER — Telehealth: Payer: Self-pay

## 2019-01-05 NOTE — Telephone Encounter (Signed)
Patient called to cancel his appointment. Patient states the nasal sprays and things are not working. Patient would like to move forward with referral to ENT. Dr Maudie Mercury can you give me a diagnosis for the referral.  Thanks

## 2019-01-05 NOTE — Telephone Encounter (Signed)
Chronic rhinitis

## 2019-01-06 NOTE — Telephone Encounter (Signed)
Referral has been placed. I left a voicemail to inform the patient.   Thanks

## 2019-01-08 ENCOUNTER — Ambulatory Visit: Payer: BC Managed Care – PPO | Admitting: Allergy

## 2019-02-01 NOTE — Progress Notes (Signed)
[name] [place], [place] [address] Phone: [phone] Subjective:   Fontaine No, am serving as a scribe for Dr. Hulan Saas.   CC: Knee pain follow-up  [id number]   12/22/2018 Discussed patient with about the possibility of formal physical therapy or injections with patient declined.  Wanted to continue with conservative therapy for another 5 to 6 weeks.  Patient will try to do the exercises on a regular basis.  Follow-up at that time.  Update 02/02/2019 Patrick [name] is a 58 y.o. male coming in with complaint of right knee pain. Patient states that he is having clicking in the right knee. Is going to the gym and doing both cardio and weight training.   Is also having right shoulder pain for one month. Slept wrong. Sharp pain with flexion in anterior shoulder. No history of shoulder issues. Notices pain with pec flys using the machine.   Knee x-rays were taken December 22, 2018 they were independently visualized by me showing moderate patellofemoral and medial joint arthritis  Past Medical History:  Diagnosis Date  . Allergy   . Anemia   . Chronic kidney disease    Kidney Stone  . GERD (gastroesophageal reflux disease)   . Heart murmur    Past Surgical History:  Procedure Laterality Date  . APPENDECTOMY    . Arthroscopic knee surgeries on both knees    . CHOLECYSTECTOMY    . VASECTOMY     Social History   Socioeconomic History  . Marital status: Married    Spouse name: Not on file  . Number of children: Not on file  . Years of education: Not on file  . Highest education level: Not on file  Occupational History  . Occupation: Print production planner, Financial controller, Semi-retired  Social Needs  . Financial resource strain: Not on file  . Food insecurity    Worry: Not on file    Inability: Not on file  . Transportation needs    Medical: Not on file    Non-medical: Not on file  Tobacco Use  . Smoking status: Heavy Tobacco Smoker    Packs/day: 0.00    Types: Cigars  . Smokeless tobacco: Never Used  Substance and Sexual Activity  . Alcohol use: Yes    Alcohol/week: 5.0 standard drinks    Types: 5 Standard drinks or equivalent per week  . Drug use: No  . Sexual activity: Yes  Lifestyle  . Physical activity    Days per week: Not on file    Minutes per session: Not on file  . Stress: Not on file  Relationships  . Social Herbalist on phone: Not on file    Gets together: Not on file    Attends religious service: Not on file    Active member of club or organization: Not on file    Attends meetings of clubs or organizations: Not on file    Relationship status: Not on file  Other Topics Concern  . Not on file  Social History Narrative   Married.   Education: College   Exercise: Yes   Allergies  Allergen Reactions  . Penicillins Hives    Rash around neck  Has patient had a PCN reaction causing immediate rash, facial/tongue/throat swelling, SOB or lightheadedness with hypotension: Yes Has patient had a PCN reaction causing severe rash involving mucus membranes or skin necrosis: No Has patient had a PCN reaction that required hospitalization: No Has patient  had a PCN reaction occurring within the last 10 years: No If all of the above answers are "NO", then may proceed with Cephalosporin use.   Family History  Adopted: Yes     Current Outpatient Medications (Cardiovascular):  Marland Kitchen  Inositol Niacinate (NIACIN FLUSH FREE) 500 MG CAPS, Take 500 mg by mouth 2 (two) times daily. .  rosuvastatin (CRESTOR) 10 MG tablet, Take 1 tablet (10 mg total) by mouth daily. Marland Kitchen  telmisartan (MICARDIS) 80 MG tablet, Take 1 tablet (80 mg total) by mouth daily.  Current Outpatient Medications (Respiratory):  .  azelastine (ASTELIN) 0.1 % nasal spray, Place 1 spray into both nostrils 2 (two) times daily. .  Azelastine-Fluticasone 137-50 MCG/ACT SUSP, Place 1 spray into the nose 2 (two) times daily. .  cetirizine (ZYRTEC)  10 MG tablet, Take 10 mg by mouth at bedtime.  .  diphenhydrAMINE (BENADRYL) 25 MG tablet, Take 25 mg by mouth 2 (two) times daily.  .  fluticasone (FLONASE) 50 MCG/ACT nasal spray, Place 1 spray into both nostrils 2 (two) times daily.  Current Outpatient Medications (Analgesics):  .  aspirin EC 81 MG tablet, Take 81 mg by mouth at bedtime.   Current Outpatient Medications (Other):  Marland Kitchen  Cholecalciferol (VITAMIN D) 50 MCG (2000 UT) CAPS,  .  DHEA 50 MG TABS,  .  Ginger, Zingiber officinalis, (GINGER ROOT) 550 MG CAPS,  .  Krill Oil 300 MG CAPS, Take 300 mg by mouth at bedtime.  .  Misc Natural Products (TART CHERRY ADVANCED) CAPS,  .  Multiple Vitamin (MULTIVITAMIN WITH MINERALS) TABS tablet, Take 1 tablet by mouth daily. Men's Over 30 .  Turmeric Curcumin 500 MG CAPS, Take 500 mg by mouth daily. .  Vitamin D, Ergocalciferol, (DRISDOL) 1.25 MG (50000 UT) CAPS capsule, Take 1 capsule (50,000 Units total) by mouth every 7 (seven) days.    Past medical history, social, surgical and family history all reviewed in electronic medical record.  No pertanent information unless stated regarding to the chief complaint.   Review of Systems:  No headache, visual changes, nausea, vomiting, diarrhea, constipation, dizziness, abdominal pain, skin rash, fevers, chills, night sweats, weight loss, swollen lymph nodes, body aches, joint swelling, muscle aches, chest pain, shortness of breath, mood changes.   Objective  There were no vitals taken for this visit. Systems examined below as of    General: No apparent distress alert and oriented x3 mood and affect normal, dressed appropriately.  HEENT: Pupils equal, extraocular movements intact  Respiratory: Patient's speak in full sentences and does not appear short of breath  Cardiovascular: No lower extremity edema, non tender, no erythema  Skin: Warm dry intact with no signs of infection or rash on extremities or on axial skeleton.  Abdomen: Soft  nontender  Neuro: Cranial nerves II through XII are intact, neurovascularly intact in all extremities with 2+ DTRs and 2+ pulses.  Lymph: No lymphadenopathy of posterior or anterior cervical chain or axillae bilaterally.  Gait normal with good balance and coordination.  MSK:  Non tender with full range of motion and good stability and symmetric strength and tone of  elbows, wrist, hip, and ankles bilaterally.  Knee: Bilateral valgus deformity noted.  Abnormal thigh to calf ratio.  Tender to palpation over PF joint line.  ROM full in flexion and extension and lower leg rotation. painful patellar compression. Patellar glide with moderate crepitus. Patellar and quadriceps tendons unremarkable. Hamstring and quadriceps strength is normal.  Shoulder: Right Inspection reveals no abnormalities,  atrophy or asymmetry. Palpation is normal with no tenderness over AC joint or bicipital groove. ROM is full in all planes passively. Rotator cuff strength normal throughout. signs of impingement with positive Neer and Hawkin's tests, but negative empty can sign. Speeds and Yergason's tests normal. No labral pathology noted with negative Obrien's, negative clunk and good stability. Normal scapular function observed. No painful arc and no drop arm sign. No apprehension sign  MSK US performed of: Right This study was ordered, performed, and interpreted by Charlann Boxer D.O.  Shoulder:   Supraspinatus:  Appears normal on long and transverse views, Bursal bulge seen with shoulder abduction on impingement view. Infraspinatus:  Appears normal on long and transverse views. Significant increase in Doppler flow Subscapularis:  Appears normal on long and transverse views. Positive bursa Teres Minor:  Appears normal on long and transverse views. AC joint:  Capsule undistended, no geyser sign. Glenohumeral Joint:  Appears normal without effusion. Glenoid Labrum:  Intact without visualized tears. Biceps Tendon:   A hypoechoic changes noted within the tendon sheath  Impression: Subacromial bursitis biceps tendinitis  Procedure: Real-time Ultrasound Guided Injection of right glenohumeral joint Device: GE Logiq E  Ultrasound guided injection is preferred based studies that show increased duration, increased effect, greater accuracy, decreased procedural pain, increased response rate with ultrasound guided versus blind injection.  Verbal informed consent obtained.  Time-out conducted.  Noted no overlying erythema, induration, or other signs of local infection.  Skin prepped in a sterile fashion.  Local anesthesia: Topical Ethyl chloride.  With sterile technique and under real time ultrasound guidance:  Joint visualized.  23g 1  inch needle inserted posterior approach. Pictures taken for needle placement. Patient did have injection of 2 cc of 1% lidocaine, 2 cc of 0.5% Marcaine, and 1.0 cc of Kenalog 40 mg/dL. Completed without difficulty  Pain immediately resolved suggesting accurate placement of the medication.  Advised to call if fevers/chills, erythema, induration, drainage, or persistent bleeding.  Images permanently stored and available for review in the ultrasound unit.  Impression: Technically successful ultrasound guided injection.      Impression and Recommendations:     This case required medical decision making of moderate complexity. The above documentation has been reviewed and is accurate and complete Lyndal Pulley, DO       Note: This dictation was prepared with Dragon dictation along with smaller phrase technology. Any transcriptional errors that result from this process are unintentional.

## 2019-02-02 ENCOUNTER — Encounter: Payer: Self-pay | Admitting: Family Medicine

## 2019-02-02 ENCOUNTER — Other Ambulatory Visit: Payer: Self-pay

## 2019-02-02 ENCOUNTER — Ambulatory Visit: Payer: Self-pay

## 2019-02-02 ENCOUNTER — Ambulatory Visit: Payer: BC Managed Care – PPO | Admitting: Family Medicine

## 2019-02-02 VITALS — BP 112/82 | HR 87 | Ht 70.0 in | Wt 241.0 lb

## 2019-02-02 DIAGNOSIS — M2241 Chondromalacia patellae, right knee: Secondary | ICD-10-CM

## 2019-02-02 DIAGNOSIS — M25511 Pain in right shoulder: Secondary | ICD-10-CM

## 2019-02-02 DIAGNOSIS — M7551 Bursitis of right shoulder: Secondary | ICD-10-CM | POA: Insufficient documentation

## 2019-02-02 DIAGNOSIS — M2242 Chondromalacia patellae, left knee: Secondary | ICD-10-CM

## 2019-02-02 NOTE — Assessment & Plan Note (Signed)
Stable abdomen.  No significant changes.  Discussed which activities to do which wants to avoid.  Follow-up again 6 weeks.  Worsening pain consider injection

## 2019-02-02 NOTE — Assessment & Plan Note (Signed)
Patient given injection today, tolerated the procedure well.  Discussed icing regimen and home exercise, discussed topical anti-inflammatories, discussed ergonomics with working out.  Follow-up with me again in 4 to 8 weeks

## 2019-02-02 NOTE — Patient Instructions (Addendum)
Good to see you.  Injected shoulder Ice 20 minutes 2 times daily. Usually after activity and before bed. Exercises 3 times a week.  See me again in 6-8 weeks   [address]st floor [place], [place] [address] Phone [phone]

## 2019-02-04 DIAGNOSIS — J310 Chronic rhinitis: Secondary | ICD-10-CM | POA: Diagnosis not present

## 2019-02-04 DIAGNOSIS — J343 Hypertrophy of nasal turbinates: Secondary | ICD-10-CM | POA: Diagnosis not present

## 2019-02-04 DIAGNOSIS — J342 Deviated nasal septum: Secondary | ICD-10-CM | POA: Diagnosis not present

## 2019-03-12 ENCOUNTER — Other Ambulatory Visit: Payer: Self-pay | Admitting: Otolaryngology

## 2019-03-16 ENCOUNTER — Other Ambulatory Visit: Payer: Self-pay

## 2019-03-16 ENCOUNTER — Encounter (HOSPITAL_BASED_OUTPATIENT_CLINIC_OR_DEPARTMENT_OTHER): Payer: Self-pay | Admitting: Otolaryngology

## 2019-03-19 ENCOUNTER — Other Ambulatory Visit: Payer: Self-pay

## 2019-03-19 ENCOUNTER — Other Ambulatory Visit (HOSPITAL_COMMUNITY)
Admission: RE | Admit: 2019-03-19 | Discharge: 2019-03-19 | Disposition: A | Discharge status: Home / Self Care | Payer: 59 | Source: Ambulatory Visit | Attending: Otolaryngology | Admitting: Otolaryngology

## 2019-03-19 ENCOUNTER — Encounter (HOSPITAL_BASED_OUTPATIENT_CLINIC_OR_DEPARTMENT_OTHER)
Admission: RE | Admit: 2019-03-19 | Discharge: 2019-03-19 | Disposition: A | Discharge status: Home / Self Care | Payer: 59 | Source: Ambulatory Visit | Attending: Otolaryngology | Admitting: Otolaryngology

## 2019-03-19 DIAGNOSIS — Z01812 Encounter for preprocedural laboratory examination: Secondary | ICD-10-CM | POA: Diagnosis not present

## 2019-03-19 DIAGNOSIS — U071 COVID-19: Secondary | ICD-10-CM | POA: Insufficient documentation

## 2019-03-19 LAB — BASIC METABOLIC PANEL
Anion gap: 10 (ref 5–15)
BUN: 13 mg/dL (ref 6–20)
CO2: 24 mmol/L (ref 22–32)
Calcium: 9 mg/dL (ref 8.9–10.3)
Chloride: 104 mmol/L (ref 98–111)
Creatinine, Ser: 1.02 mg/dL (ref 0.61–1.24)
GFR calc Af Amer: >60 mL/min (ref >60)
GFR calc non Af Amer: >60 mL/min (ref >60)
Glucose, Bld: 132 mg/dL — ABNORMAL HIGH (ref 70–99)
Potassium: 3.8 mmol/L (ref 3.5–5.1)
Sodium: 138 mmol/L (ref 135–145)

## 2019-03-20 LAB — NOVEL CORONAVIRUS, NAA (HOSP ORDER, SEND-OUT TO REF LAB; TAT 18-24 HRS): SARS-CoV-2, NAA: DETECTED — AB

## 2019-03-21 NOTE — Progress Notes (Signed)
Notified Dr Benjamine Mola that patient pre procedural covid swab was + from 03/19/19.  Dr Benjamine Mola states he will notify patient.

## 2019-03-24 ENCOUNTER — Ambulatory Visit: Payer: BC Managed Care – PPO | Admitting: Family Medicine

## 2019-03-31 ENCOUNTER — Ambulatory Visit: Payer: 59 | Admitting: Family Medicine

## 2019-04-06 ENCOUNTER — Encounter (HOSPITAL_BASED_OUTPATIENT_CLINIC_OR_DEPARTMENT_OTHER): Payer: Self-pay | Admitting: Otolaryngology

## 2019-04-08 ENCOUNTER — Ambulatory Visit (INDEPENDENT_AMBULATORY_CARE_PROVIDER_SITE_OTHER): Payer: 59 | Admitting: Family Medicine

## 2019-04-08 ENCOUNTER — Ambulatory Visit (INDEPENDENT_AMBULATORY_CARE_PROVIDER_SITE_OTHER): Payer: 59

## 2019-04-08 ENCOUNTER — Encounter: Payer: Self-pay | Admitting: Family Medicine

## 2019-04-08 ENCOUNTER — Other Ambulatory Visit: Payer: Self-pay

## 2019-04-08 VITALS — BP 100/80 | HR 78 | Ht 70.0 in | Wt 243.0 lb

## 2019-04-08 DIAGNOSIS — G8929 Other chronic pain: Secondary | ICD-10-CM | POA: Diagnosis not present

## 2019-04-08 DIAGNOSIS — M25511 Pain in right shoulder: Secondary | ICD-10-CM

## 2019-04-08 DIAGNOSIS — M7551 Bursitis of right shoulder: Secondary | ICD-10-CM

## 2019-04-08 DIAGNOSIS — M19011 Primary osteoarthritis, right shoulder: Secondary | ICD-10-CM

## 2019-04-08 DIAGNOSIS — M19019 Primary osteoarthritis, unspecified shoulder: Secondary | ICD-10-CM | POA: Insufficient documentation

## 2019-04-08 NOTE — Progress Notes (Signed)
[facility] [address] [place] Phone: [phone] Subjective:   I Patrick [name] am serving as a Education administrator for Dr. Hulan Saas.  This visit occurred during the SARS-CoV-2 public health emergency.  Safety protocols were in place, including screening questions prior to the visit, additional usage of staff PPE, and extensive cleaning of exam room while observing appropriate contact time as indicated for disinfecting solutions.   I'm seeing this patient by the request  of:  Biagio Borg, MD  CC: Right shoulder pain follow-up  [id number]   02/02/2019 Patient given injection today, tolerated the procedure well.  Discussed icing regimen and home exercise, discussed topical anti-inflammatories, discussed ergonomics with working out.  Follow-up with me again in 4 to 8 weeks    Patrick [name] is a 59 y.o. male coming in with complaint of right shoulder pain. Patient states he has noticed little improvement. Pain with certain ROM. Pec exercise is painful.  Patient since none affecting daily activities.  Still having some mild discomfort from time to time but nothing severe.     Past Medical History:  Diagnosis Date  . Allergy   . Heart murmur   . History of kidney stones   . Hypertension    Past Surgical History:  Procedure Laterality Date  . APPENDECTOMY    . Arthroscopic knee surgeries on both knees    . CHOLECYSTECTOMY    . VASECTOMY     Social History   Socioeconomic History  . Marital status: Married    Spouse name: Not on file  . Number of children: Not on file  . Years of education: Not on file  . Highest education level: Not on file  Occupational History  . Occupation: Print production planner, Financial controller, Semi-retired  Tobacco Use  . Smoking status: Former Smoker    Packs/day: 0.00    Types: Cigars  . Smokeless tobacco: Never Used  Substance and Sexual Activity  . Alcohol use: Yes    Alcohol/week: 5.0 standard drinks    Types: 5  Standard drinks or equivalent per week  . Drug use: No  . Sexual activity: Yes  Other Topics Concern  . Not on file  Social History Narrative   Married.   Education: [employer]   Exercise: Yes   Social Determinants of Health   Financial Resource Strain:   . Difficulty of Paying Living Expenses: Not on file  Food Insecurity:   . Worried About Charity fundraiser in the Last Year: Not on file  . Ran Out of Food in the Last Year: Not on file  Transportation Needs:   . Lack of Transportation (Medical): Not on file  . Lack of Transportation (Non-Medical): Not on file  Physical Activity:   . Days of Exercise per Week: Not on file  . Minutes of Exercise per Session: Not on file  Stress:   . Feeling of Stress : Not on file  Social Connections:   . Frequency of Communication with Friends and Family: Not on file  . Frequency of Social Gatherings with Friends and Family: Not on file  . Attends Religious Services: Not on file  . Active Member of Clubs or Organizations: Not on file  . Attends Archivist Meetings: Not on file  . Marital Status: Not on file   Allergies  Allergen Reactions  . Penicillins Hives    Rash around neck  Has patient had a PCN reaction causing immediate rash, facial/tongue/throat swelling, SOB  or lightheadedness with hypotension: Yes Has patient had a PCN reaction causing severe rash involving mucus membranes or skin necrosis: No Has patient had a PCN reaction that required hospitalization: No Has patient had a PCN reaction occurring within the last 10 years: No If all of the above answers are "NO", then may proceed with Cephalosporin use.   Family History  Adopted: Yes     Current Outpatient Medications (Cardiovascular):  .  rosuvastatin (CRESTOR) 10 MG tablet, Take 1 tablet (10 mg total) by mouth daily. Marland Kitchen  telmisartan (MICARDIS) 80 MG tablet, Take 1 tablet (80 mg total) by mouth daily.  Current Outpatient Medications (Respiratory):  .   azelastine (ASTELIN) 0.1 % nasal spray, Place 1 spray into both nostrils 2 (two) times daily. .  Azelastine-Fluticasone 137-50 MCG/ACT SUSP, Place 1 spray into the nose 2 (two) times daily. .  cetirizine (ZYRTEC) 10 MG tablet, Take 10 mg by mouth at bedtime.  .  fluticasone (FLONASE) 50 MCG/ACT nasal spray, Place 1 spray into both nostrils 2 (two) times daily.  Current Outpatient Medications (Analgesics):  .  aspirin EC 81 MG tablet, Take 81 mg by mouth at bedtime.   Current Outpatient Medications (Other):  Marland Kitchen  Cholecalciferol (VITAMIN D) 50 MCG (2000 UT) CAPS,  .  Ginger, Zingiber officinalis, (GINGER ROOT) 550 MG CAPS,  .  Krill Oil 300 MG CAPS, Take 300 mg by mouth at bedtime.  .  Misc Natural Products (TART CHERRY ADVANCED) CAPS,  .  Multiple Vitamin (MULTIVITAMIN WITH MINERALS) TABS tablet, Take 1 tablet by mouth daily. Men's Over 66 .  Turmeric Curcumin 500 MG CAPS, Take 500 mg by mouth daily.   Reviewed prior external information including notes and imaging from  primary care provider As well as notes that were available from care everywhere and other healthcare systems.  Past medical history, social, surgical and family history all reviewed in electronic medical record.  No pertanent information unless stated regarding to the chief complaint.   Review of Systems:  No headache, visual changes, nausea, vomiting, diarrhea, constipation, dizziness, abdominal pain, skin rash, fevers, chills, night sweats, weight loss, swollen lymph nodes, body aches, joint swelling, chest pain, shortness of breath, mood changes. POSITIVE muscle aches  Objective  Blood pressure 100/80, pulse 78, height 5\' 10"  (1.778 m), weight 243 lb (110.2 kg), SpO2 98 %.   General: No apparent distress alert and oriented x3 mood and affect normal, dressed appropriately.  HEENT: Pupils equal, extraocular movements intact  Respiratory: Patient's speak in full sentences and does not appear short of breath   Cardiovascular: No lower extremity edema, non tender, no erythema  Skin: Warm dry intact with no signs of infection or rash on extremities or on axial skeleton.  Abdomen: Soft nontender  Neuro: Cranial nerves II through XII are intact, neurovascularly intact in all extremities with 2+ DTRs and 2+ pulses.  Lymph: No lymphadenopathy of posterior or anterior cervical chain or axillae bilaterally.  Gait normal with good balance and coordination.  MSK:  tender with full range of motion and good stability and symmetric strength and tone of , elbows, wrist, hip, knee and ankles bilaterally.  Right shoulder exam does have some mild positive impingement still noted.  Patient does have some mild pain with apprehension as well.  Mild positive crossover.  Limited musculoskeletal ultrasound was performed and interpreted by Lyndal Pulley  Limited ultrasound shows the patient does have what appears to be hypoechoic changes with mild narrowing of the acromioclavicular joint  which is new from previous exam.  Patient shoulder itself so do not see any significant rotator cuff tear noted, and no significant subacromial bursitis noted.  Very mild calcific changes noted of the insertion of the subscapularis and the supraspinatus. 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 The above documentation has been reviewed and is accurate and complete Lyndal Pulley, DO       Note: This dictation was prepared with Dragon dictation along with smaller phrase technology. Any transcriptional errors that result from this process are unintentional.

## 2019-04-08 NOTE — Patient Instructions (Addendum)
Good to see you Pennsaid small amount over painful area See me again in 6-8 weeks if not better we will try AC injection and discuss MRI

## 2019-04-08 NOTE — Assessment & Plan Note (Signed)
Patient on ultrasound today did not show any significant inflammation at the moment.  I do see some mild inflammation of the acromioclavicular joint we discussed the possibility of an injection.  The patient has decided not to do that at the moment.  We discussed increasing activity as tolerated.  Patient will follow-up again in 6 to 8 weeks.  Worsening pain and will consider injections or advanced imaging.  Patient is a.

## 2019-04-08 NOTE — Assessment & Plan Note (Signed)
Mild overall.  Will monitor worsening symptoms consider injection.

## 2019-04-09 ENCOUNTER — Other Ambulatory Visit (HOSPITAL_COMMUNITY): Payer: 59

## 2019-04-13 ENCOUNTER — Encounter (HOSPITAL_BASED_OUTPATIENT_CLINIC_OR_DEPARTMENT_OTHER)
Admission: RE | Disposition: A | Discharge status: Home / Self Care | Payer: Self-pay | Source: Home / Self Care | Attending: Otolaryngology

## 2019-04-13 ENCOUNTER — Other Ambulatory Visit: Payer: Self-pay

## 2019-04-13 ENCOUNTER — Ambulatory Visit (HOSPITAL_BASED_OUTPATIENT_CLINIC_OR_DEPARTMENT_OTHER): Payer: 59 | Admitting: Anesthesiology

## 2019-04-13 ENCOUNTER — Encounter (HOSPITAL_BASED_OUTPATIENT_CLINIC_OR_DEPARTMENT_OTHER): Payer: Self-pay | Admitting: Otolaryngology

## 2019-04-13 ENCOUNTER — Ambulatory Visit (HOSPITAL_BASED_OUTPATIENT_CLINIC_OR_DEPARTMENT_OTHER)
Admission: RE | Admit: 2019-04-13 | Discharge: 2019-04-13 | Disposition: A | Discharge status: Home / Self Care | Payer: 59 | Attending: Otolaryngology | Admitting: Otolaryngology

## 2019-04-13 DIAGNOSIS — I10 Essential (primary) hypertension: Secondary | ICD-10-CM | POA: Insufficient documentation

## 2019-04-13 DIAGNOSIS — Z79899 Other long term (current) drug therapy: Secondary | ICD-10-CM | POA: Insufficient documentation

## 2019-04-13 DIAGNOSIS — Z8616 Personal history of COVID-19: Secondary | ICD-10-CM | POA: Insufficient documentation

## 2019-04-13 DIAGNOSIS — Z87891 Personal history of nicotine dependence: Secondary | ICD-10-CM | POA: Diagnosis not present

## 2019-04-13 DIAGNOSIS — M1990 Unspecified osteoarthritis, unspecified site: Secondary | ICD-10-CM | POA: Insufficient documentation

## 2019-04-13 DIAGNOSIS — J3489 Other specified disorders of nose and nasal sinuses: Secondary | ICD-10-CM | POA: Diagnosis present

## 2019-04-13 DIAGNOSIS — J343 Hypertrophy of nasal turbinates: Secondary | ICD-10-CM | POA: Diagnosis not present

## 2019-04-13 DIAGNOSIS — J342 Deviated nasal septum: Secondary | ICD-10-CM | POA: Diagnosis not present

## 2019-04-13 DIAGNOSIS — Z7982 Long term (current) use of aspirin: Secondary | ICD-10-CM | POA: Diagnosis not present

## 2019-04-13 HISTORY — DX: Essential (primary) hypertension: I10

## 2019-04-13 HISTORY — DX: Personal history of urinary calculi: Z87.442

## 2019-04-13 HISTORY — PX: NASAL SEPTOPLASTY W/ TURBINOPLASTY: SHX2070

## 2019-04-13 SURGERY — SEPTOPLASTY, NOSE, WITH NASAL TURBINATE REDUCTION
Anesthesia: General | Site: Nose | Laterality: Bilateral

## 2019-04-13 MED ORDER — OXYMETAZOLINE HCL 0.05 % NA SOLN
NASAL | Status: DC | PRN
  Administered 2019-04-13: 1 via TOPICAL

## 2019-04-13 MED ORDER — ROCURONIUM BROMIDE 10 MG/ML (PF) SYRINGE
PREFILLED_SYRINGE | INTRAVENOUS | Status: AC
  Filled 2019-04-13: qty 10

## 2019-04-13 MED ORDER — DEXAMETHASONE SODIUM PHOSPHATE 4 MG/ML IJ SOLN
INTRAMUSCULAR | Status: DC | PRN
  Administered 2019-04-13: 10 mg via INTRAVENOUS

## 2019-04-13 MED ORDER — ONDANSETRON HCL 4 MG/2ML IJ SOLN
INTRAMUSCULAR | Status: DC | PRN
  Administered 2019-04-13: 4 mg via INTRAVENOUS

## 2019-04-13 MED ORDER — CLINDAMYCIN PHOSPHATE 900 MG/50ML IV SOLN
INTRAVENOUS | Status: DC | PRN
  Administered 2019-04-13: 900 mg via INTRAVENOUS

## 2019-04-13 MED ORDER — CLINDAMYCIN PHOSPHATE 900 MG/50ML IV SOLN
INTRAVENOUS | Status: AC
  Filled 2019-04-13: qty 50

## 2019-04-13 MED ORDER — ONDANSETRON HCL 4 MG/2ML IJ SOLN
INTRAMUSCULAR | Status: AC
  Filled 2019-04-13: qty 2

## 2019-04-13 MED ORDER — LIDOCAINE-EPINEPHRINE 1 %-1:100000 IJ SOLN
INTRAMUSCULAR | Status: DC | PRN
  Administered 2019-04-13: 3.5 mL

## 2019-04-13 MED ORDER — LACTATED RINGERS IV SOLN: INTRAVENOUS | Status: DC

## 2019-04-13 MED ORDER — FENTANYL CITRATE (PF) 100 MCG/2ML IJ SOLN
50.0000 ug | INTRAMUSCULAR | Status: DC | PRN
  Administered 2019-04-13: 100 ug via INTRAVENOUS

## 2019-04-13 MED ORDER — MIDAZOLAM HCL 2 MG/2ML IJ SOLN
1.0000 mg | INTRAMUSCULAR | Status: DC | PRN
  Administered 2019-04-13: 09:00:00 2 mg via INTRAVENOUS

## 2019-04-13 MED ORDER — LIDOCAINE 2% (20 MG/ML) 5 ML SYRINGE
INTRAMUSCULAR | Status: DC | PRN
  Administered 2019-04-13: 40 mg via INTRAVENOUS
  Administered 2019-04-13: 60 mg via INTRAVENOUS

## 2019-04-13 MED ORDER — OXYCODONE HCL 5 MG/5ML PO SOLN: 5.0000 mg | Freq: Once | ORAL | Status: DC | PRN

## 2019-04-13 MED ORDER — SUCCINYLCHOLINE CHLORIDE 20 MG/ML IJ SOLN
INTRAMUSCULAR | Status: DC | PRN
  Administered 2019-04-13: 100 mg via INTRAVENOUS

## 2019-04-13 MED ORDER — LIDOCAINE 2% (20 MG/ML) 5 ML SYRINGE
INTRAMUSCULAR | Status: AC
  Filled 2019-04-13: qty 5

## 2019-04-13 MED ORDER — MUPIROCIN 2 % EX OINT
TOPICAL_OINTMENT | CUTANEOUS | Status: DC | PRN
  Administered 2019-04-13: 1 via TOPICAL

## 2019-04-13 MED ORDER — EPHEDRINE SULFATE-NACL 50-0.9 MG/10ML-% IV SOSY
PREFILLED_SYRINGE | INTRAVENOUS | Status: DC | PRN
  Administered 2019-04-13: 10 mg via INTRAVENOUS

## 2019-04-13 MED ORDER — OXYCODONE-ACETAMINOPHEN 5-325 MG PO TABS: 1.0000 | ORAL_TABLET | ORAL | 0 refills | Status: AC | PRN

## 2019-04-13 MED ORDER — LIDOCAINE-EPINEPHRINE 1 %-1:100000 IJ SOLN
INTRAMUSCULAR | Status: AC
  Filled 2019-04-13: qty 1

## 2019-04-13 MED ORDER — OXYCODONE HCL 5 MG PO TABS: 5.0000 mg | ORAL_TABLET | Freq: Once | ORAL | Status: DC | PRN

## 2019-04-13 MED ORDER — DEXAMETHASONE SODIUM PHOSPHATE 10 MG/ML IJ SOLN
INTRAMUSCULAR | Status: AC
  Filled 2019-04-13: qty 1

## 2019-04-13 MED ORDER — PHENYLEPHRINE 40 MCG/ML (10ML) SYRINGE FOR IV PUSH (FOR BLOOD PRESSURE SUPPORT)
PREFILLED_SYRINGE | INTRAVENOUS | Status: DC | PRN
  Administered 2019-04-13 (×3): 80 ug via INTRAVENOUS

## 2019-04-13 MED ORDER — FENTANYL CITRATE (PF) 100 MCG/2ML IJ SOLN
INTRAMUSCULAR | Status: AC
  Filled 2019-04-13: qty 2

## 2019-04-13 MED ORDER — ONDANSETRON HCL 4 MG/2ML IJ SOLN: 4.0000 mg | Freq: Once | INTRAMUSCULAR | Status: DC | PRN

## 2019-04-13 MED ORDER — FENTANYL CITRATE (PF) 100 MCG/2ML IJ SOLN: 25.0000 ug | INTRAMUSCULAR | Status: DC | PRN

## 2019-04-13 MED ORDER — SUGAMMADEX SODIUM 200 MG/2ML IV SOLN
INTRAVENOUS | Status: DC | PRN
  Administered 2019-04-13: 200 mg via INTRAVENOUS

## 2019-04-13 MED ORDER — MIDAZOLAM HCL 2 MG/2ML IJ SOLN
INTRAMUSCULAR | Status: AC
  Filled 2019-04-13: qty 2

## 2019-04-13 MED ORDER — ROCURONIUM BROMIDE 50 MG/5ML IV SOSY
PREFILLED_SYRINGE | INTRAVENOUS | Status: DC | PRN
  Administered 2019-04-13: 50 mg via INTRAVENOUS

## 2019-04-13 MED ORDER — PROPOFOL 10 MG/ML IV BOLUS
INTRAVENOUS | Status: DC | PRN
  Administered 2019-04-13: 50 mg via INTRAVENOUS
  Administered 2019-04-13: 200 mg via INTRAVENOUS

## 2019-04-13 MED ORDER — CLINDAMYCIN HCL 300 MG PO CAPS: 300.0000 mg | ORAL_CAPSULE | Freq: Three times a day (TID) | ORAL | 0 refills | Status: AC

## 2019-04-13 SURGICAL SUPPLY — 33 items
ATTRACTOMAT 16X20 MAGNETIC DRP (DRAPES) IMPLANT
CANISTER SUCT 1200ML W/VALVE (MISCELLANEOUS) ×4 IMPLANT
COAGULATOR SUCT 8FR VV (MISCELLANEOUS) ×2 IMPLANT
COVER WAND RF STERILE (DRAPES) IMPLANT
DECANTER SPIKE VIAL GLASS SM (MISCELLANEOUS) IMPLANT
DRSG NASOPORE 8CM (GAUZE/BANDAGES/DRESSINGS) IMPLANT
DRSG TELFA 3X8 NADH (GAUZE/BANDAGES/DRESSINGS) IMPLANT
ELECT REM PT RETURN 9FT ADLT (ELECTROSURGICAL) ×2
ELECTRODE REM PT RTRN 9FT ADLT (ELECTROSURGICAL) ×1 IMPLANT
GLOVE BIO SURGEON STRL SZ7 (GLOVE) ×2 IMPLANT
GLOVE BIO SURGEON STRL SZ7.5 (GLOVE) ×2 IMPLANT
GOWN STRL REUS W/ TWL LRG LVL3 (GOWN DISPOSABLE) ×1 IMPLANT
GOWN STRL REUS W/ TWL XL LVL3 (GOWN DISPOSABLE) ×1 IMPLANT
GOWN STRL REUS W/TWL LRG LVL3 (GOWN DISPOSABLE) ×1
GOWN STRL REUS W/TWL XL LVL3 (GOWN DISPOSABLE) ×1
NEEDLE HYPO 25X1 1.5 SAFETY (NEEDLE) ×2 IMPLANT
NS IRRIG 1000ML POUR BTL (IV SOLUTION) ×2 IMPLANT
PACK BASIN DAY SURGERY FS (CUSTOM PROCEDURE TRAY) ×2 IMPLANT
PACK ENT DAY SURGERY (CUSTOM PROCEDURE TRAY) ×2 IMPLANT
SLEEVE SCD COMPRESS KNEE MED (MISCELLANEOUS) ×2 IMPLANT
SOLUTION BUTLER CLEAR DIP (MISCELLANEOUS) ×2 IMPLANT
SPLINT NASAL AIRWAY SILICONE (MISCELLANEOUS) ×2 IMPLANT
SPONGE GAUZE 2X2 8PLY STRL LF (GAUZE/BANDAGES/DRESSINGS) ×2 IMPLANT
SPONGE NEURO XRAY DETECT 1X3 (DISPOSABLE) ×2 IMPLANT
SUT CHROMIC 4 0 P 3 18 (SUTURE) ×2 IMPLANT
SUT PLAIN 4 0 ~~LOC~~ 1 (SUTURE) ×2 IMPLANT
SUT PROLENE 3 0 PS 2 (SUTURE) ×2 IMPLANT
SUT VIC AB 4-0 P-3 18XBRD (SUTURE) IMPLANT
SUT VIC AB 4-0 P3 18 (SUTURE)
TOWEL GREEN STERILE FF (TOWEL DISPOSABLE) ×2 IMPLANT
TUBE SALEM SUMP 12R W/ARV (TUBING) IMPLANT
TUBE SALEM SUMP 16 FR W/ARV (TUBING) ×2 IMPLANT
YANKAUER SUCT BULB TIP NO VENT (SUCTIONS) ×2 IMPLANT

## 2019-04-13 NOTE — Anesthesia Preprocedure Evaluation (Signed)
Anesthesia Evaluation  Patient identified by MRN, date of birth, ID band Patient awake    Reviewed: Allergy & Precautions, NPO status , Patient's Chart, lab work & pertinent test results  History of Anesthesia Complications Negative for: history of anesthetic complications  Airway Mallampati: II  TM Distance: >3 FB Neck ROM: Full    Dental  (+)    Pulmonary neg pulmonary ROS, former smoker,    Pulmonary exam normal        Cardiovascular hypertension, Pt. on medications Normal cardiovascular exam     Neuro/Psych negative neurological ROS  negative psych ROS   GI/Hepatic negative GI ROS, Neg liver ROS,   Endo/Other  negative endocrine ROS  Renal/GU negative Renal ROS  negative genitourinary   Musculoskeletal negative musculoskeletal ROS (+)   Abdominal   Peds  Hematology negative hematology ROS (+)   Anesthesia Other Findings Positive for COVID-19 on 03/19/19  Reproductive/Obstetrics                            Anesthesia Physical Anesthesia Plan  ASA: II  Anesthesia Plan: General   Post-op Pain Management:    Induction: Intravenous  PONV Risk Score and Plan: 2 and Ondansetron, Dexamethasone, Treatment may vary due to age or medical condition and Midazolam  Airway Management Planned: Oral ETT  Additional Equipment: None  Intra-op Plan:   Post-operative Plan: Extubation in OR  Informed Consent: I have reviewed the patients History and Physical, chart, labs and discussed the procedure including the risks, benefits and alternatives for the proposed anesthesia with the patient or authorized representative who has indicated his/her understanding and acceptance.     Dental advisory given  Plan Discussed with:   Anesthesia Plan Comments:        Anesthesia Quick Evaluation

## 2019-04-13 NOTE — H&P (Signed)
Cc: Chronic nasal obstruction   HPI: The patient is a 59 y/o male who presents today for evaluation of chronic nasal congestion and post nasal drainage. The patient is seen in consultation requested by Dr. Rexene Alberts. The patient has noted nasal symptoms for many years. However his symptoms have gotten progressively worse over the past year. The patient notes significant nighttime congestion and excess sinus drainage in the mornings. His wife notes that he coughs and snores at night. No apnea has been noted. The patient underwent allergy testing with mild environmental allergens noted. He was placed on Flonase and Astelin 6 weeks ago with some improvement in his post nasal drainage. The patient currently denies facial pain or pressure. He has had sinus infections in the past but has learned to prevent them with saline irrigation. The patient was adopted and his birth records indicated that he had a septal deviation at birth.   The patient's review of systems (constitutional, eyes, ENT, cardiovascular, respiratory, GI, musculoskeletal, skin, neurologic, psychiatric, endocrine, hematologic, allergic) is noted in the ROS questionnaire.  It is reviewed with the patient.   Family health history: Unknown, patient was adopted.  Major events: Knee surgery X3, appendectomy, gallbladder removed.  Ongoing medical problems: Hypertension, arthritis, allergies.  Social history: The patient is married. He denies the use of tobacco or illegal drugs. He drinks alcohol a few times a week.   Exam: General: Communicates without difficulty, well nourished, no acute distress. Head: Normocephalic, no evidence injury, no tenderness, facial buttresses intact without stepoff. Eyes: PERRL, EOMI. No scleral icterus, conjunctivae clear. Neuro: CN II exam reveals vision grossly intact.  No nystagmus at any point of gaze. Ears: Auricles well formed without lesions.  Ear canals are intact without mass or lesion.  No erythema or edema  is appreciated.  The TMs are intact without fluid. Nose: External evaluation reveals normal support and skin without lesions.  Dorsum is intact.  Anterior rhinoscopy reveals congested and edematous mucosa over anterior aspect of the inferior turbinates and nasal septum.  No purulence is noted. Middle meatus is not well visualized. Oral:  Oral cavity and oropharynx are intact, symmetric, without erythema or edema.  Mucosa is moist without lesions. Neck: Full range of motion without pain.  There is no significant lymphadenopathy.  No masses palpable.  Thyroid bed within normal limits to palpation.  Parotid glands and submandibular glands equal bilaterally without mass.  Trachea is midline. Neuro:  CN 2-12 grossly intact. Gait normal. Vestibular: No nystagmus at any point of gaze.   Procedure: Flexible Nasal Endoscopy: Description: Risks, benefits, and alternatives of flexible endoscopy were explained to the patient. Specific mention was made of the risk of throat numbness with difficulty swallowing, possible bleeding from the nose and mouth, and pain from the procedure. The patient gave oral consent to proceed.  The flexible scope was inserted into the right nasal cavity. Endoscopy of the interior nasal cavity, superior, inferior, and middle meatus was performed. The sphenoid-ethmoid recess was examined. Edematous mucosa was noted. No polyp, mass, or lesion was appreciated. Olfactory cleft was clear. Nasopharynx was clear. Turbinates were hypertrophied but without mass. Incomplete response to decongestion. The procedure was repeated on the contralateral side with severe nasal septal deviation and spur. The patient tolerated the procedure well.   Assessment 1. The patient is noted to have a severe left septal deviation and spur with bilateral inferior turbinate hypertrophy. No purulent drainage, polyps, or other suspicious mass or lesion is noted on today's nasal endoscopy.  2. Post nasal drip.   Plan  1. The  physical exam and nasal endoscopy findings are reviewed with the patient.  2. Treatment options include conservative management with daily steroid nasal spray versus septoplasty and bilateral inferior turbinate reduction. The risks, benefits, alternatives, and details of the procedures are reviewed with the patient. Questions are invited and answered. 3. The patient is interested in proceeding with the procedure.  We will schedule the procedure in accordance with the family schedule.  4. The patient should continue with his current allergy medication regimen in the interim.

## 2019-04-13 NOTE — Op Note (Signed)
DATE OF PROCEDURE: 04/13/2019  OPERATIVE REPORT   SURGEON: Leta Baptist, MD   PREOPERATIVE DIAGNOSES:  1. Severe nasal septal deviation.  2. Bilateral inferior turbinate hypertrophy.  3. Chronic nasal obstruction.  POSTOPERATIVE DIAGNOSES:  1. Severe nasal septal deviation.  2. Bilateral inferior turbinate hypertrophy.  3. Chronic nasal obstruction.  PROCEDURE PERFORMED:  1. Septoplasty.  2. Bilateral partial inferior turbinate resection.   ANESTHESIA: General endotracheal tube anesthesia.   COMPLICATIONS: None.   ESTIMATED BLOOD LOSS: 50 mL.   INDICATION FOR PROCEDURE: Patrick [name] is a 59 y.o. male with a history of chronic nasal obstruction. The patient was treated with antihistamine, decongestant, and steroid nasal sprays. However, the patient continued to be symptomatic. On examination, the patient was noted to have bilateral severe inferior turbinate hypertrophy and significant nasal septal deviation, causing significant nasal obstruction. Based on the above findings, the decision was made for the patient to undergo the above-stated procedures. The risks, benefits, alternatives, and details of the procedures were discussed with the patient. Questions were invited and answered. Informed consent was obtained.   DESCRIPTION OF PROCEDURE: The patient was taken to the operating room and placed supine on the operating table. General endotracheal tube anesthesia was administered by the anesthesiologist. The patient was positioned, and prepped and draped in the standard fashion for nasal surgery. Pledgets soaked with Afrin were placed in both nasal cavities for decongestion. The pledgets were subsequently removed.   Examination of the nasal cavity revealed a severe nasal septal deviation. 1% lidocaine with 1:100,000 epinephrine was injected onto the nasal septum bilaterally. A hemitransfixion incision was made on the left side. The mucosal flap was carefully elevated on the left side. A  cartilaginous incision was made 1 cm superior to the caudal margin of the nasal septum. Mucosal flap was also elevated on the right side in the similar fashion. It should be noted that due to the severe septal deviation, the deviated portion of the cartilaginous and bony septum had to be removed in piecemeal fashion. Once the deviated portions were removed, a straight midline septum was achieved. The septum was then quilted with 4-0 plain gut sutures. The hemitransfixion incision was closed with interrupted 4-0 chromic sutures.   The inferior one half of both hypertrophied inferior turbinate was crossclamped with a Kelly clamp. The inferior one half of each inferior turbinate was then resected with a pair of cross cutting scissors. Hemostasis was achieved with a suction cautery device.  Doyle splints were applied to the nasal septum.  The care of the patient was turned over to the anesthesiologist. The patient was awakened from anesthesia without difficulty. The patient was extubated and transferred to the recovery room in good condition.   OPERATIVE FINDINGS: Severe nasal septal deviation and bilateral inferior turbinate hypertrophy.   SPECIMEN: None.   FOLLOWUP CARE: The patient be discharged home once he is awake and alert. The patient will be placed on Percocet 1 tablets p.o. q.4 hours p.r.n. pain, and clindamycin for 3 days. The patient will follow up in my office in 3 days for splint removal.   Raylynne Cubbage Raynelle Bring, MD

## 2019-04-13 NOTE — Anesthesia Procedure Notes (Signed)
Procedure Name: Intubation Date/Time: 04/13/2019 9:07 AM Performed by: Lieutenant Diego, CRNA Pre-anesthesia Checklist: Patient identified, Emergency Drugs available, Suction available and Patient being monitored Patient Re-evaluated:Patient Re-evaluated prior to induction Oxygen Delivery Method: Circle system utilized Preoxygenation: Pre-oxygenation with 100% oxygen Induction Type: IV induction Ventilation: Mask ventilation without difficulty Laryngoscope Size: Miller, 2 and Glidescope Grade View: Grade II Tube type: Oral Tube size: 8.0 mm Number of attempts: 2 Airway Equipment and Method: Stylet,  Oral airway and Video-laryngoscopy Placement Confirmation: ETT inserted through vocal cords under direct vision,  positive ETCO2 and breath sounds checked- equal and bilateral Secured at: 22 cm Tube secured with: Tape Dental Injury: Teeth and Oropharynx as per pre-operative assessment

## 2019-04-13 NOTE — Discharge Instructions (Signed)
Post Anesthesia Home Care Instructions  Activity: Get plenty of rest for the remainder of the day. A responsible individual must stay with you for 24 hours following the procedure.  For the next 24 hours, DO NOT: -Drive a car -Operate machinery -Drink alcoholic beverages -Take any medication unless instructed by your physician -Make any legal decisions or sign important papers.  Meals: Start with liquid foods such as gelatin or soup. Progress to regular foods as tolerated. Avoid greasy, spicy, heavy foods. If nausea and/or vomiting occur, drink only clear liquids until the nausea and/or vomiting subsides. Call your physician if vomiting continues.  Special Instructions/Symptoms: Your throat may feel dry or sore from the anesthesia or the breathing tube placed in your throat during surgery. If this causes discomfort, gargle with warm salt water. The discomfort should disappear within 24 hours.  If you had a scopolamine patch placed behind your ear for the management of post- operative nausea and/or vomiting:  1. The medication in the patch is effective for 72 hours, after which it should be removed.  Wrap patch in a tissue and discard in the trash. Wash hands thoroughly with soap and water. 2. You may remove the patch earlier than 72 hours if you experience unpleasant side effects which may include dry mouth, dizziness or visual disturbances. 3. Avoid touching the patch. Wash your hands with soap and water after contact with the patch.     Call your surgeon if you experience:   1.  Fever over 101.0. 2.  Inability to urinate. 3.  Nausea and/or vomiting. 4.  Extreme swelling or bruising at the surgical site. 5.  Continued bleeding from the incision. 6.  Increased pain, redness or drainage from the incision. 7.  Problems related to your pain medication. 8.  Any problems and/or concerns ----------------  POSTOPERATIVE INSTRUCTIONS FOR PATIENTS HAVING NASAL OR SINUS  OPERATIONS ACTIVITY: Restrict activity at home for the first two days, resting as much as possible. Light activity is best. You may usually return to work within a week. You should refrain from nose blowing, strenuous activity, or heavy lifting greater than 20lbs for a total of one week after your operation.  If sneezing cannot be avoided, sneeze with your mouth open. DISCOMFORT: You may experience a dull headache and pressure along with nasal congestion and discharge. These symptoms may be worse during the first week after the operation but may last as long as two to four weeks.  Please take Tylenol or the pain medication that has been prescribed for you. Do not take aspirin or aspirin containing medications since they may cause bleeding.  You may experience symptoms of post nasal drainage, nasal congestion, headaches and fatigue for two or three months after your operation.  BLEEDING: You may have some blood tinged nasal drainage for approximately two weeks after the operation.  The discharge will be worse for the first week.  Please call our office at [phone] or go to the nearest hospital emergency room if you experience any of the following: heavy, bright red blood from your nose or mouth that lasts longer than 15 minutes or coughing up or vomiting bright red blood or blood clots. GENERAL CONSIDERATIONS: 1. A gauze dressing will be placed on your upper lip to absorb any drainage after the operation. You may need to change this several times a day.  If you do not have very much drainage, you may remove the dressing.  Remember that you may gently wipe your nose with a   tissue and sniff in, but DO NOT blow your nose. 2. Please keep all of your postoperative appointments.  Your final results after the operation will depend on proper follow-up.  The initial visit is usually 2 to 5 days after the operation.  During this visit, the remaining nasal packing and internal septal splints will be removed.  Your  nasal and sinus cavities will be cleaned.  During the second visit, your nasal and sinus cavities will be cleaned again. Have someone drive you to your first two postoperative appointments.  3. How you care for your nose after the operation will influence the results that you obtain.  You should follow all directions, take your medication as prescribed, and call our office [phone] with any problems or questions. 4. You may be more comfortable sleeping with your head elevated on two pillows. 5. Do not take any medications that we have not prescribed or recommended. WARNING SIGNS: if any of the following should occur, please call our office: 1. Persistent fever greater than 102F. 2. Persistent vomiting. 3. Severe and constant pain that is not relieved by prescribed pain medication. 4. Trauma to the nose. 5. Rash or unusual side effects from any medicines.  

## 2019-04-13 NOTE — Anesthesia Postprocedure Evaluation (Signed)
Anesthesia Post Note  Patient: Patrick [name]  Procedure(s) Performed: NASAL SEPTOPLASTY WITH BILATERAL TURBINATE REDUCTION (Bilateral Nose)     Patient location during evaluation: PACU Anesthesia Type: General Level of consciousness: awake and alert Pain management: pain level controlled Vital Signs Assessment: post-procedure vital signs reviewed and stable Respiratory status: spontaneous breathing, nonlabored ventilation and respiratory function stable Cardiovascular status: blood pressure returned to baseline and stable Postop Assessment: no apparent nausea or vomiting Anesthetic complications: no    Last Vitals:  Vitals:   04/13/19 1030 04/13/19 1100  BP: 127/61 (!) 145/76  Pulse: 99 99  Resp: 20 18  Temp:  36.8 C  SpO2: 100% 94%    Last Pain:  Vitals:   04/13/19 1100  TempSrc:   PainSc: 0-No pain                 Lidia Collum

## 2019-04-13 NOTE — Transfer of Care (Signed)
Immediate Anesthesia Transfer of Care Note  Patient: Patrick [name]  Procedure(s) Performed: NASAL SEPTOPLASTY WITH BILATERAL TURBINATE REDUCTION (Bilateral Nose)  Patient Location: PACU  Anesthesia Type:General  Level of Consciousness: awake  Airway & Oxygen Therapy: Patient Spontanous Breathing and Patient connected to face mask oxygen  Post-op Assessment: Report given to RN and Post -op Vital signs reviewed and stable  Post vital signs: Reviewed and stable  Last Vitals:  Vitals Value Taken Time  BP    Temp    Pulse 96 04/13/19 1019  Resp 21 04/13/19 1019  SpO2 100 % 04/13/19 1019  Vitals shown include unvalidated device data.  Last Pain:  Vitals:   04/13/19 0802  TempSrc: Oral  PainSc: 0-No pain      Patients Stated Pain Goal: 7 ([phone] [id number])  Complications: No apparent anesthesia complications

## 2019-04-14 ENCOUNTER — Encounter: Payer: Self-pay | Admitting: *Deleted

## 2019-04-14 NOTE — Addendum Note (Signed)
Addendum  created 04/14/19 1130 by Ashby Leflore, Ernesta Amble, CRNA   Charge Capture section accepted

## 2019-05-20 ENCOUNTER — Ambulatory Visit: Payer: 59 | Attending: Internal Medicine

## 2019-05-20 ENCOUNTER — Ambulatory Visit: Payer: 59 | Admitting: Family Medicine

## 2019-05-20 DIAGNOSIS — Z23 Encounter for immunization: Secondary | ICD-10-CM

## 2019-05-20 NOTE — Progress Notes (Signed)
   Covid-19 Vaccination Clinic  Name:  Patrick [name]    MRN: [id number] DOB: 08-30-1960  05/20/2019  [name] was observed post Covid-19 immunization for 15 minutes without incident. He was provided with Vaccine Information Sheet and instruction to access the V-Safe system.   [name] was instructed to call 911 with any severe reactions post vaccine: Marland Kitchen Difficulty breathing  . Swelling of face and throat  . A fast heartbeat  . A bad rash all over body  . Dizziness and weakness   Immunizations Administered    Name Date Dose VIS Date Route   Moderna COVID-19 Vaccine 05/20/2019  2:39 PM 0.5 mL 02/03/2019 Intramuscular   Manufacturer: Moderna   Lot: [id number]   [id number]

## 2019-06-03 ENCOUNTER — Other Ambulatory Visit: Payer: Self-pay | Admitting: Internal Medicine

## 2019-06-05 ENCOUNTER — Other Ambulatory Visit: Payer: Self-pay | Admitting: Internal Medicine

## 2019-06-05 NOTE — Telephone Encounter (Signed)
Please refill as per office routine med refill policy (all routine meds refilled for 3 mo or monthly per pt preference up to one year from last visit, then month to month grace period for 3 mo, then further med refills will have to be denied)  

## 2019-06-17 ENCOUNTER — Ambulatory Visit: Payer: 59 | Attending: Internal Medicine

## 2019-06-17 DIAGNOSIS — Z23 Encounter for immunization: Secondary | ICD-10-CM

## 2019-06-17 NOTE — Progress Notes (Signed)
   Covid-19 Vaccination Clinic  Name:  Patrick [name]    MRN: [id number] DOB: 08-13-1960  06/17/2019  [name] was observed post Covid-19 immunization for 15 minutes without incident. He was provided with Vaccine Information Sheet and instruction to access the V-Safe system.   [name] was instructed to call 911 with any severe reactions post vaccine: Marland Kitchen Difficulty breathing  . Swelling of face and throat  . A fast heartbeat  . A bad rash all over body  . Dizziness and weakness   Immunizations Administered    Name Date Dose VIS Date Route   Moderna COVID-19 Vaccine 06/17/2019 12:15 PM 0.5 mL 02/03/2019 Intramuscular   Manufacturer: Moderna   Lot: [id number]   [place]:[id number]

## 2019-07-01 ENCOUNTER — Ambulatory Visit: Payer: 59 | Attending: Internal Medicine

## 2019-07-01 DIAGNOSIS — Z20822 Contact with and (suspected) exposure to covid-19: Secondary | ICD-10-CM

## 2019-07-02 LAB — NOVEL CORONAVIRUS, NAA: SARS-CoV-2, NAA: NOT DETECTED

## 2019-07-02 LAB — SARS-COV-2, NAA 2 DAY TAT

## 2019-07-31 ENCOUNTER — Encounter: Payer: BLUE CROSS/BLUE SHIELD | Admitting: Internal Medicine

## 2019-08-07 ENCOUNTER — Other Ambulatory Visit: Payer: Self-pay

## 2019-08-07 ENCOUNTER — Telehealth: Payer: Self-pay | Admitting: Internal Medicine

## 2019-08-07 MED ORDER — ROSUVASTATIN CALCIUM 10 MG PO TABS: 10.0000 mg | ORAL_TABLET | Freq: Every day | ORAL | 3 refills | Status: DC

## 2019-08-07 NOTE — Telephone Encounter (Signed)
    1.Medication Requested:rosuvastatin (CRESTOR) 10 MG tablet  2. Pharmacy (Name, Street, City):[facility], [place]  3. On Med List: yes 4. Last Visit with PCP: 07/2018  5. Next visit date with PCP:08/14/2019   Agent: Please be advised that RX refills may take up to 3 business days. We ask that you follow-up with your pharmacy.

## 2019-08-12 ENCOUNTER — Other Ambulatory Visit: Payer: Self-pay

## 2019-08-12 ENCOUNTER — Ambulatory Visit: Payer: Self-pay

## 2019-08-12 ENCOUNTER — Other Ambulatory Visit (INDEPENDENT_AMBULATORY_CARE_PROVIDER_SITE_OTHER): Payer: 59

## 2019-08-12 ENCOUNTER — Ambulatory Visit (INDEPENDENT_AMBULATORY_CARE_PROVIDER_SITE_OTHER): Payer: 59

## 2019-08-12 ENCOUNTER — Ambulatory Visit (INDEPENDENT_AMBULATORY_CARE_PROVIDER_SITE_OTHER): Payer: 59 | Admitting: Family Medicine

## 2019-08-12 ENCOUNTER — Encounter: Payer: Self-pay | Admitting: Family Medicine

## 2019-08-12 VITALS — BP 118/78 | HR 72 | Ht 70.0 in | Wt 225.0 lb

## 2019-08-12 DIAGNOSIS — M2550 Pain in unspecified joint: Secondary | ICD-10-CM

## 2019-08-12 DIAGNOSIS — M7122 Synovial cyst of popliteal space [Baker], left knee: Secondary | ICD-10-CM | POA: Diagnosis not present

## 2019-08-12 DIAGNOSIS — Z0000 Encounter for general adult medical examination without abnormal findings: Secondary | ICD-10-CM | POA: Diagnosis not present

## 2019-08-12 DIAGNOSIS — M7551 Bursitis of right shoulder: Secondary | ICD-10-CM

## 2019-08-12 DIAGNOSIS — M25562 Pain in left knee: Secondary | ICD-10-CM

## 2019-08-12 DIAGNOSIS — R739 Hyperglycemia, unspecified: Secondary | ICD-10-CM

## 2019-08-12 LAB — TSH: TSH: 1.59 u[IU]/mL (ref 0.35–4.50)

## 2019-08-12 LAB — HEPATIC FUNCTION PANEL
ALT: 22 U/L (ref 0–53)
AST: 22 U/L (ref 0–37)
Albumin: 4.3 g/dL (ref 3.5–5.2)
Alkaline Phosphatase: 59 U/L (ref 39–117)
Bilirubin, Direct: 0.1 mg/dL (ref 0.0–0.3)
Total Bilirubin: 0.5 mg/dL (ref 0.2–1.2)
Total Protein: 6.7 g/dL (ref 6.0–8.3)

## 2019-08-12 LAB — CBC WITH DIFFERENTIAL/PLATELET
Basophils Absolute: 0.1 10*3/uL (ref 0.0–0.1)
Basophils Relative: 1.1 % (ref 0.0–3.0)
Eosinophils Absolute: 0.3 10*3/uL (ref 0.0–0.7)
Eosinophils Relative: 4.7 % (ref 0.0–5.0)
HCT: 42.9 % (ref 39.0–52.0)
Hemoglobin: 14.5 g/dL (ref 13.0–17.0)
Lymphocytes Relative: 24.8 % (ref 12.0–46.0)
Lymphs Abs: 1.5 10*3/uL (ref 0.7–4.0)
MCHC: 33.8 g/dL (ref 30.0–36.0)
MCV: 87.1 fl (ref 78.0–100.0)
Monocytes Absolute: 0.8 10*3/uL (ref 0.1–1.0)
Monocytes Relative: 12.6 % — ABNORMAL HIGH (ref 3.0–12.0)
Neutro Abs: 3.5 10*3/uL (ref 1.4–7.7)
Neutrophils Relative %: 56.8 % (ref 43.0–77.0)
Platelets: 203 10*3/uL (ref 150.0–400.0)
RBC: 4.93 Mil/uL (ref 4.22–5.81)
RDW: 13.9 % (ref 11.5–15.5)
WBC: 6.1 10*3/uL (ref 4.0–10.5)

## 2019-08-12 LAB — LIPID PANEL
Cholesterol: 114 mg/dL (ref 0–200)
HDL: 47 mg/dL (ref >39.00)
LDL Cholesterol: 52 mg/dL (ref 0–99)
NonHDL: 66.67
Total CHOL/HDL Ratio: 2
Triglycerides: 75 mg/dL (ref 0.0–149.0)
VLDL: 15 mg/dL (ref 0.0–40.0)

## 2019-08-12 LAB — URINALYSIS, ROUTINE W REFLEX MICROSCOPIC
Bilirubin Urine: NEGATIVE
Hgb urine dipstick: NEGATIVE
Ketones, ur: NEGATIVE
Leukocytes,Ua: NEGATIVE
Nitrite: NEGATIVE
RBC / HPF: NONE SEEN (ref 0–?)
Specific Gravity, Urine: >=1.03 — AB (ref 1.000–1.030)
Total Protein, Urine: NEGATIVE
Urine Glucose: NEGATIVE
Urobilinogen, UA: 0.2 (ref 0.0–1.0)
pH: 6 (ref 5.0–8.0)

## 2019-08-12 LAB — BASIC METABOLIC PANEL
BUN: 23 mg/dL (ref 6–23)
CO2: 27 mEq/L (ref 19–32)
Calcium: 9 mg/dL (ref 8.4–10.5)
Chloride: 104 mEq/L (ref 96–112)
Creatinine, Ser: 0.84 mg/dL (ref 0.40–1.50)
GFR: 93.72 mL/min (ref >60.00)
Glucose, Bld: 98 mg/dL (ref 70–99)
Potassium: 4.2 mEq/L (ref 3.5–5.1)
Sodium: 137 mEq/L (ref 135–145)

## 2019-08-12 LAB — HEMOGLOBIN A1C: Hgb A1c MFr Bld: 5.6 % (ref 4.6–6.5)

## 2019-08-12 LAB — PSA: PSA: 0.39 ng/mL (ref 0.10–4.00)

## 2019-08-12 MED ORDER — MELOXICAM 15 MG PO TABS: 15.0000 mg | ORAL_TABLET | Freq: Every day | ORAL | 0 refills | Status: DC

## 2019-08-12 NOTE — Progress Notes (Signed)
[facility] [place] Phone: [phone] Subjective:   Patrick [name], am serving as a scribe for Dr. Hulan Saas. This visit occurred during the SARS-CoV-2 public health emergency.  Safety protocols were in place, including screening questions prior to the visit, additional usage of staff PPE, and extensive cleaning of exam room while observing appropriate contact time as indicated for disinfecting solutions.   I'm seeing this patient by the request  of:  Biagio Borg, MD  CC: Shoulder pain follow-up, left knee pain  ZWC:HENIDPOEUM 04/08/2019 Patient on ultrasound today did not show any significant inflammation at the moment.  I do see some mild inflammation of the acromioclavicular joint we discussed the possibility of an injection.  The patient has decided not to do that at the moment.  We discussed increasing activity as tolerated.  Patient will follow-up again in 6 to 8 weeks.  Worsening pain and will consider injections or advanced imaging.    Update 08/12/2019 Patrick [name] is a 59 y.o. male coming in with complaint of right shoulder pain. Patient states that he is not improving. Flexion increases pain in anterior aspect of shoulder.  Patient's last injection was in November.  Patient states that it did not make any significant improvement and continues to have pain on a daily activity.  Also having left knee pain in both standing and sitting. He describes pain throughout the joint. Pain began one month ago. Feels like pain causes him to limp at times. Uses ice at night for pain. Also uses compression sleeve which has not provided any relief.      Past Medical History:  Diagnosis Date  . Allergy   . Heart murmur   . History of kidney stones   . Hypertension    Past Surgical History:  Procedure Laterality Date  . APPENDECTOMY    . Arthroscopic knee surgeries on both knees    . CHOLECYSTECTOMY    . NASAL SEPTOPLASTY W/  TURBINOPLASTY Bilateral 04/13/2019   Procedure: NASAL SEPTOPLASTY WITH BILATERAL TURBINATE REDUCTION;  Surgeon: Leta Baptist, MD;  Location: [facility];  Service: ENT;  Laterality: Bilateral;  . VASECTOMY     Social History   Socioeconomic History  . Marital status: Married    Spouse name: Not on file  . Number of children: Not on file  . Years of education: Not on file  . Highest education level: Not on file  Occupational History  . Occupation: Print production planner, Financial controller, Semi-retired  Tobacco Use  . Smoking status: Former Smoker    Packs/day: 0.00    Types: Cigars  . Smokeless tobacco: Never Used  Substance and Sexual Activity  . Alcohol use: Yes    Alcohol/week: 5.0 standard drinks    Types: 5 Standard drinks or equivalent per week  . Drug use: [name]  . Sexual activity: Yes  Other Topics Concern  . Not on file  Social History Narrative   Married.   Education: [employer]   Exercise: Yes   Social Determinants of Health   Financial Resource Strain:   . Difficulty of Paying Living Expenses:   Food Insecurity:   . Worried About Charity fundraiser in the Last Year:   . Arboriculturist in the Last Year:   Transportation Needs:   . Film/video editor (Medical):   Marland Kitchen Lack of Transportation (Non-Medical):   Physical Activity:   . Days of Exercise per Week:   .  Minutes of Exercise per Session:   Stress:   . Feeling of Stress :   Social Connections:   . Frequency of Communication with Friends and Family:   . Frequency of Social Gatherings with Friends and Family:   . Attends Religious Services:   . Active Member of Clubs or Organizations:   . Attends Archivist Meetings:   Marland Kitchen Marital Status:    Allergies  Allergen Reactions  . Penicillins Hives    Rash around neck  Has patient had a PCN reaction causing immediate rash, facial/tongue/throat swelling, SOB or lightheadedness with hypotension: Yes Has patient had a PCN reaction causing severe rash involving  mucus membranes or skin necrosis: [name] Has patient had a PCN reaction that required hospitalization: [name] Has patient had a PCN reaction occurring within the last 10 years: [name] If all of the above answers are "[name]", then may proceed with Cephalosporin use.   Family History  Adopted: Yes     Current Outpatient Medications (Cardiovascular):  .  rosuvastatin (CRESTOR) 10 MG tablet, Take 1 tablet (10 mg total) by mouth daily. Marland Kitchen  telmisartan (MICARDIS) 80 MG tablet, Take 1 tablet (80 mg total) by mouth daily. Annual appt due in May must see provider for future refills  Current Outpatient Medications (Respiratory):  .  cetirizine (ZYRTEC) 10 MG tablet, Take 10 mg by mouth at bedtime.     Current Outpatient Medications (Other):  Marland Kitchen  Cholecalciferol (VITAMIN D) 50 MCG (2000 UT) CAPS,  .  Ginger, Zingiber officinalis, (GINGER ROOT) 550 MG CAPS,  .  Krill Oil 300 MG CAPS, Take 300 mg by mouth at bedtime.  .  Misc Natural Products (TART CHERRY ADVANCED) CAPS,  .  Multiple Vitamin (MULTIVITAMIN WITH MINERALS) TABS tablet, Take 1 tablet by mouth daily. Men's Over 52 .  Turmeric Curcumin 500 MG CAPS, Take 500 mg by mouth daily.   Reviewed prior external information including notes and imaging from  primary care provider As well as notes that were available from care everywhere and other healthcare systems.  Past medical history, social, surgical and family history all reviewed in electronic medical record.  [name] pertanent information unless stated regarding to the chief complaint.   Review of Systems:  [name] headache, visual changes, nausea, vomiting, diarrhea, constipation, dizziness, abdominal pain, skin rash, fevers, chills, night sweats, weight loss, swollen lymph nodes, body aches, joint swelling, chest pain, shortness of breath, mood changes. POSITIVE muscle aches  Objective  There were [name] vitals taken for this visit.   General: [name] apparent distress alert and oriented x3 mood and affect normal,  dressed appropriately.  HEENT: Pupils equal, extraocular movements intact  Respiratory: Patient's speak in full sentences and does not appear short of breath  Cardiovascular: [name] lower extremity edema, non tender, [name] erythema  Neuro: Cranial nerves II through XII are intact, neurovascularly intact in all extremities with 2+ DTRs and 2+ pulses.  Gait normal with good balance and coordination.  MSK: Right shoulder exam shows the patient continues to have some impingement with Neer and Hawkins.  Rotator cuff strength though appears to be intact.  Some mild increase in tenderness that is consistent with O'Brien's test noted.  Knee: Bilateral valgus deformity noted.  Abnormal thigh to calf ratio.  Tender to palpation over medial and PF joint line.  Significant decreased range of motion on the left knee though and does have fullness in the popliteal area instability with valgus force.  painful patellar compression. Patellar glide with moderate crepitus.  Patellar and quadriceps tendons unremarkable. Hamstring and quadriceps strength is normal.   Procedure: Real-time Ultrasound Guided Injection of right glenohumeral joint Device: GE Logiq Q7  Ultrasound guided injection is preferred based studies that show increased duration, increased effect, greater accuracy, decreased procedural pain, increased response rate with ultrasound guided versus blind injection.  Verbal informed consent obtained.  Time-out conducted.  Noted [name] overlying erythema, induration, or other signs of local infection.  Skin prepped in a sterile fashion.  Local anesthesia: Topical Ethyl chloride.  With sterile technique and under real time ultrasound guidance:  Joint visualized.  23g 1  inch needle inserted anterior approach. Pictures taken for needle placement. Patient did have injection of 2 cc of 1% lidocaine, 2 cc of 0.5% Marcaine, and 1.0 cc of Kenalog 40 mg/dL. Completed without difficulty  Pain immediately resolved  suggesting accurate placement of the medication.  Advised to call if fevers/chills, erythema, induration, drainage, or persistent bleeding.  Images permanently stored and available for review in the ultrasound unit.  Impression: Technically successful ultrasound guided injection.  Procedure: Real-time Ultrasound Guided Injection of left Baker's cyst Device: GE Logiq Q7 Ultrasound guided injection is preferred based studies that show increased duration, increased effect, greater accuracy, decreased procedural pain, increased response rate, and decreased cost with ultrasound guided versus blind injection.  Verbal informed consent obtained.  Time-out conducted.  Noted [name] overlying erythema, induration, or other signs of local infection.  Skin prepped in a sterile fashion.  Local anesthesia: Topical Ethyl chloride.  With sterile technique and under real time ultrasound guidance: With an 18-gauge 1-1/2 inch needle patient was injected with 1 cc of 0.5% Marcaine and then aspirated 20 cc of yellow colored fluid then injected 1 cc of Kenalog 40 mg/mL Completed without difficulty  Pain immediately resolved suggesting accurate placement of the medication.  Advised to call if fevers/chills, erythema, induration, drainage, or persistent bleeding.  Images permanently stored and available for review in the ultrasound unit.  Impression: Technically successful ultrasound guided injection.   Impression and Recommendations:     The above documentation has been reviewed and is accurate and complete Patrick [name]       Note: This dictation was prepared with Dragon dictation along with smaller phrase technology. Any transcriptional errors that result from this process are unintentional.

## 2019-08-12 NOTE — Assessment & Plan Note (Signed)
Continued calcific changes of the shoulder noted.  We are attempting 1 more injection at this moment.  Discussed icing regimen and home exercises.  If no significant improvement I would encourage patient to get an MR arthrogram of the shoulder which patient is in agreement.  Will call in 2 weeks if necessary

## 2019-08-12 NOTE — Patient Instructions (Addendum)
Xray today Meloxicam  Knee brace with activity  Send message in 2 weeks if not better will order MRI See me in 8 weeks

## 2019-08-13 ENCOUNTER — Encounter: Payer: Self-pay | Admitting: Family Medicine

## 2019-08-13 LAB — SYNOVIAL CELL COUNT + DIFF, W/ CRYSTALS
Basophils, %: 0 %
Eosinophils-Synovial: 2 % (ref 0–2)
Lymphocytes-Synovial Fld: 56 % (ref 0–74)
Monocyte/Macrophage: 17 % (ref 0–69)
Neutrophil, Synovial: 22 % (ref 0–24)
Synoviocytes, %: 3 % (ref 0–15)
WBC, Synovial: 422 cells/uL — ABNORMAL HIGH (ref <150)

## 2019-08-13 LAB — TIQ-NTM

## 2019-08-14 ENCOUNTER — Other Ambulatory Visit: Payer: Self-pay

## 2019-08-14 ENCOUNTER — Encounter: Payer: Self-pay | Admitting: Internal Medicine

## 2019-08-14 ENCOUNTER — Ambulatory Visit (INDEPENDENT_AMBULATORY_CARE_PROVIDER_SITE_OTHER): Payer: 59 | Admitting: Internal Medicine

## 2019-08-14 DIAGNOSIS — Z0000 Encounter for general adult medical examination without abnormal findings: Secondary | ICD-10-CM | POA: Diagnosis not present

## 2019-08-14 DIAGNOSIS — R739 Hyperglycemia, unspecified: Secondary | ICD-10-CM

## 2019-08-14 DIAGNOSIS — I10 Essential (primary) hypertension: Secondary | ICD-10-CM

## 2019-08-14 NOTE — Progress Notes (Signed)
Subjective:    Patient ID: Patrick [name], male    DOB: 1960/05/30, 59 y.o.   MRN: [id number]  HPI  Here for wellness and f/u;  Overall doing ok;  Pt denies Chest pain, worsening SOB, DOE, wheezing, orthopnea, PND, worsening LE edema, palpitations, dizziness or syncope.  Pt denies neurological change such as new headache, facial or extremity weakness.  Pt denies polydipsia, polyuria, or low sugar symptoms. Pt states overall good compliance with treatment and medications, good tolerability, and has been trying to follow appropriate diet.  Pt denies worsening depressive symptoms, suicidal ideation or panic. No fever, night sweats, wt loss, loss of appetite, or other constitutional symptoms.  Pt states good ability with ADL's, has low fall risk, home safety reviewed and adequate, no other significant changes in hearing or vision, and only occasionally active with exercise  Lost wt with better diet.   Wt Readings from Last 3 Encounters:  08/14/19 225 lb (102.1 kg)  08/12/19 225 lb (102.1 kg)  04/13/19 247 lb 12.8 oz (112.4 kg)   BP Readings from Last 3 Encounters:  08/14/19 132/78  08/12/19 118/78  04/13/19 (!) 145/76   Past Medical History:  Diagnosis Date  . Allergy   . Heart murmur   . History of kidney stones   . Hypertension    Past Surgical History:  Procedure Laterality Date  . APPENDECTOMY    . Arthroscopic knee surgeries on both knees    . CHOLECYSTECTOMY    . NASAL SEPTOPLASTY W/ TURBINOPLASTY Bilateral 04/13/2019   Procedure: NASAL SEPTOPLASTY WITH BILATERAL TURBINATE REDUCTION;  Surgeon: Leta Baptist, MD;  Location: [facility];  Service: ENT;  Laterality: Bilateral;  . VASECTOMY      reports that he has quit smoking. His smoking use included cigars. He smoked 0.00 packs per day. He has never used smokeless tobacco. He reports current alcohol use of about 5.0 standard drinks of alcohol per week. He reports that he does not use drugs. family history is not on file. He  was adopted. Allergies  Allergen Reactions  . Penicillins Hives    Rash around neck  Has patient had a PCN reaction causing immediate rash, facial/tongue/throat swelling, SOB or lightheadedness with hypotension: Yes Has patient had a PCN reaction causing severe rash involving mucus membranes or skin necrosis: No Has patient had a PCN reaction that required hospitalization: No Has patient had a PCN reaction occurring within the last 10 years: No If all of the above answers are "NO", then may proceed with Cephalosporin use.   Current Outpatient Medications on File Prior to Visit  Medication Sig Dispense Refill  . aspirin (ASPIR-LOW) 81 MG EC tablet     . cetirizine (ZYRTEC) 10 MG tablet Take 10 mg by mouth at bedtime.     . Cholecalciferol (VITAMIN D) 50 MCG (2000 UT) CAPS     . Ginger, Zingiber officinalis, (GINGER ROOT) 550 MG CAPS     . Krill Oil 300 MG CAPS Take 300 mg by mouth at bedtime.     . meloxicam (MOBIC) 15 MG tablet Take 1 tablet (15 mg total) by mouth daily. 30 tablet 0  . Misc Natural Products (TART CHERRY ADVANCED) CAPS     . Multiple Vitamin (MULTIVITAMIN WITH MINERALS) TABS tablet Take 1 tablet by mouth daily. Men's Over 60    . rosuvastatin (CRESTOR) 10 MG tablet Take 1 tablet (10 mg total) by mouth daily. 90 tablet 3  . telmisartan (MICARDIS) 80 MG tablet Take  1 tablet (80 mg total) by mouth daily. Annual appt due in May must see provider for future refills 30 tablet 0  . Turmeric Curcumin 500 MG CAPS Take 500 mg by mouth daily.     No current facility-administered medications on file prior to visit.   Review of Systems All otherwise neg per pt     Objective:   Physical Exam BP 132/78 (BP Location: Left Arm, Patient Position: Sitting, Cuff Size: Large)   Pulse 91   Temp 98.2 F (36.8 C) (Oral)   Ht 5\' 10"  (1.778 m)   Wt 225 lb (102.1 kg)   SpO2 95%   BMI 32.28 kg/m  VS noted,  Constitutional: Pt appears in NAD HENT: Head: NCAT.  Right Ear: External ear  normal.  Left Ear: External ear normal.  Eyes: . Pupils are equal, round, and reactive to light. Conjunctivae and EOM are normal Nose: without d/c or deformity Neck: Neck supple. Gross normal ROM Cardiovascular: Normal rate and regular rhythm.   Pulmonary/Chest: Effort normal and breath sounds without rales or wheezing.  Abd:  Soft, NT, ND, + BS, no organomegaly Neurological: Pt is alert. At baseline orientation, motor grossly intact Skin: Skin is warm. No rashes, other new lesions, no LE edema Psychiatric: Pt behavior is normal without agitation  All otherwise neg per pt Lab Results  Component Value Date   WBC 6.1 08/12/2019   HGB 14.5 08/12/2019   HCT 42.9 08/12/2019   PLT 203.0 08/12/2019   GLUCOSE 98 08/12/2019   CHOL 114 08/12/2019   TRIG 75.0 08/12/2019   HDL 47.00 08/12/2019   LDLCALC 52 08/12/2019   ALT 22 08/12/2019   AST 22 08/12/2019   NA 137 08/12/2019   K 4.2 08/12/2019   CL 104 08/12/2019   CREATININE 0.84 08/12/2019   BUN 23 08/12/2019   CO2 27 08/12/2019   TSH 1.59 08/12/2019   PSA 0.39 08/12/2019   HGBA1C 5.6 08/12/2019      Assessment & Plan:

## 2019-08-14 NOTE — Patient Instructions (Signed)

## 2019-08-16 ENCOUNTER — Encounter: Payer: Self-pay | Admitting: Internal Medicine

## 2019-08-16 NOTE — Assessment & Plan Note (Signed)

## 2019-08-16 NOTE — Assessment & Plan Note (Signed)
stable overall by history and exam, recent data reviewed with pt, and pt to continue medical treatment as before,  to f/u any worsening symptoms or concerns  

## 2019-09-01 ENCOUNTER — Ambulatory Visit (INDEPENDENT_AMBULATORY_CARE_PROVIDER_SITE_OTHER): Payer: 59 | Admitting: Family Medicine

## 2019-09-01 ENCOUNTER — Encounter: Payer: Self-pay | Admitting: Family Medicine

## 2019-09-01 ENCOUNTER — Other Ambulatory Visit: Payer: Self-pay

## 2019-09-01 VITALS — BP 140/80 | HR 102 | Ht 70.0 in | Wt 221.0 lb

## 2019-09-01 DIAGNOSIS — M25562 Pain in left knee: Secondary | ICD-10-CM

## 2019-09-01 DIAGNOSIS — M7122 Synovial cyst of popliteal space [Baker], left knee: Secondary | ICD-10-CM | POA: Diagnosis not present

## 2019-09-01 DIAGNOSIS — M2352 Chronic instability of knee, left knee: Secondary | ICD-10-CM | POA: Diagnosis not present

## 2019-09-01 DIAGNOSIS — G8929 Other chronic pain: Secondary | ICD-10-CM | POA: Diagnosis not present

## 2019-09-01 MED ORDER — NABUMETONE 750 MG PO TABS: 750.0000 mg | ORAL_TABLET | Freq: Every day | ORAL | 1 refills | Status: DC

## 2019-09-01 NOTE — Patient Instructions (Signed)
Good to see you MRI ordered Next steps after MRI I will write you

## 2019-09-01 NOTE — Assessment & Plan Note (Signed)
Patient has had a large Baker's cyst and we did try aspiration.  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  Worsening pain now.  Patient's x-rays have shown some mild arthritic changes but nothing severe I do believe with the increasing instability that an MRI is necessary at this time.  Patient has failed all other conservative therapy.  We discussed other potential types of injections but do not feel that it would be necessary in this individual at the moment.  Follow-up again in 4 to 6 weeks.

## 2019-09-01 NOTE — Progress Notes (Signed)
[place] [address] [place] Phone: [phone] Subjective:   I Patrick [name] am serving as a Education administrator for Dr. Hulan Saas.  This visit occurred during the SARS-CoV-2 public health emergency.  Safety protocols were in place, including screening questions prior to the visit, additional usage of staff PPE, and extensive cleaning of exam room while observing appropriate contact time as indicated for disinfecting solutions.   I'm seeing this patient by the request  of:  Biagio Borg, MD  CC: Left knee pain  [id number]   08/12/2019 Continued calcific changes of the shoulder noted.  We are attempting 1 more injection at this moment.  Discussed icing regimen and home exercises.  If no significant improvement I would encourage patient to get an MR arthrogram of the shoulder which patient is in agreement.  Will call in 2 weeks if necessary  Update 09/01/2019 Patrick [name] is a 59 y.o. male coming in with complaint of left knee and right shoulder pain. Patient states the left knee is his pain concern. Left knee is not doing well. Cyst drained last visit helped with flexibility. Medial knee pain never went away. Medial side is sore with sitting, standing, stairs and walking. States the knee is unstable.  Wearing compression sleeve. Tuesday last week was when the pain started. Ice, advil and meloxicam doesn't work. Pain wakes him up at night.        Past Medical History:  Diagnosis Date  . Allergy   . Heart murmur   . History of kidney stones   . Hypertension    Past Surgical History:  Procedure Laterality Date  . APPENDECTOMY    . Arthroscopic knee surgeries on both knees    . CHOLECYSTECTOMY    . NASAL SEPTOPLASTY W/ TURBINOPLASTY Bilateral 04/13/2019   Procedure: NASAL SEPTOPLASTY WITH BILATERAL TURBINATE REDUCTION;  Surgeon: Leta Baptist, MD;  Location: [facility];  Service: ENT;  Laterality: Bilateral;  . VASECTOMY      Social History   Socioeconomic History  . Marital status: Married    Spouse name: Not on file  . Number of children: Not on file  . Years of education: Not on file  . Highest education level: Not on file  Occupational History  . Occupation: Print production planner, Financial controller, Semi-retired  Tobacco Use  . Smoking status: Former Smoker    Packs/day: 0.00    Types: Cigars  . Smokeless tobacco: Never Used  Vaping Use  . Vaping Use: Never used  Substance and Sexual Activity  . Alcohol use: Yes    Alcohol/week: 5.0 standard drinks    Types: 5 Standard drinks or equivalent per week  . Drug use: No  . Sexual activity: Yes  Other Topics Concern  . Not on file  Social History Narrative   Married.   Education: [employer]   Exercise: Yes   Social Determinants of Health   Financial Resource Strain:   . Difficulty of Paying Living Expenses:   Food Insecurity:   . Worried About Charity fundraiser in the Last Year:   . Arboriculturist in the Last Year:   Transportation Needs:   . Film/video editor (Medical):   Marland Kitchen Lack of Transportation (Non-Medical):   Physical Activity:   . Days of Exercise per Week:   . Minutes of Exercise per Session:   Stress:   . Feeling of Stress :   Social Connections:   . Frequency of Communication  with Friends and Family:   . Frequency of Social Gatherings with Friends and Family:   . Attends Religious Services:   . Active Member of Clubs or Organizations:   . Attends Archivist Meetings:   Marland Kitchen Marital Status:    Allergies  Allergen Reactions  . Penicillins Hives    Rash around neck  Has patient had a PCN reaction causing immediate rash, facial/tongue/throat swelling, SOB or lightheadedness with hypotension: Yes Has patient had a PCN reaction causing severe rash involving mucus membranes or skin necrosis: No Has patient had a PCN reaction that required hospitalization: No Has patient had a PCN reaction occurring within the last 10 years: No If all  of the above answers are "NO", then may proceed with Cephalosporin use.   Family History  Adopted: Yes     Current Outpatient Medications (Cardiovascular):  .  rosuvastatin (CRESTOR) 10 MG tablet, Take 1 tablet (10 mg total) by mouth daily. Marland Kitchen  telmisartan (MICARDIS) 80 MG tablet, Take 1 tablet (80 mg total) by mouth daily. Annual appt due in May must see provider for future refills  Current Outpatient Medications (Respiratory):  .  cetirizine (ZYRTEC) 10 MG tablet, Take 10 mg by mouth at bedtime.   Current Outpatient Medications (Analgesics):  .  aspirin (ASPIR-LOW) 81 MG EC tablet,  .  meloxicam (MOBIC) 15 MG tablet, Take 1 tablet (15 mg total) by mouth daily. .  nabumetone (RELAFEN) 750 MG tablet, Take 1 tablet (750 mg total) by mouth daily.   Current Outpatient Medications (Other):  Marland Kitchen  Cholecalciferol (VITAMIN D) 50 MCG (2000 UT) CAPS,  .  Ginger, Zingiber officinalis, (GINGER ROOT) 550 MG CAPS,  .  Krill Oil 300 MG CAPS, Take 300 mg by mouth at bedtime.  .  Misc Natural Products (TART CHERRY ADVANCED) CAPS,  .  Multiple Vitamin (MULTIVITAMIN WITH MINERALS) TABS tablet, Take 1 tablet by mouth daily. Men's Over 65  .  Turmeric Curcumin 500 MG CAPS, Take 500 mg by mouth daily.    Reviewed prior external information including notes and imaging from  primary care provider As well as notes that were available from care everywhere and other healthcare systems.  Past medical history, social, surgical and family history all reviewed in electronic medical record.  No pertanent information unless stated regarding to the chief complaint.   Review of Systems:  No headache, visual changes, nausea, vomiting, diarrhea, constipation, dizziness, abdominal pain, skin rash, fevers, chills, night sweats, weight loss, swollen lymph nodes, body aches, joint swelling, chest pain, shortness of breath, mood changes. POSITIVE muscle aches  Objective  Blood pressure 140/80, pulse (!) 102, height 5'  10" (1.778 m), weight 221 lb (100.2 kg), SpO2 98 %.   General: No apparent distress alert and oriented x3 mood and affect normal, dressed appropriately.  HEENT: Pupils equal, extraocular movements intact  Respiratory: Patient's speak in full sentences and does not appear short of breath  Cardiovascular: No lower extremity edema, non tender, no erythema  Neuro: Cranial nerves II through XII are intact, neurovascularly intact in all extremities with 2+ DTRs and 2+ pulses.  Gait normal with good balance and coordination.  MSK: Left knee has significant more discomfort over the medial joint line than previously.  Patient feels like he he does have instability of the knee.  Varus and valgus force has some mild laxity but does seem to have endpoints.  Patient does have laxity of the ACL noted a little bit as well.  Patient previous  Baker's cyst or is not palpable at the moment.    Impression and Recommendations:     The above documentation has been reviewed and is accurate and complete Lyndal Pulley, DO       Note: This dictation was prepared with Dragon dictation along with smaller phrase technology. Any transcriptional errors that result from this process are unintentional.

## 2019-09-09 ENCOUNTER — Other Ambulatory Visit: Payer: Self-pay | Admitting: Family Medicine

## 2019-09-17 ENCOUNTER — Ambulatory Visit
Admission: RE | Admit: 2019-09-17 | Discharge: 2019-09-17 | Disposition: A | Discharge status: Home / Self Care | Payer: 59 | Source: Ambulatory Visit | Attending: Family Medicine | Admitting: Family Medicine

## 2019-09-17 ENCOUNTER — Encounter: Payer: Self-pay | Admitting: Family Medicine

## 2019-09-17 ENCOUNTER — Other Ambulatory Visit: Payer: Self-pay

## 2019-09-17 DIAGNOSIS — G8929 Other chronic pain: Secondary | ICD-10-CM

## 2019-09-21 ENCOUNTER — Other Ambulatory Visit: Payer: Self-pay

## 2019-09-21 DIAGNOSIS — G8929 Other chronic pain: Secondary | ICD-10-CM

## 2019-09-21 DIAGNOSIS — M25562 Pain in left knee: Secondary | ICD-10-CM

## 2019-09-22 ENCOUNTER — Ambulatory Visit (INDEPENDENT_AMBULATORY_CARE_PROVIDER_SITE_OTHER): Payer: 59 | Admitting: Family Medicine

## 2019-09-22 ENCOUNTER — Ambulatory Visit: Payer: Self-pay

## 2019-09-22 ENCOUNTER — Other Ambulatory Visit: Payer: Self-pay

## 2019-09-22 ENCOUNTER — Encounter: Payer: Self-pay | Admitting: Family Medicine

## 2019-09-22 VITALS — BP 120/90 | HR 94 | Ht 70.0 in | Wt 221.0 lb

## 2019-09-22 DIAGNOSIS — G8929 Other chronic pain: Secondary | ICD-10-CM

## 2019-09-22 DIAGNOSIS — M25562 Pain in left knee: Secondary | ICD-10-CM | POA: Diagnosis not present

## 2019-09-22 DIAGNOSIS — M7122 Synovial cyst of popliteal space [Baker], left knee: Secondary | ICD-10-CM

## 2019-09-22 DIAGNOSIS — M2352 Chronic instability of knee, left knee: Secondary | ICD-10-CM | POA: Diagnosis not present

## 2019-09-22 NOTE — Patient Instructions (Addendum)
Good to see you Keep your appointment Contact ortho.

## 2019-09-22 NOTE — Assessment & Plan Note (Addendum)
Chronic problem with continued exacerbation aspiration of Baker's cyst.  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  Has been referred to orthopedics to discuss the possibility of surgical intervention versus replacement.  Patient is to increase activity slowly.  Follow-up as needed.  Discussed medications including the naproxen.

## 2019-09-22 NOTE — Progress Notes (Signed)
[place] Phone: [phone] Subjective:    I'm seeing this patient by the request  of:  Biagio Borg, MD  CC: Knee pain follow-up  [id number]   09/01/2019 Patient has had a large Baker's cyst and we did try aspiration.  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  Worsening pain now.  Patient's x-rays have shown some mild arthritic changes but nothing severe I do believe with the increasing instability that an MRI is necessary at this time.  Patient has failed all other conservative therapy.  We discussed other potential types of injections but do not feel that it would be necessary in this individual at the moment.  Follow-up again in 4 to 6 weeks.  09/22/2019 Verdis Koval is a 59 y.o. male coming in with complaint of left knee pain. States he is still in pain. Would like cyst drained today.      Patient's MRI of the knee was independently visualized by me.  Patient did have what appears to be of fairly large tear of the posterior horn of the medial meniscus with displacement and potentially even detachment.  Lateral meniscus also has a posterior horn tear.  Advanced tricompartmental degenerative changes and a stress fracture noted along the medial tibial plateau degenerative ACL and moderate large Baker's cyst  Past Medical History:  Diagnosis Date  . Allergy   . Heart murmur   . History of kidney stones   . Hypertension    Past Surgical History:  Procedure Laterality Date  . APPENDECTOMY    . Arthroscopic knee surgeries on both knees    . CHOLECYSTECTOMY    . NASAL SEPTOPLASTY W/ TURBINOPLASTY Bilateral 04/13/2019   Procedure: NASAL SEPTOPLASTY WITH BILATERAL TURBINATE REDUCTION;  Surgeon: Leta Baptist, MD;  Location: [facility];  Service: ENT;  Laterality: Bilateral;  . VASECTOMY     Social History   Socioeconomic History  . Marital status: Married    Spouse name: Not on file  . Number of children:  Not on file  . Years of education: Not on file  . Highest education level: Not on file  Occupational History  . Occupation: Print production planner, Financial controller, Semi-retired  Tobacco Use  . Smoking status: Former Smoker    Packs/day: 0.00    Types: Cigars  . Smokeless tobacco: Never Used  Vaping Use  . Vaping Use: Never used  Substance and Sexual Activity  . Alcohol use: Yes    Alcohol/week: 5.0 standard drinks    Types: 5 Standard drinks or equivalent per week  . Drug use: No  . Sexual activity: Yes  Other Topics Concern  . Not on file  Social History Narrative   Married.   Education: [employer]   Exercise: Yes   Social Determinants of Health   Financial Resource Strain:   . Difficulty of Paying Living Expenses:   Food Insecurity:   . Worried About Charity fundraiser in the Last Year:   . Arboriculturist in the Last Year:   Transportation Needs:   . Film/video editor (Medical):   Marland Kitchen Lack of Transportation (Non-Medical):   Physical Activity:   . Days of Exercise per Week:   . Minutes of Exercise per Session:   Stress:   . Feeling of Stress :   Social Connections:   . Frequency of Communication with Friends and Family:   . Frequency of Social Gatherings with Friends and Family:   .  Attends Religious Services:   . Active Member of Clubs or Organizations:   . Attends Archivist Meetings:   Marland Kitchen Marital Status:    Allergies  Allergen Reactions  . Penicillins Hives    Rash around neck  Has patient had a PCN reaction causing immediate rash, facial/tongue/throat swelling, SOB or lightheadedness with hypotension: Yes Has patient had a PCN reaction causing severe rash involving mucus membranes or skin necrosis: No Has patient had a PCN reaction that required hospitalization: No Has patient had a PCN reaction occurring within the last 10 years: No If all of the above answers are "NO", then may proceed with Cephalosporin use.   Family History  Adopted: Yes     Current  Outpatient Medications (Cardiovascular):  .  rosuvastatin (CRESTOR) 10 MG tablet, Take 1 tablet (10 mg total) by mouth daily. Marland Kitchen  telmisartan (MICARDIS) 80 MG tablet, Take 1 tablet (80 mg total) by mouth daily. Annual appt due in May must see provider for future refills  Current Outpatient Medications (Respiratory):  .  cetirizine (ZYRTEC) 10 MG tablet, Take 10 mg by mouth at bedtime.   Current Outpatient Medications (Analgesics):  .  aspirin (ASPIR-LOW) 81 MG EC tablet,  .  meloxicam (MOBIC) 15 MG tablet, TAKE ONE TABLET BY MOUTH DAILY .  nabumetone (RELAFEN) 750 MG tablet, Take 1 tablet (750 mg total) by mouth daily.   Current Outpatient Medications (Other):  Marland Kitchen  Cholecalciferol (VITAMIN D) 50 MCG (2000 UT) CAPS,  .  Ginger, Zingiber officinalis, (GINGER ROOT) 550 MG CAPS,  .  Krill Oil 300 MG CAPS, Take 300 mg by mouth at bedtime.  .  Misc Natural Products (TART CHERRY ADVANCED) CAPS,  .  Multiple Vitamin (MULTIVITAMIN WITH MINERALS) TABS tablet, Take 1 tablet by mouth daily. Men's Over 50  .  Turmeric Curcumin 500 MG CAPS, Take 500 mg by mouth daily.    Reviewed prior external information including notes and imaging from  primary care provider As well as notes that were available from care everywhere and other healthcare systems.  Past medical history, social, surgical and family history all reviewed in electronic medical record.  No pertanent information unless stated regarding to the chief complaint.   Review of Systems:  No headache, visual changes, nausea, vomiting, diarrhea, constipation, dizziness, abdominal pain, skin rash, fevers, chills, night sweats, weight loss, swollen lymph nodes, body aches, joint swelling, chest pain, shortness of breath, mood changes. POSITIVE muscle aches  Objective  There were no vitals taken for this visit.   General: No apparent distress alert and oriented x3 mood and affect normal, dressed appropriately.  HEENT: Pupils equal, extraocular  movements intact  Respiratory: Patient's speak in full sentences and does not appear short of breath  Cardiovascular: No lower extremity edema, non tender, no erythema  Neuro: Cranial nerves II through XII are intact, neurovascularly intact in all extremities with 2+ DTRs and 2+ pulses.  Gait antalgic MSK:   Continued instability of the left knee.  Positive McMurray's.  Patient does have fullness in the popliteal area.  Does have full range of motion.  Neurovascularly intact distally.  Procedure: Real-time Ultrasound Guided Injection of left knee-Baker's cyst Device: GE Logiq Q7 Ultrasound guided injection is preferred based studies that show increased duration, increased effect, greater accuracy, decreased procedural pain, increased response rate, and decreased cost with ultrasound guided versus blind injection.  Verbal informed consent obtained.  Time-out conducted.  Noted no overlying erythema, induration, or other signs of local infection.  Skin prepped in a sterile fashion.  Local anesthesia: Topical Ethyl chloride.  With sterile technique and under real time ultrasound guidance: With a 22-gauge 2 inch needle patient was injected with 4 cc of 0.5% Marcaine and aspirated 40 cc of straw-colored fluid then injected 1 cc of Kenalog 40 mg/dL. This was from a posterior ral approach.  Completed without difficulty  Pain immediately resolved suggesting accurate placement of the medication.  Advised to call if fevers/chills, erythema, induration, drainage, or persistent bleeding.  Images permanently stored and available for review in the ultrasound unit.  Impression: Technically successful ultrasound guided injection.    Impression and Recommendations:     The above documentation has been reviewed and is accurate and complete Lyndal Pulley, DO       Note: This dictation was prepared with Dragon dictation along with smaller phrase technology. Any transcriptional errors that result from  this process are unintentional.

## 2019-09-27 ENCOUNTER — Other Ambulatory Visit: Payer: Self-pay | Admitting: Internal Medicine

## 2019-09-27 NOTE — Telephone Encounter (Signed)
Please refill as per office routine med refill policy (all routine meds refilled for 3 mo or monthly per pt preference up to one year from last visit, then month to month grace period for 3 mo, then further med refills will have to be denied)  

## 2019-09-28 ENCOUNTER — Other Ambulatory Visit: Payer: Self-pay | Admitting: Internal Medicine

## 2019-10-01 ENCOUNTER — Other Ambulatory Visit: Payer: Self-pay

## 2019-10-01 ENCOUNTER — Encounter: Payer: Self-pay | Admitting: Internal Medicine

## 2019-10-01 ENCOUNTER — Telehealth: Payer: Self-pay

## 2019-10-01 MED ORDER — TELMISARTAN 80 MG PO TABS: 80.0000 mg | ORAL_TABLET | Freq: Every day | ORAL | 0 refills | Status: DC

## 2019-10-01 NOTE — Telephone Encounter (Signed)
New message  The patient wants to know why his medication was denied telmisartan (MICARDIS) 80 MG tablet  Last seen 6.11.21 asking for a call back    [facility] [address] [place]

## 2019-10-01 NOTE — Telephone Encounter (Signed)
Medication was sent in   

## 2019-10-02 ENCOUNTER — Ambulatory Visit: Payer: 59 | Admitting: Orthopedic Surgery

## 2019-10-02 DIAGNOSIS — M1712 Unilateral primary osteoarthritis, left knee: Secondary | ICD-10-CM | POA: Diagnosis not present

## 2019-10-04 ENCOUNTER — Encounter: Payer: Self-pay | Admitting: Orthopedic Surgery

## 2019-10-04 NOTE — Progress Notes (Signed)
Office Visit Note   Patient: Patrick [name]           Date of Birth: June 29, 1960           MRN: [id number] Visit Date: 10/02/2019 Requested by: Lyndal Pulley, DO [place],  [place] [address] PCP: Biagio Borg, MD  Subjective: Chief Complaint  Patient presents with  . Left Knee - Pain    HPI: Patrick [name] is a 59 year old patient with left knee pain.  He has had pain for years but it has been worse since May.  Initially was very difficult for him to weight-bear but he had aspiration in June with cortisone injection which helped him for about 1 week.  He subsequently developed some recurrent weakness and giving way.  Denies much in the way of pain that wakes him from sleep at night.  Bracing when he was doing yard work made his symptoms worse.  Tries to do low impact cardio at the gym but feels a crunching sensation.  Had the knee aspirated on July 20 with good relief and currently he would describe his left knee situation as "livable".  Mobic is not giving him much relief.  Relafen has helped some.  He does ice 2 times a day.  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  He has had prior history of arthroscopy back in the 90s.  Has a known torn ACL which was never fixed.  He denies much in the way of instability.  MRI scan from September 17, 2019 is reviewed.  It shows primarily advanced tricompartmental degenerative changes in the medial compartment along with some bone bruising and stress reaction in the medial tibial plateau with complex radial tear involving the posterior horn of the medial meniscus with detachment from the medial root and meniscal protrusion of the meniscus.              ROS: All systems reviewed are negative as they relate to the chief complaint within the history of present illness.  Patient denies  fevers or chills.   Assessment & Plan: Visit Diagnoses: No diagnosis found.  Plan: Impression is left knee arthritis in a  59 year old patient who is otherwise active and retired.  He has had reasonable results from aspiration and injection done within the last several months.  Patrick [name] describes his symptoms in a manner which wax and wane.  When his symptoms are severe he is definitely on board for intervention but when they are not severe his pain that he can live with.  He would like to have a higher quality of life which I think is reasonable.  We discussed at length the risk and benefits of knee replacement.  He is not a candidate for partial knee replacement due to his absent ACL.  Nonetheless I do think a press-fit knee replacement has a good chance of giving him improved pain relief and a prosthesis with good longevity.  Discussed that realistically whenever he wants to proceed with that it will be his decision.  Radiographically and clinically I think he is at the point where total knee replacement is not an unreasonable option.  He is on the young side but his quality of life is something that is important to he and his wife.  We did discuss the risk associated with total knee replacement as well as the ongoing cumulative risk of infection for some people that have total knee replacements done  at a young age.  He is going to consider his options and let us know if he wants to proceed.  In the meantime I will preapproved him for gel injection that we could also do as a temporizing measure.  Follow-Up Instructions: Return if symptoms worsen or fail to improve.   Orders:  No orders of the defined types were placed in this encounter.  No orders of the defined types were placed in this encounter.     Procedures: No procedures performed   Clinical Data: No additional findings.  Objective: Vital Signs: There were no vitals taken for this visit.  Physical Exam:   Constitutional: Patient appears well-developed HEENT:  Head: Normocephalic Eyes:EOM are normal Neck: Normal range of motion Cardiovascular: Normal  rate Pulmonary/chest: Effort normal Neurologic: Patient is alert Skin: Skin is warm Psychiatric: Patient has normal mood and affect    Ortho Exam: Ortho exam demonstrates mild effusion left knee.  Medial greater than lateral joint line tenderness.  Not too much in the way of significant instability in the knee.  Collaterals are stable to varus valgus stress at 0 and 30 degrees.  Extensor mechanism is intact.  No masses lymphadenopathy or skin changes noted in that left knee region.  Range of motion is 0-1 20.  Slight varus alignment present.  Specialty Comments:  No specialty comments available.  Imaging: No results found.   PMFS History: Patient Active Problem List   Diagnosis Date Noted  . Recurrent left knee instability 09/01/2019  . Baker's cyst of knee, left 08/12/2019  . 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  . Acute bursitis of right shoulder 02/02/2019  . Coughing 11/06/2018  . Penicillin allergy 11/06/2018  . Hip flexor tightness 09/29/2018  . 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  . Hyperglycemia 08/02/2018  . HLD (hyperlipidemia) 08/02/2018  . Vitamin D deficiency 08/02/2018  . Preventative health care 07/30/2018  . HTN (hypertension) 07/30/2018  . Other allergic rhinitis 02/21/2015   Past Medical History:  Diagnosis Date  . Allergy   . Heart murmur   . History of kidney stones   . Hypertension     Family History  Adopted: Yes    Past Surgical History:  Procedure Laterality Date  . APPENDECTOMY    . Arthroscopic knee surgeries on both knees    . CHOLECYSTECTOMY    . NASAL SEPTOPLASTY W/ TURBINOPLASTY Bilateral 04/13/2019   Procedure: NASAL SEPTOPLASTY WITH BILATERAL TURBINATE REDUCTION;  Surgeon: Leta Baptist, MD;  Location: [place];  Service: ENT;  Laterality: Bilateral;  . VASECTOMY     Social History   Occupational History  . Occupation: Print production planner, Financial controller, Semi-retired  Tobacco Use  . Smoking status: Former Smoker     Packs/day: 0.00    Types: Cigars  . Smokeless tobacco: Never Used  Vaping Use  . Vaping Use: Never used  Substance and Sexual Activity  . Alcohol use: Yes    Alcohol/week: 5.0 standard drinks    Types: 5 Standard drinks or equivalent per week  . Drug use: No  . Sexual activity: Yes

## 2019-10-05 ENCOUNTER — Telehealth: Payer: Self-pay

## 2019-10-05 NOTE — Telephone Encounter (Signed)
-----   Message from Laurann Montana, RT sent at 10/04/2019  3:01 PM EDT ----- Can you work on this? April out of office this week.  ----- Message ----- From: Meredith Pel, MD Sent: 10/04/2019   8:08 AM EDT To: Laurann Montana, RT  Hi Lauren can you preapproved gel shots for Patrick [name] thank you left knee.

## 2019-10-05 NOTE — Telephone Encounter (Signed)
Pt has [facility]. Submitted for VOB with [employer].

## 2019-10-06 ENCOUNTER — Other Ambulatory Visit: Payer: Self-pay | Admitting: Family Medicine

## 2019-10-06 ENCOUNTER — Telehealth: Payer: Self-pay

## 2019-10-06 NOTE — Telephone Encounter (Signed)
Approved for [facility]-Left knee Dr. Latanya Presser and Bill $40 copay No OOP No prior auth required

## 2019-10-06 NOTE — Telephone Encounter (Signed)
Pt called and advised. He will call back once a decision has been made on if he wants to proceed with gel shot or knee surgery

## 2019-10-08 ENCOUNTER — Telehealth: Payer: Self-pay

## 2019-10-08 NOTE — Telephone Encounter (Signed)
Error from [place] rep pts insurance requires PA, [facility] ususal denies gel injections. Will complete PA to confirm

## 2019-10-09 NOTE — Telephone Encounter (Signed)
Faxed PA to [facility]

## 2019-10-13 ENCOUNTER — Telehealth: Payer: Self-pay | Admitting: Orthopedic Surgery

## 2019-10-13 NOTE — Telephone Encounter (Signed)
thx

## 2019-10-13 NOTE — Telephone Encounter (Signed)
I called patient and advised his insurance does not cover gel injections at all, that they rep had obtained incorrect info from [facility]    He says he was thinking to do the knee replacement sooner anyway, maybe mid November and he will call Debbie to discuss scheduling.  FYI only

## 2019-10-13 NOTE — Telephone Encounter (Signed)
Received call from [place]. With [facility] stating the injection monovisc was denied code [phone] and [id number]. Pat Patrick said a denial letter will be sent in the next 5 days. The number to contact Louretta Parma is [phone]    Ref# [id number]

## 2019-10-13 NOTE — Telephone Encounter (Signed)
Please see message. °

## 2019-10-13 NOTE — Telephone Encounter (Signed)
Received denial from insurance company for injection

## 2019-10-13 NOTE — Telephone Encounter (Signed)
I called, LM for patient to call me to discuss injections.

## 2019-10-14 ENCOUNTER — Ambulatory Visit: Payer: 59 | Admitting: Family Medicine

## 2019-10-19 ENCOUNTER — Telehealth: Payer: Self-pay | Admitting: Orthopedic Surgery

## 2019-10-19 NOTE — Telephone Encounter (Signed)
Patient would like to schedule left total knee. A date of 01-21-20 is being held.  Please provide blue sheet.

## 2019-10-19 NOTE — Telephone Encounter (Signed)
See below. Can you please provide sheet. Thanks.

## 2019-10-21 NOTE — Telephone Encounter (Signed)
Done please call thanks

## 2019-10-23 ENCOUNTER — Ambulatory Visit: Payer: 59 | Admitting: Family Medicine

## 2019-10-30 ENCOUNTER — Other Ambulatory Visit: Payer: Self-pay | Admitting: Internal Medicine

## 2019-10-30 MED ORDER — TELMISARTAN 80 MG PO TABS
80.0000 mg | ORAL_TABLET | Freq: Every day | ORAL | 3 refills | Status: DC
Start: 2019-10-30 — End: 2020-11-29

## 2019-11-04 ENCOUNTER — Other Ambulatory Visit: Payer: Self-pay | Admitting: Family Medicine

## 2019-12-08 ENCOUNTER — Encounter: Payer: Self-pay | Admitting: Allergy

## 2019-12-08 NOTE — Progress Notes (Signed)
Note from Dr. Deeann Saint office - ENT. Patient had septoplasty and turbinate reduction surgery in February 2021 with good benefit. See scanned notes for further details.

## 2019-12-16 ENCOUNTER — Ambulatory Visit (INDEPENDENT_AMBULATORY_CARE_PROVIDER_SITE_OTHER): Payer: 59 | Admitting: Orthopedic Surgery

## 2019-12-16 ENCOUNTER — Encounter: Payer: Self-pay | Admitting: Orthopedic Surgery

## 2019-12-16 DIAGNOSIS — M1712 Unilateral primary osteoarthritis, left knee: Secondary | ICD-10-CM | POA: Diagnosis not present

## 2019-12-16 MED ORDER — LIDOCAINE HCL 1 % IJ SOLN
5.0000 mL | INTRAMUSCULAR | Status: AC | PRN
  Administered 2019-12-16: 5 mL

## 2019-12-16 NOTE — Progress Notes (Signed)
Office Visit Note   Patient: Patrick [name]           Date of Birth: 01-26-1961           MRN: [id number] Visit Date: 12/16/2019 Requested by: Biagio Borg, MD [place],  [place] [address] PCP: Biagio Borg, MD  Subjective: Chief Complaint  Patient presents with  . Left Knee - Pain    HPI: Patrick [name] is a 59 year old patient with left knee pain.  Scheduled for knee replacement surgery in November.  Trying to get the leg and is good a shape as possible prior to surgery.  Has known history of Baker's cyst and has some recurrent effusion today.  The pain will wake him from sleep at night.  He is able to play golf and shoot in the 90s.  Last time the knee was drained and injected was in July.  Does report some posterior knee pain with flexion.  Ice used to help but does not help as much anymore.              ROS: All systems reviewed are negative as they relate to the chief complaint within the history of present illness.  Patient denies  fevers or chills.   Assessment & Plan: Visit Diagnoses:  1. Arthritis of left knee     Plan: Impression is left knee pain with effusion and possibly symptomatic Baker's cyst.  Plan is aspiration of both the knee and the cyst.  Risk and benefits are discussed.  We will see the patient in November for his knee replacement.  All questions answered  Follow-Up Instructions: No follow-ups on file.   Orders:  No orders of the defined types were placed in this encounter.  No orders of the defined types were placed in this encounter.     Procedures: Large Joint Inj: L knee on 12/16/2019 11:43 PM Indications: diagnostic evaluation, joint swelling and pain Details: 18 G 1.5 in needle, superolateral approach  Arthrogram: No  Medications: 5 mL lidocaine 1 % Aspirate: 20 mL yellow Outcome: tolerated well, no immediate complications Procedure, treatment alternatives, risks and benefits explained, specific risks discussed. Consent was given  by the patient. Immediately prior to procedure a time out was called to verify the correct patient, procedure, equipment, support staff and site/side marked as required. Patient was prepped and draped in the usual sterile fashion.   Large Joint Inj: L knee on 12/16/2019 11:44 PM Details: ultrasound-guided posterior approach Medications: 5 mL lidocaine 1 % Aspirate: 20 mL blood-tinged  20 cc of Baker's cyst fluid aspirated from the left knee under ultrasound guidance      Clinical Data: No additional findings.  Objective: Vital Signs: There were no vitals taken for this visit.  Physical Exam:   Constitutional: Patient appears well-developed HEENT:  Head: Normocephalic Eyes:EOM are normal Neck: Normal range of motion Cardiovascular: Normal rate Pulmonary/chest: Effort normal Neurologic: Patient is alert Skin: Skin is warm Psychiatric: Patient has normal mood and affect    Ortho Exam: Ortho exam demonstrates full active and passive range of motion of the right knee.  Left knee demonstrates about a 5 to 8 degree flexion contracture with flexion to 115.  Collateral crucial ligaments are stable.  Has very good muscle strength around the knee.  Baker's cyst palpable and mild effusion is present.  Skin is intact in the knee region.  Pedal pulses palpable and ankle dorsiflexion intact  Specialty Comments:  No specialty comments available.  Imaging: No results found.   PMFS History: Patient Active Problem List   Diagnosis Date Noted  . Recurrent left knee instability 09/01/2019  . Baker's cyst of knee, left 08/12/2019  . 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  . Acute bursitis of right shoulder 02/02/2019  . Coughing 11/06/2018  . Penicillin allergy 11/06/2018  . Hip flexor tightness 09/29/2018  . 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  . Hyperglycemia 08/02/2018  . HLD (hyperlipidemia) 08/02/2018  . Vitamin D deficiency 08/02/2018  . Preventative health care  07/30/2018  . HTN (hypertension) 07/30/2018  . Other allergic rhinitis 02/21/2015   Past Medical History:  Diagnosis Date  . Allergy   . Heart murmur   . History of kidney stones   . Hypertension     Family History  Adopted: Yes    Past Surgical History:  Procedure Laterality Date  . APPENDECTOMY    . Arthroscopic knee surgeries on both knees    . CHOLECYSTECTOMY    . NASAL SEPTOPLASTY W/ TURBINOPLASTY Bilateral 04/13/2019   Procedure: NASAL SEPTOPLASTY WITH BILATERAL TURBINATE REDUCTION;  Surgeon: Leta Baptist, MD;  Location: [facility];  Service: ENT;  Laterality: Bilateral;  . VASECTOMY     Social History   Occupational History  . Occupation: Print production planner, Financial controller, Semi-retired  Tobacco Use  . Smoking status: Former Smoker    Packs/day: 0.00    Types: Cigars  . Smokeless tobacco: Never Used  Vaping Use  . Vaping Use: Never used  Substance and Sexual Activity  . Alcohol use: Yes    Alcohol/week: 5.0 standard drinks    Types: 5 Standard drinks or equivalent per week  . Drug use: No  . Sexual activity: Yes

## 2019-12-17 ENCOUNTER — Encounter: Payer: Self-pay | Admitting: Orthopedic Surgery

## 2020-01-06 ENCOUNTER — Other Ambulatory Visit: Payer: Self-pay

## 2020-01-14 NOTE — Progress Notes (Signed)
Your procedure is scheduled on Thursday, November 18th.  Report to [facility] Main Entrance "A" at 10:15 A.M., and check in at the Admitting office.  Call this number if you have problems the morning of surgery:  [phone]  Call [phone] if you have any questions prior to your surgery date Monday-Friday 8am-4pm   Remember:  Do not eat after midnight the night before your surgery  You may drink clear liquids until 9:15 A.M. the morning of your surgery.   Clear liquids allowed are: Water, Non-Citrus Juices (without pulp), Carbonated Beverages, Clear Tea, Black Coffee Only, and Gatorade    Enhanced Recovery after Surgery for Orthopedics Enhanced Recovery after Surgery is a protocol used to improve the stress on your body and your recovery after surgery.  Patient Instructions  . The night before surgery:  o No food after midnight. ONLY clear liquids after midnight  .  Marland Kitchen The day of surgery (if you do NOT have diabetes):  o Drink ONE (1) Pre-Surgery Clear Ensure by 9:15 A.M. the morning of surgery  o This drink was given to you during your hospital  pre-op appointment visit. o Nothing else to drink after completing the  Pre-Surgery Clear Ensure.         If you have questions, please contact your surgeon's office.   Take these medicines the morning of surgery with A SIP OF WATER  rosuvastatin (CRESTOR)  Follow your surgeon's instructions on when to stop Aspirin.  If no instructions were given by your surgeon then you will need to call the office to get those instructions.    As of today, STOP taking aAleve, Naproxen, Ibuprofen, Motrin, Advil, Goody's, BC's, all herbal medications, fish oil, and all vitamins.                     Do not wear jewelry.            Do not wear lotions, powders, colognes, or deodorant.            Men may shave face and neck.            Do not bring valuables to the hospital.            [facility] is not responsible for any belongings or  valuables.  Do NOT Smoke (Tobacco/Vaping) or drink Alcohol 24 hours prior to your procedure If you use a CPAP at night, you may bring all equipment for your overnight stay.   Contacts, glasses, dentures or bridgework may not be worn into surgery.      For patients admitted to the hospital, discharge time will be determined by your treatment team.   Patients discharged the day of surgery will not be allowed to drive home, and someone needs to stay with them for 24 hours.  Special instructions:   [facility]- Preparing For Surgery  Before surgery, you can play an important role. Because skin is not sterile, your skin needs to be as free of germs as possible. You can reduce the number of germs on your skin by washing with CHG (chlorahexidine gluconate) Soap before surgery.  CHG is an antiseptic cleaner which kills germs and bonds with the skin to continue killing germs even after washing.    Oral Hygiene is also important to reduce your risk of infection.  Remember - BRUSH YOUR TEETH THE MORNING OF SURGERY WITH YOUR REGULAR TOOTHPASTE  Please do not use if you have an allergy to CHG or  antibacterial soaps. If your skin becomes reddened/irritated stop using the CHG.  Do not shave (including legs and underarms) for at least 48 hours prior to first CHG shower. It is OK to shave your face.  Please follow these instructions carefully.   1. Shower the NIGHT BEFORE SURGERY and the MORNING OF SURGERY with CHG Soap.   2. If you chose to wash your hair, wash your hair first as usual with your normal shampoo.  3. After you shampoo, rinse your hair and body thoroughly to remove the shampoo.  4. Use CHG as you would any other liquid soap. You can apply CHG directly to the skin and wash gently with a scrungie or a clean washcloth.   5. Apply the CHG Soap to your body ONLY FROM THE NECK DOWN.  Do not use on open wounds or open sores. Avoid contact with your eyes, ears, mouth and genitals (private  parts). Wash Face and genitals (private parts)  with your normal soap.   6. Wash thoroughly, paying special attention to the area where your surgery will be performed.  7. Thoroughly rinse your body with warm water from the neck down.  8. DO NOT shower/wash with your normal soap after using and rinsing off the CHG Soap.  9. Pat yourself dry with a CLEAN TOWEL.  10. Wear CLEAN PAJAMAS to bed the night before surgery  11. Place CLEAN SHEETS on your bed the night of your first shower and DO NOT SLEEP WITH PETS.  Day of Surgery: Wear Clean/Comfortable clothing the morning of surgery Do not apply any deodorants/lotions.   Remember to brush your teeth WITH YOUR REGULAR TOOTHPASTE.   Please read over the following fact sheets that you were given.

## 2020-01-15 ENCOUNTER — Other Ambulatory Visit: Payer: Self-pay

## 2020-01-15 ENCOUNTER — Encounter (HOSPITAL_COMMUNITY)
Admission: RE | Admit: 2020-01-15 | Discharge: 2020-01-15 | Disposition: A | Discharge status: Home / Self Care | Payer: 59 | Source: Ambulatory Visit | Attending: Orthopedic Surgery | Admitting: Orthopedic Surgery

## 2020-01-15 ENCOUNTER — Encounter (HOSPITAL_COMMUNITY): Payer: Self-pay

## 2020-01-15 DIAGNOSIS — Z01812 Encounter for preprocedural laboratory examination: Secondary | ICD-10-CM | POA: Diagnosis present

## 2020-01-15 HISTORY — DX: Personal history of other diseases of the digestive system: Z87.19

## 2020-01-15 HISTORY — DX: Cardiac arrhythmia, unspecified: I49.9

## 2020-01-15 HISTORY — DX: Unspecified osteoarthritis, unspecified site: M19.90

## 2020-01-15 LAB — CBC
HCT: 47.7 % (ref 39.0–52.0)
Hemoglobin: 15.9 g/dL (ref 13.0–17.0)
MCH: 30.6 pg (ref 26.0–34.0)
MCHC: 33.3 g/dL (ref 30.0–36.0)
MCV: 91.7 fL (ref 80.0–100.0)
Platelets: 251 10*3/uL (ref 150–400)
RBC: 5.2 MIL/uL (ref 4.22–5.81)
RDW: 11.5 % (ref 11.5–15.5)
WBC: 6.7 10*3/uL (ref 4.0–10.5)
nRBC: 0 % (ref 0.0–0.2)

## 2020-01-15 LAB — URINALYSIS, ROUTINE W REFLEX MICROSCOPIC
Bilirubin Urine: NEGATIVE
Glucose, UA: NEGATIVE mg/dL
Hgb urine dipstick: NEGATIVE
Ketones, ur: NEGATIVE mg/dL
Leukocytes,Ua: NEGATIVE
Nitrite: NEGATIVE
Protein, ur: NEGATIVE mg/dL
Specific Gravity, Urine: 1.018 (ref 1.005–1.030)
pH: 5 (ref 5.0–8.0)

## 2020-01-15 LAB — BASIC METABOLIC PANEL
Anion gap: 9 (ref 5–15)
BUN: 21 mg/dL — ABNORMAL HIGH (ref 6–20)
CO2: 25 mmol/L (ref 22–32)
Calcium: 9.7 mg/dL (ref 8.9–10.3)
Chloride: 104 mmol/L (ref 98–111)
Creatinine, Ser: 0.83 mg/dL (ref 0.61–1.24)
GFR, Estimated: >60 mL/min (ref >60)
Glucose, Bld: 102 mg/dL — ABNORMAL HIGH (ref 70–99)
Potassium: 4.3 mmol/L (ref 3.5–5.1)
Sodium: 138 mmol/L (ref 135–145)

## 2020-01-15 LAB — SURGICAL PCR SCREEN
MRSA, PCR: NEGATIVE
Staphylococcus aureus: POSITIVE — AB

## 2020-01-15 NOTE — Pre-Procedure Instructions (Addendum)
PCP - Ina Kick, [place] Cardiologist - records requested from [facility] in [place] ENT: Leta Baptist  PPM/ICD - denies   Chest x-ray - denies EKG - 03/19/2019 Stress Test -  records requested from [facility] in [place] ECHO -  records requested from [facility] in [place] Cardiac Cath - denies  Sleep Study - denies   Aspirin Instructions: Patient instructed to hold all Aspirin, NSAID's, herbal medications, fish oil and vitamins 7 days prior to surgery.   ERAS Protcol -yes,. Water (pt is on keto diet)  COVID TEST- scheduled for 01/19/2020   Anesthesia review: yes, cardiac history (echo and stress test) in [place] Addendum 01/20/20 by Karoline Caldwell, PA-C: No records received. Pt had recent surgery Feb 1771 without complication. HTN is managed by PCP Dr. Cathlean Cower, records in [place], no record of murmur or cardiac disease in pt history.  Patient denies shortness of breath, fever, cough and chest pain at PAT appointment   All instructions explained to the patient, with a verbal understanding of the material. Patient agrees to go over the instructions while at home for a better understanding. Patient also instructed to self quarantine after being tested for COVID-19. The opportunity to ask questions was provided.

## 2020-01-16 LAB — URINE CULTURE: Culture: NO GROWTH

## 2020-01-19 ENCOUNTER — Other Ambulatory Visit (HOSPITAL_COMMUNITY)
Admission: RE | Admit: 2020-01-19 | Discharge: 2020-01-19 | Disposition: A | Discharge status: Home / Self Care | Payer: 59 | Source: Ambulatory Visit | Attending: Orthopedic Surgery | Admitting: Orthopedic Surgery

## 2020-01-19 DIAGNOSIS — Z01812 Encounter for preprocedural laboratory examination: Secondary | ICD-10-CM | POA: Insufficient documentation

## 2020-01-19 DIAGNOSIS — Z20822 Contact with and (suspected) exposure to covid-19: Secondary | ICD-10-CM | POA: Diagnosis not present

## 2020-01-19 LAB — SARS CORONAVIRUS 2 (TAT 6-24 HRS): SARS Coronavirus 2: NEGATIVE

## 2020-01-20 MED ORDER — VANCOMYCIN HCL 1500 MG/300ML IV SOLN
1500.0000 mg | INTRAVENOUS | Status: AC
  Administered 2020-01-21: 1500 mg via INTRAVENOUS
  Filled 2020-01-20: qty 300

## 2020-01-20 MED ORDER — TRANEXAMIC ACID 1000 MG/10ML IV SOLN
2000.0000 mg | INTRAVENOUS | Status: AC
  Administered 2020-01-21: 2000 mg via TOPICAL
  Filled 2020-01-20: qty 20

## 2020-01-21 ENCOUNTER — Ambulatory Visit (HOSPITAL_COMMUNITY): Payer: 59 | Admitting: Anesthesiology

## 2020-01-21 ENCOUNTER — Ambulatory Visit (HOSPITAL_COMMUNITY): Payer: 59 | Admitting: Physician Assistant

## 2020-01-21 ENCOUNTER — Encounter (HOSPITAL_COMMUNITY)
Admission: RE | Disposition: A | Discharge status: Home / Self Care | Payer: Self-pay | Source: Home / Self Care | Attending: Orthopedic Surgery

## 2020-01-21 ENCOUNTER — Observation Stay (HOSPITAL_COMMUNITY)
Admission: RE | Admit: 2020-01-21 | Discharge: 2020-01-22 | Disposition: A | Discharge status: Home / Self Care | Payer: 59 | Attending: Orthopedic Surgery | Admitting: Orthopedic Surgery

## 2020-01-21 ENCOUNTER — Encounter (HOSPITAL_COMMUNITY): Payer: Self-pay | Admitting: Orthopedic Surgery

## 2020-01-21 DIAGNOSIS — M1712 Unilateral primary osteoarthritis, left knee: Secondary | ICD-10-CM

## 2020-01-21 DIAGNOSIS — Z79899 Other long term (current) drug therapy: Secondary | ICD-10-CM | POA: Insufficient documentation

## 2020-01-21 DIAGNOSIS — Z87891 Personal history of nicotine dependence: Secondary | ICD-10-CM | POA: Diagnosis not present

## 2020-01-21 DIAGNOSIS — I10 Essential (primary) hypertension: Secondary | ICD-10-CM | POA: Insufficient documentation

## 2020-01-21 DIAGNOSIS — Z96652 Presence of left artificial knee joint: Secondary | ICD-10-CM

## 2020-01-21 DIAGNOSIS — M25562 Pain in left knee: Secondary | ICD-10-CM | POA: Diagnosis present

## 2020-01-21 HISTORY — PX: TOTAL KNEE ARTHROPLASTY: SHX125

## 2020-01-21 SURGERY — ARTHROPLASTY, KNEE, TOTAL
Anesthesia: Monitor Anesthesia Care | Site: Knee | Laterality: Left

## 2020-01-21 MED ORDER — DOCUSATE SODIUM 100 MG PO CAPS
100.0000 mg | ORAL_CAPSULE | Freq: Two times a day (BID) | ORAL | Status: DC
  Administered 2020-01-21 – 2020-01-22 (×2): 100 mg via ORAL
  Filled 2020-01-21 (×2): qty 1

## 2020-01-21 MED ORDER — VANCOMYCIN HCL IN DEXTROSE 1-5 GM/200ML-% IV SOLN
1000.0000 mg | Freq: Two times a day (BID) | INTRAVENOUS | Status: AC
  Administered 2020-01-21: 1000 mg via INTRAVENOUS
  Filled 2020-01-21: qty 200

## 2020-01-21 MED ORDER — ACETAMINOPHEN 160 MG/5ML PO SOLN: 325.0000 mg | ORAL | Status: DC | PRN

## 2020-01-21 MED ORDER — VANCOMYCIN HCL 1000 MG IV SOLR
INTRAVENOUS | Status: DC | PRN
  Administered 2020-01-21: 1000 mg via TOPICAL

## 2020-01-21 MED ORDER — METHOCARBAMOL 1000 MG/10ML IJ SOLN
500.0000 mg | Freq: Four times a day (QID) | INTRAVENOUS | Status: DC | PRN
  Filled 2020-01-21: qty 5

## 2020-01-21 MED ORDER — MORPHINE SULFATE (PF) 4 MG/ML IV SOLN
INTRAVENOUS | Status: DC | PRN
  Administered 2020-01-21: 8 mg

## 2020-01-21 MED ORDER — CHLORHEXIDINE GLUCONATE 0.12 % MT SOLN
OROMUCOSAL | Status: AC
  Administered 2020-01-21: 15 mL via OROMUCOSAL
  Filled 2020-01-21: qty 15

## 2020-01-21 MED ORDER — DEXAMETHASONE SODIUM PHOSPHATE 10 MG/ML IJ SOLN
INTRAMUSCULAR | Status: DC | PRN
  Administered 2020-01-21: 10 mg

## 2020-01-21 MED ORDER — IRRISEPT - 450ML BOTTLE WITH 0.05% CHG IN STERILE WATER, USP 99.95% OPTIME
TOPICAL | Status: DC | PRN
  Administered 2020-01-21: 450 mL via TOPICAL

## 2020-01-21 MED ORDER — KETOROLAC TROMETHAMINE 0.5 % OP SOLN
1.0000 [drp] | Freq: Three times a day (TID) | OPHTHALMIC | Status: DC | PRN
  Administered 2020-01-21: 1 [drp] via OPHTHALMIC
  Filled 2020-01-21: qty 5

## 2020-01-21 MED ORDER — OXYCODONE HCL 5 MG/5ML PO SOLN: 5.0000 mg | Freq: Once | ORAL | Status: DC | PRN

## 2020-01-21 MED ORDER — MORPHINE SULFATE (PF) 4 MG/ML IV SOLN
INTRAVENOUS | Status: AC
  Filled 2020-01-21: qty 2

## 2020-01-21 MED ORDER — MIDAZOLAM HCL 2 MG/2ML IJ SOLN
INTRAMUSCULAR | Status: AC
  Filled 2020-01-21: qty 2

## 2020-01-21 MED ORDER — VANCOMYCIN HCL 1000 MG IV SOLR
INTRAVENOUS | Status: AC
  Filled 2020-01-21: qty 1000

## 2020-01-21 MED ORDER — OXYCODONE HCL 5 MG PO TABS
5.0000 mg | ORAL_TABLET | ORAL | Status: DC | PRN
  Administered 2020-01-22: 10 mg via ORAL
  Filled 2020-01-21: qty 2

## 2020-01-21 MED ORDER — POVIDONE-IODINE 10 % EX SWAB
2.0000 "application " | Freq: Once | CUTANEOUS | Status: AC
  Administered 2020-01-21: 2 via TOPICAL

## 2020-01-21 MED ORDER — POVIDONE-IODINE 7.5 % EX SOLN: Freq: Once | CUTANEOUS | Status: DC

## 2020-01-21 MED ORDER — BUPIVACAINE LIPOSOME 1.3 % IJ SUSP
20.0000 mL | Freq: Once | INTRAMUSCULAR | Status: DC
  Filled 2020-01-21: qty 20

## 2020-01-21 MED ORDER — EPHEDRINE SULFATE-NACL 50-0.9 MG/10ML-% IV SOSY
PREFILLED_SYRINGE | INTRAVENOUS | Status: DC | PRN
  Administered 2020-01-21 (×2): 10 mg via INTRAVENOUS

## 2020-01-21 MED ORDER — BUPIVACAINE HCL 0.25 % IJ SOLN
INTRAMUSCULAR | Status: DC | PRN
  Administered 2020-01-21: 30 mL

## 2020-01-21 MED ORDER — PHENOL 1.4 % MT LIQD: 1.0000 | OROMUCOSAL | Status: DC | PRN

## 2020-01-21 MED ORDER — ASPIRIN 81 MG PO CHEW
81.0000 mg | CHEWABLE_TABLET | Freq: Two times a day (BID) | ORAL | Status: DC
  Administered 2020-01-21 – 2020-01-22 (×2): 81 mg via ORAL
  Filled 2020-01-21 (×2): qty 1

## 2020-01-21 MED ORDER — DEXAMETHASONE SODIUM PHOSPHATE 10 MG/ML IJ SOLN
INTRAMUSCULAR | Status: DC | PRN
  Administered 2020-01-21: 5 mg via INTRAVENOUS

## 2020-01-21 MED ORDER — ONDANSETRON HCL 4 MG/2ML IJ SOLN: 4.0000 mg | Freq: Once | INTRAMUSCULAR | Status: DC | PRN

## 2020-01-21 MED ORDER — CLONIDINE HCL (ANALGESIA) 100 MCG/ML EP SOLN
EPIDURAL | Status: AC
  Filled 2020-01-21: qty 10

## 2020-01-21 MED ORDER — ONDANSETRON HCL 4 MG/2ML IJ SOLN
INTRAMUSCULAR | Status: DC | PRN
  Administered 2020-01-21: 4 mg via INTRAVENOUS

## 2020-01-21 MED ORDER — MENTHOL 3 MG MT LOZG: 1.0000 | LOZENGE | OROMUCOSAL | Status: DC | PRN

## 2020-01-21 MED ORDER — MIDAZOLAM HCL 2 MG/2ML IJ SOLN
INTRAMUSCULAR | Status: DC | PRN
  Administered 2020-01-21: 1 mg via INTRAVENOUS

## 2020-01-21 MED ORDER — METHOCARBAMOL 500 MG PO TABS
500.0000 mg | ORAL_TABLET | Freq: Four times a day (QID) | ORAL | Status: DC | PRN
  Administered 2020-01-22: 500 mg via ORAL
  Filled 2020-01-21: qty 1

## 2020-01-21 MED ORDER — HYDROMORPHONE HCL 1 MG/ML IJ SOLN: 0.5000 mg | INTRAMUSCULAR | Status: DC | PRN

## 2020-01-21 MED ORDER — PROPOFOL 10 MG/ML IV BOLUS
INTRAVENOUS | Status: DC | PRN
  Administered 2020-01-21: 30 mg via INTRAVENOUS
  Administered 2020-01-21 (×2): 20 mg via INTRAVENOUS

## 2020-01-21 MED ORDER — KETOROLAC TROMETHAMINE 0.5 % OP SOLN
OPHTHALMIC | Status: AC
  Filled 2020-01-21: qty 5

## 2020-01-21 MED ORDER — PROPOFOL 500 MG/50ML IV EMUL
INTRAVENOUS | Status: DC | PRN
  Administered 2020-01-21: 75 ug/kg/min via INTRAVENOUS

## 2020-01-21 MED ORDER — LACTATED RINGERS IV SOLN: INTRAVENOUS | Status: DC

## 2020-01-21 MED ORDER — ACETAMINOPHEN 500 MG PO TABS
1000.0000 mg | ORAL_TABLET | Freq: Four times a day (QID) | ORAL | Status: DC
  Administered 2020-01-21 – 2020-01-22 (×3): 1000 mg via ORAL
  Filled 2020-01-21 (×3): qty 2

## 2020-01-21 MED ORDER — MIDAZOLAM HCL 2 MG/2ML IJ SOLN
INTRAMUSCULAR | Status: AC
  Administered 2020-01-21: 2 mg via INTRAVENOUS
  Filled 2020-01-21: qty 2

## 2020-01-21 MED ORDER — FENTANYL CITRATE (PF) 100 MCG/2ML IJ SOLN: 100.0000 ug | Freq: Once | INTRAMUSCULAR | Status: AC

## 2020-01-21 MED ORDER — ORAL CARE MOUTH RINSE: 15.0000 mL | Freq: Once | OROMUCOSAL | Status: AC

## 2020-01-21 MED ORDER — BUPIVACAINE LIPOSOME 1.3 % IJ SUSP
INTRAMUSCULAR | Status: DC | PRN
  Administered 2020-01-21: 20 mL

## 2020-01-21 MED ORDER — POVIDONE-IODINE 10 % EX SWAB: 2.0000 "application " | Freq: Once | CUTANEOUS | Status: DC

## 2020-01-21 MED ORDER — SODIUM CHLORIDE 0.9% FLUSH
INTRAVENOUS | Status: DC | PRN
  Administered 2020-01-21: 20 mL

## 2020-01-21 MED ORDER — IRBESARTAN 300 MG PO TABS
300.0000 mg | ORAL_TABLET | Freq: Every day | ORAL | Status: DC
  Administered 2020-01-22: 300 mg via ORAL
  Filled 2020-01-21: qty 1

## 2020-01-21 MED ORDER — MEPERIDINE HCL 25 MG/ML IJ SOLN: 6.2500 mg | INTRAMUSCULAR | Status: DC | PRN

## 2020-01-21 MED ORDER — PROPOFOL 10 MG/ML IV BOLUS
INTRAVENOUS | Status: AC
  Filled 2020-01-21: qty 20

## 2020-01-21 MED ORDER — CHLORHEXIDINE GLUCONATE 0.12 % MT SOLN: 15.0000 mL | Freq: Once | OROMUCOSAL | Status: AC

## 2020-01-21 MED ORDER — CLONIDINE HCL (ANALGESIA) 100 MCG/ML EP SOLN
EPIDURAL | Status: DC | PRN
  Administered 2020-01-21: 1 mL

## 2020-01-21 MED ORDER — ONDANSETRON HCL 4 MG PO TABS: 4.0000 mg | ORAL_TABLET | Freq: Four times a day (QID) | ORAL | Status: DC | PRN

## 2020-01-21 MED ORDER — POLYMYXIN B-TRIMETHOPRIM 10000-0.1 UNIT/ML-% OP SOLN
1.0000 [drp] | Freq: Three times a day (TID) | OPHTHALMIC | Status: DC
  Administered 2020-01-21: 1 [drp] via OPHTHALMIC
  Filled 2020-01-21 (×2): qty 10

## 2020-01-21 MED ORDER — ROPIVACAINE HCL 7.5 MG/ML IJ SOLN
INTRAMUSCULAR | Status: DC | PRN
  Administered 2020-01-21: 30 mL via PERINEURAL

## 2020-01-21 MED ORDER — 0.9 % SODIUM CHLORIDE (POUR BTL) OPTIME
TOPICAL | Status: DC | PRN
  Administered 2020-01-21 (×4): 1000 mL

## 2020-01-21 MED ORDER — FENTANYL CITRATE (PF) 250 MCG/5ML IJ SOLN
INTRAMUSCULAR | Status: DC | PRN
  Administered 2020-01-21: 50 ug via INTRAVENOUS

## 2020-01-21 MED ORDER — PHENYLEPHRINE 40 MCG/ML (10ML) SYRINGE FOR IV PUSH (FOR BLOOD PRESSURE SUPPORT)
PREFILLED_SYRINGE | INTRAVENOUS | Status: DC | PRN
  Administered 2020-01-21 (×2): 80 ug via INTRAVENOUS
  Administered 2020-01-21 (×2): 40 ug via INTRAVENOUS

## 2020-01-21 MED ORDER — FENTANYL CITRATE (PF) 100 MCG/2ML IJ SOLN
INTRAMUSCULAR | Status: AC
  Administered 2020-01-21: 100 ug via INTRAVENOUS
  Filled 2020-01-21: qty 2

## 2020-01-21 MED ORDER — METOCLOPRAMIDE HCL 5 MG/ML IJ SOLN: 5.0000 mg | Freq: Three times a day (TID) | INTRAMUSCULAR | Status: DC | PRN

## 2020-01-21 MED ORDER — OXYCODONE HCL 5 MG PO TABS: 5.0000 mg | ORAL_TABLET | Freq: Once | ORAL | Status: DC | PRN

## 2020-01-21 MED ORDER — FENTANYL CITRATE (PF) 250 MCG/5ML IJ SOLN
INTRAMUSCULAR | Status: AC
  Filled 2020-01-21: qty 5

## 2020-01-21 MED ORDER — FENTANYL CITRATE (PF) 100 MCG/2ML IJ SOLN: 25.0000 ug | INTRAMUSCULAR | Status: DC | PRN

## 2020-01-21 MED ORDER — MIDAZOLAM HCL 2 MG/2ML IJ SOLN: 2.0000 mg | Freq: Once | INTRAMUSCULAR | Status: AC

## 2020-01-21 MED ORDER — TRANEXAMIC ACID-NACL 1000-0.7 MG/100ML-% IV SOLN
1000.0000 mg | INTRAVENOUS | Status: AC
  Administered 2020-01-21: 1000 mg via INTRAVENOUS
  Filled 2020-01-21: qty 100

## 2020-01-21 MED ORDER — BUPIVACAINE IN DEXTROSE 0.75-8.25 % IT SOLN
INTRATHECAL | Status: DC | PRN
  Administered 2020-01-21: 2 mL via INTRATHECAL

## 2020-01-21 MED ORDER — ONDANSETRON HCL 4 MG/2ML IJ SOLN: 4.0000 mg | Freq: Four times a day (QID) | INTRAMUSCULAR | Status: DC | PRN

## 2020-01-21 MED ORDER — CELECOXIB 200 MG PO CAPS
200.0000 mg | ORAL_CAPSULE | Freq: Two times a day (BID) | ORAL | Status: DC
  Administered 2020-01-21 – 2020-01-22 (×2): 200 mg via ORAL
  Filled 2020-01-21 (×2): qty 1

## 2020-01-21 MED ORDER — LIDOCAINE 2% (20 MG/ML) 5 ML SYRINGE
INTRAMUSCULAR | Status: DC | PRN
  Administered 2020-01-21: 60 mg via INTRAVENOUS

## 2020-01-21 MED ORDER — BUPIVACAINE HCL (PF) 0.25 % IJ SOLN
INTRAMUSCULAR | Status: AC
  Filled 2020-01-21: qty 30

## 2020-01-21 MED ORDER — ACETAMINOPHEN 325 MG PO TABS: 325.0000 mg | ORAL_TABLET | ORAL | Status: DC | PRN

## 2020-01-21 MED ORDER — METOCLOPRAMIDE HCL 5 MG PO TABS: 5.0000 mg | ORAL_TABLET | Freq: Three times a day (TID) | ORAL | Status: DC | PRN

## 2020-01-21 MED ORDER — PHENYLEPHRINE HCL-NACL 10-0.9 MG/250ML-% IV SOLN
INTRAVENOUS | Status: DC | PRN
  Administered 2020-01-21: 20 ug/min via INTRAVENOUS

## 2020-01-21 MED ORDER — SODIUM CHLORIDE 0.9 % IR SOLN
Status: DC | PRN
  Administered 2020-01-21: 3000 mL

## 2020-01-21 SURGICAL SUPPLY — 78 items
BAG DECANTER FOR FLEXI CONT (MISCELLANEOUS) ×2 IMPLANT
BANDAGE ESMARK 6X9 LF (GAUZE/BANDAGES/DRESSINGS) ×1 IMPLANT
BLADE SAG 18X100X1.27 (BLADE) ×2 IMPLANT
BNDG COHESIVE 6X5 TAN STRL LF (GAUZE/BANDAGES/DRESSINGS) ×2 IMPLANT
BNDG ELASTIC 6X10 VLCR STRL LF (GAUZE/BANDAGES/DRESSINGS) ×2 IMPLANT
BNDG ELASTIC 6X15 VLCR STRL LF (GAUZE/BANDAGES/DRESSINGS) ×2 IMPLANT
BNDG ESMARK 6X9 LF (GAUZE/BANDAGES/DRESSINGS) ×2
BOWL SMART MIX CTS (DISPOSABLE) IMPLANT
CNTNR URN SCR LID CUP LEK RST (MISCELLANEOUS) ×1 IMPLANT
CONT SPEC 4OZ STRL OR WHT (MISCELLANEOUS) ×1
COVER SURGICAL LIGHT HANDLE (MISCELLANEOUS) ×2 IMPLANT
COVER WAND RF STERILE (DRAPES) IMPLANT
CUFF TOURN SGL QUICK 34 (TOURNIQUET CUFF) ×1
CUFF TRNQT CYL 34X4.125X (TOURNIQUET CUFF) ×1 IMPLANT
DECANTER SPIKE VIAL GLASS SM (MISCELLANEOUS) ×2 IMPLANT
DRAPE INCISE IOBAN 66X45 STRL (DRAPES) ×2 IMPLANT
DRAPE ORTHO SPLIT 77X108 STRL (DRAPES) ×3
DRAPE SURG ORHT 6 SPLT 77X108 (DRAPES) ×3 IMPLANT
DRAPE U-SHAPE 47X51 STRL (DRAPES) ×2 IMPLANT
DRESSING AQUACEL AG SP 3.5X10 (GAUZE/BANDAGES/DRESSINGS) ×1 IMPLANT
DRSG AQUACEL AG ADV 3.5X14 (GAUZE/BANDAGES/DRESSINGS) ×2 IMPLANT
DRSG AQUACEL AG SP 3.5X10 (GAUZE/BANDAGES/DRESSINGS) ×2
DRSG XEROFORM 1X8 (GAUZE/BANDAGES/DRESSINGS) ×2 IMPLANT
DURAPREP 26ML APPLICATOR (WOUND CARE) ×4 IMPLANT
ELECT CAUTERY BLADE 6.4 (BLADE) ×2 IMPLANT
ELECT REM PT RETURN 9FT ADLT (ELECTROSURGICAL) ×2
ELECTRODE REM PT RTRN 9FT ADLT (ELECTROSURGICAL) ×1 IMPLANT
FEMORAL RETAINING CRUC KNEE #4 (Knees) ×2 IMPLANT
GAUZE SPONGE 4X4 12PLY STRL (GAUZE/BANDAGES/DRESSINGS) ×2 IMPLANT
GLOVE BIOGEL PI IND STRL 7.0 (GLOVE) ×1 IMPLANT
GLOVE BIOGEL PI IND STRL 8 (GLOVE) ×1 IMPLANT
GLOVE BIOGEL PI INDICATOR 7.0 (GLOVE) ×1
GLOVE BIOGEL PI INDICATOR 8 (GLOVE) ×1
GLOVE ECLIPSE 7.0 STRL STRAW (GLOVE) ×2 IMPLANT
GLOVE ECLIPSE 8.0 STRL XLNG CF (GLOVE) ×2 IMPLANT
GOWN STRL REUS W/ TWL LRG LVL3 (GOWN DISPOSABLE) ×3 IMPLANT
GOWN STRL REUS W/TWL LRG LVL3 (GOWN DISPOSABLE) ×3
HANDPIECE INTERPULSE COAX TIP (DISPOSABLE) ×1
HOOD PEEL AWAY FLYTE STAYCOOL (MISCELLANEOUS) ×8 IMPLANT
IMMOBILIZER KNEE 20 (SOFTGOODS)
IMMOBILIZER KNEE 20 THIGH 36 (SOFTGOODS) IMPLANT
IMMOBILIZER KNEE 22 UNIV (SOFTGOODS) ×2 IMPLANT
IMMOBILIZER KNEE 24 THIGH 36 (MISCELLANEOUS) IMPLANT
IMMOBILIZER KNEE 24 UNIV (MISCELLANEOUS)
INSERT TIB BEARING X3 9 SZ5 (Insert) ×2 IMPLANT
KIT BASIN OR (CUSTOM PROCEDURE TRAY) ×2 IMPLANT
KIT TURNOVER KIT B (KITS) ×2 IMPLANT
KNEE PATELLA ASYMMETRIC 10X35 (Knees) ×2 IMPLANT
KNEE TIBIAL COMPONENT SZ5 (Knees) ×2 IMPLANT
MANIFOLD NEPTUNE II (INSTRUMENTS) ×2 IMPLANT
NEEDLE 22X1 1/2 (OR ONLY) (NEEDLE) ×4 IMPLANT
NEEDLE SPNL 18GX3.5 QUINCKE PK (NEEDLE) ×2 IMPLANT
NS IRRIG 1000ML POUR BTL (IV SOLUTION) ×4 IMPLANT
PACK TOTAL JOINT (CUSTOM PROCEDURE TRAY) ×2 IMPLANT
PAD ABD 8X10 STRL (GAUZE/BANDAGES/DRESSINGS) ×2 IMPLANT
PAD ARMBOARD 7.5X6 YLW CONV (MISCELLANEOUS) ×4 IMPLANT
PAD CAST 4YDX4 CTTN HI CHSV (CAST SUPPLIES) ×1 IMPLANT
PAD COLD SHLDR WRAP-ON (PAD) ×2 IMPLANT
PADDING CAST COTTON 4X4 STRL (CAST SUPPLIES) ×1
PADDING CAST COTTON 6X4 STRL (CAST SUPPLIES) ×2 IMPLANT
PIN FLUTED HEDLESS FIX 3.5X1/8 (PIN) ×2 IMPLANT
SET HNDPC FAN SPRY TIP SCT (DISPOSABLE) ×1 IMPLANT
STRIP CLOSURE SKIN 1/2X4 (GAUZE/BANDAGES/DRESSINGS) ×2 IMPLANT
SUCTION FRAZIER HANDLE 10FR (MISCELLANEOUS) ×1
SUCTION TUBE FRAZIER 10FR DISP (MISCELLANEOUS) ×1 IMPLANT
SUT MNCRL AB 3-0 PS2 18 (SUTURE) ×2 IMPLANT
SUT VIC AB 0 CT1 27 (SUTURE) ×3
SUT VIC AB 0 CT1 27XBRD ANBCTR (SUTURE) ×3 IMPLANT
SUT VIC AB 1 CT1 27 (SUTURE) ×4
SUT VIC AB 1 CT1 27XBRD ANBCTR (SUTURE) ×4 IMPLANT
SUT VIC AB 2-0 CT1 27 (SUTURE) ×4
SUT VIC AB 2-0 CT1 TAPERPNT 27 (SUTURE) ×4 IMPLANT
SYR 30ML LL (SYRINGE) ×6 IMPLANT
SYR TB 1ML LUER SLIP (SYRINGE) ×2 IMPLANT
TOWEL GREEN STERILE (TOWEL DISPOSABLE) ×4 IMPLANT
TOWEL GREEN STERILE FF (TOWEL DISPOSABLE) ×4 IMPLANT
TRAY CATH 16FR W/PLASTIC CATH (SET/KITS/TRAYS/PACK) IMPLANT
WATER STERILE IRR 1000ML POUR (IV SOLUTION) IMPLANT

## 2020-01-21 NOTE — Addendum Note (Signed)
Addendum  created 01/21/20 1543 by Janeece Riggers, MD   Order list changed

## 2020-01-21 NOTE — Progress Notes (Signed)
Orthopedic Tech Progress Note Patient Details:  Patrick [name] [birth year] [id number]  CPM Left Knee CPM Left Knee: On Left Knee Flexion (Degrees): 10 Left Knee Extension (Degrees): 40    Ortho Devices Type of Ortho Device: Bone foam zero knee Ortho Device/Splint Location: LLE Ortho Device/Splint Interventions: Ordered       [name] [name] 01/21/2020, 5:16 PM

## 2020-01-21 NOTE — Anesthesia Procedure Notes (Signed)
Spinal  Patient location during procedure: OR Start time: 01/21/2020 12:23 PM End time: 01/21/2020 12:27 PM Staffing Anesthesiologist: Janeece Riggers, MD Preanesthetic Checklist Completed: patient identified, IV checked, site marked, risks and benefits discussed, surgical consent, monitors and equipment checked, pre-op evaluation and timeout performed Spinal Block Patient position: sitting Prep: DuraPrep Patient monitoring: heart rate, cardiac monitor, continuous pulse ox and blood pressure Approach: midline Location: L3-4 Injection technique: single-shot Needle Needle type: Sprotte  Needle gauge: 24 G Needle length: 9 cm Assessment Sensory level: T4

## 2020-01-21 NOTE — Transfer of Care (Signed)
Immediate Anesthesia Transfer of Care Note  Patient: Patrick [name]  Procedure(s) Performed: LEFT TOTAL KNEE ARTHROPLASTY (Left Knee)  Patient Location: PACU  Anesthesia Type:MAC combined with regional for post-op pain  Level of Consciousness: awake, alert  and oriented  Airway & Oxygen Therapy: Patient Spontanous Breathing and Patient connected to face mask oxygen  Post-op Assessment: Report given to RN and Post -op Vital signs reviewed and stable  Post vital signs: Reviewed and stable  Last Vitals:  Vitals Value Taken Time  BP 129/76 01/21/20 1506  Temp    Pulse 84 01/21/20 1507  Resp 18 01/21/20 1507  SpO2 98 % 01/21/20 1507  Vitals shown include unvalidated device data.  Last Pain:  Vitals:   01/21/20 1200  TempSrc:   PainSc: 0-No pain         Complications: No complications documented.

## 2020-01-21 NOTE — Progress Notes (Signed)
Patient arrived to unit a/o/vx4, oriented to room. Patient denies pain at this time. Will continue to monitor.

## 2020-01-21 NOTE — Anesthesia Postprocedure Evaluation (Signed)
Anesthesia Post Note  Patient: Patrick [name]  Procedure(s) Performed: LEFT TOTAL KNEE ARTHROPLASTY (Left Knee)     Patient location during evaluation: PACU Anesthesia Type: MAC Level of consciousness: awake and alert Pain management: pain level controlled Vital Signs Assessment: post-procedure vital signs reviewed and stable Respiratory status: spontaneous breathing, nonlabored ventilation, respiratory function stable and patient connected to nasal cannula oxygen Cardiovascular status: stable and blood pressure returned to baseline Postop Assessment: no apparent nausea or vomiting Anesthetic complications: no   No complications documented.  Last Vitals:  Vitals:   01/21/20 1156 01/21/20 1506  BP: (!) 143/78 129/76  Pulse: 79 85  Resp: 15 11  Temp:    SpO2: 98% 97%    Last Pain:  Vitals:   01/21/20 1200  TempSrc:   PainSc: 0-No pain                 [name]

## 2020-01-21 NOTE — Anesthesia Procedure Notes (Signed)
Anesthesia Regional Block: Adductor canal block   Pre-Anesthetic Checklist: ,, timeout performed, Correct Patient, Correct Site, Correct Laterality, Correct Procedure, Correct Position, site marked, Risks and benefits discussed,  Surgical consent,  Pre-op evaluation,  At surgeon's request and post-op pain management  Laterality: Left  Prep: chloraprep       Needles:  Injection technique: Single-shot  Needle Type: Echogenic Stimulator Needle     Needle Length: 5cm  Needle Gauge: 22     Additional Needles:   Procedures:, nerve stimulator,,, ultrasound used (permanent image in chart),,,,  Narrative:  Start time: 01/21/2020 11:45 AM End time: 01/21/2020 11:50 AM Injection made incrementally with aspirations every 5 mL.  Performed by: Personally  Anesthesiologist: Janeece Riggers, MD  Additional Notes: Functioning IV was confirmed and monitors were applied.  A 68mm 22ga Arrow echogenic stimulator needle was used. Sterile prep and drape,hand hygiene and sterile gloves were used. Ultrasound guidance: relevant anatomy identified, needle position confirmed, local anesthetic spread visualized around nerve(s)., vascular puncture avoided.  Image printed for medical record. Negative aspiration and negative test dose prior to incremental administration of local anesthetic. The patient tolerated the procedure well.

## 2020-01-21 NOTE — Anesthesia Preprocedure Evaluation (Signed)
Anesthesia Evaluation  Patient identified by MRN, date of birth, ID band Patient awake    Reviewed: Allergy & Precautions, NPO status , Patient's Chart, lab work & pertinent test results  History of Anesthesia Complications Negative for: history of anesthetic complications  Airway Mallampati: II  TM Distance: >3 FB Neck ROM: Full    Dental  (+)    Pulmonary neg pulmonary ROS, former smoker,    Pulmonary exam normal        Cardiovascular hypertension, Pt. on medications Normal cardiovascular exam+ dysrhythmias      Neuro/Psych negative neurological ROS  negative psych ROS   GI/Hepatic negative GI ROS, Neg liver ROS,   Endo/Other  negative endocrine ROS  Renal/GU negative Renal ROS  negative genitourinary   Musculoskeletal  (+) Arthritis , Osteoarthritis,    Abdominal   Peds  Hematology negative hematology ROS (+)   Anesthesia Other Findings Positive for COVID-19 on 03/19/19  Reproductive/Obstetrics                             Anesthesia Physical  Anesthesia Plan  ASA: II  Anesthesia Plan: MAC and Spinal   Post-op Pain Management:  Regional for Post-op pain   Induction: Intravenous  PONV Risk Score and Plan: 2 and Ondansetron, Dexamethasone, Treatment may vary due to age or medical condition and Midazolam  Airway Management Planned: Nasal Cannula, Natural Airway, Simple Face Mask and Mask  Additional Equipment: None  Intra-op Plan:   Post-operative Plan: Extubation in OR  Informed Consent: I have reviewed the patients History and Physical, chart, labs and discussed the procedure including the risks, benefits and alternatives for the proposed anesthesia with the patient or authorized representative who has indicated his/her understanding and acceptance.     Dental advisory given  Plan Discussed with: Anesthesiologist and CRNA  Anesthesia Plan Comments: (  )         Anesthesia Quick Evaluation

## 2020-01-21 NOTE — Anesthesia Procedure Notes (Signed)
Procedure Name: MAC Date/Time: 01/21/2020 12:51 PM Performed by: Dorthea Cove, CRNA Pre-anesthesia Checklist: Patient identified, Emergency Drugs available, Suction available, Patient being monitored and Timeout performed Patient Re-evaluated:Patient Re-evaluated prior to induction Oxygen Delivery Method: Simple face mask Preoxygenation: Pre-oxygenation with 100% oxygen Induction Type: IV induction Airway Equipment and Method: Oral airway Dental Injury: Teeth and Oropharynx as per pre-operative assessment

## 2020-01-21 NOTE — Brief Op Note (Signed)
° °  01/21/2020  3:10 PM  PATIENT:  Patrick [name]  59 y.o. male  PRE-OPERATIVE DIAGNOSIS:  LEFT KNEE OSTEOARTHRITIS  POST-OPERATIVE DIAGNOSIS:  LEFT KNEE OSTEOARTHRITIS  PROCEDURE:  Procedure(s): LEFT TOTAL KNEE ARTHROPLASTY  SURGEON:  Surgeon(s): Meredith Pel, MD  ASSISTANT: magnant pa  ANESTHESIA:   spinal  EBL: 75 ml    Total I/O In: 100 [IV Piggyback:100] Out: 475 [Urine:400; Blood:75]  BLOOD ADMINISTERED: none  DRAINS: none   LOCAL MEDICATIONS USED: Marcaine morphine clonidine Exparel  SPECIMEN:  No Specimen  COUNTS:  YES  TOURNIQUET:   Total Tourniquet Time Documented: Thigh (Left) - 62 minutes Total: Thigh (Left) - 62 minutes   DICTATION: .Other Dictation: done  PLAN OF CARE: Admit for overnight observation  PATIENT DISPOSITION:  PACU - hemodynamically stable

## 2020-01-21 NOTE — H&P (Signed)
TOTAL KNEE ADMISSION H&P  Patient is being admitted for left total knee arthroplasty.  Subjective:  Chief Complaint:left knee pain.  HPI: Patrick [name], 59 y.o. male, has a history of pain and functional disability in the left knee due to arthritis and has failed non-surgical conservative treatments for greater than 12 weeks to includeNSAID's and/or analgesics, corticosteriod injections, flexibility and strengthening excercises and activity modification.  Onset of symptoms was abrupt, starting 2 years ago with gradually worsening course since that time. The patient noted prior procedures on the knee to include  arthroscopy and menisectomy on the left knee(s).  Patient currently rates pain in the left knee(s) at 8 out of 10 with activity. Patient has night pain, worsening of pain with activity and weight bearing, pain that interferes with activities of daily living, pain with passive range of motion, crepitus and joint swelling.  Patient has evidence of subchondral sclerosis and joint space narrowing by imaging studies. This patient has had Long history of pain since ACL injury many years ago. The pain has been gradual in onset but worsening relatively acutely over the past 2 years. Not a candidate for partial knee replacement due to ACL deficiency. There is no active infection.  Patient Active Problem List   Diagnosis Date Noted  . Recurrent left knee instability 09/01/2019  . Baker's cyst of knee, left 08/12/2019  . 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  . Acute bursitis of right shoulder 02/02/2019  . Coughing 11/06/2018  . Penicillin allergy 11/06/2018  . Hip flexor tightness 09/29/2018  . 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  . Hyperglycemia 08/02/2018  . HLD (hyperlipidemia) 08/02/2018  . Vitamin D deficiency 08/02/2018  . Preventative health care 07/30/2018  . HTN (hypertension) 07/30/2018  . Other allergic rhinitis 02/21/2015   Past Medical History:  Diagnosis Date  .  Allergy   . Arthritis    left knee  . Dysrhythmia    " [name]"  . Heart murmur   . History of hiatal hernia    4 years ago per patient- no surgery needed  . History of kidney stones   . Hypertension     Past Surgical History:  Procedure Laterality Date  . APPENDECTOMY    . Arthroscopic knee surgeries on both knees    . CHOLECYSTECTOMY    . NASAL SEPTOPLASTY W/ TURBINOPLASTY Bilateral 04/13/2019   Procedure: NASAL SEPTOPLASTY WITH BILATERAL TURBINATE REDUCTION;  Surgeon: Leta Baptist, MD;  Location: [facility];  Service: ENT;  Laterality: Bilateral;  . VASECTOMY      Current Facility-Administered Medications  Medication Dose Route Frequency Provider Last Rate Last Admin  . fentaNYL (SUBLIMAZE) 100 MCG/2ML injection           . lactated ringers infusion   Intravenous Continuous Lillia Abed, MD      . midazolam (VERSED) 2 MG/2ML injection           . povidone-iodine (BETADINE) 7.5 % scrub   Topical Once Magnant, Charles L, PA-C      . povidone-iodine 10 % swab 2 application  2 application Topical Once Magnant, Charles L, PA-C      . tranexamic acid (CYKLOKAPRON) 2,000 mg in sodium chloride 0.9 % 50 mL Topical Application  2,725 mg Topical To OR Meredith Pel, MD      . tranexamic acid (CYKLOKAPRON) IVPB 1,000 mg  1,000 mg Intravenous To OR Magnant, Charles L, PA-C      . vancomycin (  VANCOREADY) IVPB 1500 mg/300 mL  1,500 mg Intravenous On Call to [place], Tonna Corner, MD       Allergies  Allergen Reactions  . Penicillins Hives    Rash around neck  Has patient had a PCN reaction causing immediate rash, facial/tongue/throat swelling, SOB or lightheadedness with hypotension: Yes Has patient had a PCN reaction causing severe rash involving mucus membranes or skin necrosis: No Has patient had a PCN reaction that required hospitalization: No Has patient had a PCN reaction occurring within the last 10 years: No If all of the above  answers are "NO", then may proceed with Cephalosporin use.    Social History   Tobacco Use  . Smoking status: Former Smoker    Packs/day: 0.00    Types: Cigars  . Smokeless tobacco: Never Used  . Tobacco comment: one cigar every few years  Substance Use Topics  . Alcohol use: Yes    Alcohol/week: 5.0 standard drinks    Types: 5 Standard drinks or equivalent per week    Family History  Adopted: Yes     Review of Systems  Musculoskeletal: Positive for arthralgias.  All other systems reviewed and are negative.   Objective:  Physical Exam Vitals reviewed.  HENT:     Head: Normocephalic.     Mouth/Throat:     Mouth: Mucous membranes are moist.  Eyes:     Pupils: Pupils are equal, round, and reactive to light.  Cardiovascular:     Rate and Rhythm: Normal rate.     Pulses: Normal pulses.  Pulmonary:     Effort: Pulmonary effort is normal.  Abdominal:     General: Abdomen is flat.  Musculoskeletal:     Cervical back: Normal range of motion.  Skin:    General: Skin is warm.     Capillary Refill: Capillary refill takes less than 2 seconds.  Neurological:     General: No focal deficit present.     Mental Status: He is alert.  Psychiatric:        Mood and Affect: Mood normal.    Examination of the left knee demonstrates intact skin. Range of motion is about 8-1 15. Collaterals are stable. ACL laxity is present. Pedal pulses present. Ankle dorsiflexion intact. Vital signs in last 24 hours: Temp:  [98.4 F (36.9 C)] 98.4 F (36.9 C) (11/18 1035) Pulse Rate:  [79] 79 (11/18 1035) Resp:  [19] 19 (11/18 1035) BP: (164)/(84) 164/84 (11/18 1035) SpO2:  [96 %] 96 % (11/18 1035) Weight:  [99.8 kg] 99.8 kg (11/18 1035)  Labs:   Estimated body mass index is 31.57 kg/m as calculated from the following:   Height as of this encounter: 5\' 10"  (1.778 m).   Weight as of this encounter: 99.8 kg.   Imaging Review Plain radiographs demonstrate moderate degenerative joint  disease of the left knee(s). The overall alignment isneutral. The bone quality appears to be excellent for age and reported activity level.      Assessment/Plan:  End stage arthritis, left knee   The patient history, physical examination, clinical judgment of the provider and imaging studies are consistent with end stage degenerative joint disease of the left knee(s) and total knee arthroplasty is deemed medically necessary. The treatment options including medical management, injection therapy arthroscopy and arthroplasty were discussed at length. The risks and benefits of total knee arthroplasty were presented and reviewed. The risks due to aseptic loosening, infection, stiffness, patella tracking problems, thromboembolic complications and other imponderables were discussed.  The patient acknowledged the explanation, agreed to proceed with the plan and consent was signed. Patient is being admitted for inpatient treatment for surgery, pain control, PT, OT, prophylactic antibiotics, VTE prophylaxis, progressive ambulation and ADL's and discharge planning. The patient is planning to be discharged home with home health services     Patient's anticipated LOS is less than 2 midnights, meeting these requirements: - Younger than 40 - Lives within 1 hour of care - Has a competent adult at home to recover with post-op recover - NO history of  - Chronic pain requiring opiods  - Diabetes  - Coronary Artery Disease  - Heart failure  - Heart attack  - Stroke  - DVT/VTE  - Cardiac arrhythmia  - Respiratory Failure/COPD  - Renal failure  - Anemia  - Advanced Liver disease

## 2020-01-21 NOTE — Progress Notes (Signed)
cpm off tolerated for 4hrs- bone foam placed along with immobilizer and ice. Bladderscan showed 856ml in bladder-pt experiencing discomfort- in/out cath performed netted 937ml.  Informed pt will be monitoring for him to urinate on his own in next 6hrs- and if need be to scan again- and if necessary to cath again.

## 2020-01-22 ENCOUNTER — Encounter (HOSPITAL_COMMUNITY): Payer: Self-pay | Admitting: Orthopedic Surgery

## 2020-01-22 DIAGNOSIS — M1712 Unilateral primary osteoarthritis, left knee: Secondary | ICD-10-CM | POA: Diagnosis not present

## 2020-01-22 MED ORDER — OXYCODONE HCL 5 MG PO TABS
5.0000 mg | ORAL_TABLET | ORAL | 0 refills | Status: DC | PRN
Start: 2020-01-22 — End: 2020-02-01

## 2020-01-22 MED ORDER — METHOCARBAMOL 500 MG PO TABS
500.0000 mg | ORAL_TABLET | Freq: Four times a day (QID) | ORAL | 0 refills | Status: DC | PRN
Start: 2020-01-22 — End: 2020-02-04

## 2020-01-22 MED ORDER — CELECOXIB 200 MG PO CAPS
200.0000 mg | ORAL_CAPSULE | Freq: Two times a day (BID) | ORAL | 0 refills | Status: DC
Start: 2020-01-22 — End: 2020-02-04

## 2020-01-22 NOTE — Progress Notes (Signed)
cpm on set 50 degree flexion

## 2020-01-22 NOTE — Progress Notes (Signed)
Tolerated bone foam >1hr

## 2020-01-22 NOTE — Op Note (Signed)
NAMEKUNAAL, WALKINS MEDICAL RECORD IN:[id number] ACCOUNT [id number] DATE OF BIRTH:05-Sep-1960 FACILITY: [facility] LOCATION: [facility] PHYSICIAN:Jonnette Nuon Randel Pigg, MD  OPERATIVE REPORT  DATE OF PROCEDURE:  01/21/2020  PREOPERATIVE DIAGNOSIS:  Left knee arthritis.  POSTOPERATIVE DIAGNOSIS:  Left knee arthritis.  PROCEDURE:  Left total knee replacement using Stryker Triathlon press fit total knee size 4 femur, posterior cruciate retaining size 5 tibia, 9 mm polyethylene deep dish insert with 35 mm 3-peg press-fit patella.  SURGEON:  Meredith Pel, MD  ASSISTANT:  Annie Main PA.  INDICATIONS:  This is a patient with end-stage left knee arthritis, who presents for operative management after explanation of risks and benefits.  PROCEDURE IN DETAIL:  The patient was brought to the operating room where spinal anesthetic was induced.  Preoperative antibiotics administered.  Timeout was called.  Left leg prescrubbed with alcohol and Betadine, allowed to air dry.  Prep with DuraPrep  solution and draped in sterile manner.  Ioban used to cover the operative field.  The leg was elevated, exsanguinated with Esmarch wrap.  Timeout was called.  Anterior approach to knee was made.  Median parapatellar arthrotomy was made.  The IrriSept  solution utilized after the incision as well as after the arthrotomy.  Medial parapatellar arthrotomy marked with #1 Vicryl suture.  Lateral patellofemoral ligament released.  Fat pad partially excised.  Soft tissue removed from the anterior distal  femur.  Minimal soft tissue dissection performed on the medial soft tissue sleeve.  The patella was everted, knee flexed.  ACL released.  Anterior horn of the lateral meniscus released.  Intramedullary alignment used to make a cut 9 mm off the least  affected lateral tibial plateau.  Posterior neurovascular structures and collateral ligaments protected.  Next, bone quality was excellent.  Femoral cut was then made 5 degrees valgus, 8  mm off the distal femur.  Anterior, posterior chamfer cuts were  made.  Femur sized to a size 4, tibia sized to a size 5.  Flexion and extension gaps were symmetric at 0 and 90 degrees using a 9 mm spacer.  Trial components placed.  Patellar cut from 26-15 patellar component placed.  With the trial components in  position including a 9 mm spacer the patient had full extension, full flexion, no liftoff, excellent patellar tracking using no thumbs technique.  Final preparation was made on the femur and tibia, which was keel punched and then prepared.  Thorough  irrigation performed with 3 liters of pulsatile solution followed by tranexamic acid sponge and IrriSept solution for 3 minutes.  Solution of Marcaine, Exparel and saline used to inject the capsule medially and laterally.  True components then tapped  into position with excellent press fit obtained.  Same stability parameters were maintained with excellent stability to varus and valgus stress at 0, 30 and 90 degrees.  Tourniquet released.  Bleeding points encountered, controlled with electrocautery.   The IrriSept solution was again utilized followed by vancomycin powder.  The arthrotomy closed over a bolster using #1 Vicryl suture followed by interrupted inverted 0 Vicryl suture, 2-0 Vicryl suture and a 3-0 Monocryl.  Solution of Marcaine, morphine,  clonidine injected into the knee for postop pain relief as well.  Steri-Strips, Aquacel dressing applied along with a bulky dressing and knee immobilizer.  [name]'s assistance was required for opening, closing, mobilization of tissue and retraction.  His  assistance was medical necessity.  HN/NUANCE  D:01/21/2020 T:01/22/2020 JOB:[id number]

## 2020-01-22 NOTE — Progress Notes (Signed)
Able to void on his own 498ml

## 2020-01-22 NOTE — Evaluation (Signed)
Physical Therapy Evaluation & Discharge Patient Details Name: Patrick [name] MRN: [id number] DOB: 08/14/1960 Today's Date: 01/22/2020   History of Present Illness  Patient is a 59 y/o male with history of arthritis and ACL injuries to L knee. Patient s/p L TKA on 01/21/2020. PMH includes HTN, HLD, hyperglycemia, arthritis, heart murmur.   Clinical Impression  PTA, patient lives with wife and independent with all mobility. Patient is modI for all mobility. Patient ambulated 25' with RW modI, cues initially for L heel strike. Patient negotiated 4 stairs with L handrail and step to pattern modI. Patient is limited by decreased L knee ROM and impaired functional mobility, however does not require skilled PT services in the acute setting at this time. Patient will benefit from HHPT initially for safe navigation of home and strengthening. PT will sign off. Patient is safe to return home from PT perspective.     Follow Up Recommendations Follow surgeon's recommendation for DC plan and follow-up therapies    Equipment Recommendations  None recommended by PT    Recommendations for Other Services       Precautions / Restrictions Precautions Precautions: Fall Restrictions Weight Bearing Restrictions: Yes LLE Weight Bearing: Weight bearing as tolerated      Mobility  Bed Mobility Overal bed mobility: Modified Independent                  Transfers Overall transfer level: Modified independent Equipment used: Rolling Jeramyah Goodpasture (2 wheeled)                Ambulation/Gait Ambulation/Gait assistance: Modified independent (Device/Increase time) Gait Distance (Feet): 450 Feet Assistive device: Rolling Keenon Leitzel (2 wheeled) Gait Pattern/deviations: Step-through pattern     General Gait Details: modI with ambulation with RW, cues initially for heel strike on L   Stairs Stairs: Yes Stairs assistance: Modified independent (Device/Increase time) Stair Management: One rail  Left;Forwards;Step to pattern Number of Stairs: 4    Wheelchair Mobility    Modified Rankin (Stroke Patients Only)       Balance Overall balance assessment: No apparent balance deficits (not formally assessed)                                           Pertinent Vitals/Pain Pain Assessment: 0-10 Pain Score: 3  Pain Location: L knee Pain Descriptors / Indicators: Discomfort;Sore Pain Intervention(s): Monitored during session;Ice applied    Home Living Family/patient expects to be discharged to:: Private residence Living Arrangements: Spouse/significant other Available Help at Discharge: Family;Available 24 hours/day Type of Home: House Home Access: Stairs to enter Entrance Stairs-Rails: Left Entrance Stairs-Number of Steps: 4 (5 stairs on the front with rails on B sides, unable to reach) Home Layout: Two level;Able to live on main level with bedroom/bathroom (office is upstairs) Home Equipment: Shower seat - built in;Daishaun Ayre - 2 wheels;Cane - single point      Prior Function Level of Independence: Independent         Comments: retired     Journalist, newspaper        Extremity/Trunk Assessment   Upper Extremity Assessment Upper Extremity Assessment: Overall WFL for tasks assessed    Lower Extremity Assessment Lower Extremity Assessment: Overall WFL for tasks assessed       Communication   Communication: No difficulties  Cognition Arousal/Alertness: Awake/alert Behavior During Therapy: WFL for tasks assessed/performed Overall Cognitive Status: Within Functional Limits for  tasks assessed                                        General Comments      Exercises General Exercises - Lower Extremity Ankle Circles/Pumps: Left;5 reps Quad Sets: Left;5 reps Long Arc Quad: Left;5 reps Heel Slides: Left;5 reps Straight Leg Raises: Left;5 reps Hip Flexion/Marching: Left;5 reps;Seated   Assessment/Plan    PT Assessment Patent  does not need any further PT services  PT Problem List Decreased strength;Decreased range of motion;Decreased activity tolerance;Decreased mobility;Decreased balance       PT Treatment Interventions      PT Goals (Current goals can be found in the Care Plan section)  Acute Rehab PT Goals Patient Stated Goal: to go home and travel PT Goal Formulation: With patient Potential to Achieve Goals: Good    Frequency     Barriers to discharge        Co-evaluation               AM-PAC PT "6 Clicks" Mobility  Outcome Measure Help needed turning from your back to your side while in a flat bed without using bedrails?: None Help needed moving from lying on your back to sitting on the side of a flat bed without using bedrails?: None Help needed moving to and from a bed to a chair (including a wheelchair)?: None Help needed standing up from a chair using your arms (e.g., wheelchair or bedside chair)?: None Help needed to walk in hospital room?: None Help needed climbing 3-5 steps with a railing? : None 6 Click Score: 24    End of Session Equipment Utilized During Treatment: Gait belt Activity Tolerance: Patient tolerated treatment well Patient left: in chair;with call bell/phone within reach Nurse Communication: Mobility status PT Visit Diagnosis: Muscle weakness (generalized) (M62.81);Difficulty in walking, not elsewhere classified (R26.2)    Time: 1975-8832 PT Time Calculation (min) (ACUTE ONLY): 44 min   Charges:   PT Evaluation $PT Eval Low Complexity: 1 Low PT Treatments $Therapeutic Exercise: 8-22 mins $Therapeutic Activity: 8-22 mins        Perrin Maltese, PT, DPT Acute Rehabilitation Services Pager [phone] Office [phone]   Melene Plan Allred 01/22/2020, 8:56 AM

## 2020-01-22 NOTE — Progress Notes (Signed)
  Subjective: Patient stable.  Pain controlled.  Can easily do straight leg raise   Objective: Vital signs in last 24 hours: Temp:  [97.2 F (36.2 C)-98.7 F (37.1 C)] 98 F (36.7 C) (11/19 0318) Pulse Rate:  [72-93] 77 (11/19 0318) Resp:  [8-22] 19 (11/19 0318) BP: (117-164)/(67-95) 117/79 (11/19 0318) SpO2:  [93 %-100 %] 94 % (11/19 0318) Weight:  [99.8 kg] 99.8 kg (11/18 1035)  Intake/Output from previous day: 11/18 0701 - 11/19 0700 In: 100 [IV Piggyback:100] Out: 1959 [Urine:1500; Blood:75] Intake/Output this shift: No intake/output data recorded.  Exam:  Intact pulses distally Dorsiflexion/Plantar flexion intact  Labs: No results for input(s): HGB in the last 72 hours. No results for input(s): WBC, RBC, HCT, PLT in the last 72 hours. No results for input(s): NA, K, CL, CO2, BUN, CREATININE, GLUCOSE, CALCIUM in the last 72 hours. No results for input(s): LABPT, INR in the last 72 hours.  Assessment/Plan: Impression is patient is doing well following left total knee replacement.  Tolerating CPM machine well.  Discussed with him about doing CPM at home as well as using the ice machine and heat in order to obtain optimal range of motion.  Physical therapy this morning and then we will discharge home late morning early afternoon.   Patrick [name] 01/22/2020, 8:17 AM

## 2020-01-23 NOTE — Discharge Summary (Signed)
Physician Discharge Summary      Patient ID: Taite Schoeppner MRN: [id number] DOB/AGE: [birth year] 59 y.o.  Admit date: 01/21/2020 Discharge date: 01/23/2020  Admission Diagnoses:  Active Problems:   S/P total knee arthroplasty, left   Discharge Diagnoses:  Same  Surgeries: Procedure(s): LEFT TOTAL KNEE ARTHROPLASTY on 01/21/2020   Consultants:   Discharged Condition: Stable  Hospital Course: Mubashir Mallek is an 59 y.o. male who was admitted 01/21/2020 with a chief complaint of left knee pain, and found to have a diagnosis of left knee arthritis.  They were brought to the operating room on 01/21/2020 and underwent the above named procedures.  Patient tolerated procedure well.  Made excellent progress with physical therapy on postop day #1 and was discharged to home in good condition.  He will continue with CPM machine and work on range of motion exercises.  Follow-up with [place] in 2 weeks.  Antibiotics given:  Anti-infectives (From admission, onward)   Start     Dose/Rate Route Frequency Ordered Stop   01/21/20 2300  vancomycin (VANCOCIN) IVPB 1000 mg/200 mL premix        1,000 mg 200 mL/hr over 60 Minutes Intravenous Every 12 hours 01/21/20 1722 01/22/20 0028   01/21/20 1356  vancomycin (VANCOCIN) powder  Status:  Discontinued          As needed 01/21/20 1356 01/21/20 1502   01/21/20 1200  vancomycin (VANCOREADY) IVPB 1500 mg/300 mL        1,500 mg 150 mL/hr over 120 Minutes Intravenous On call to O.R. 01/20/20 1009 01/21/20 1311    .  Recent vital signs:  Vitals:   01/22/20 0318 01/22/20 0720  BP: 117/79 118/83  Pulse: 77 82  Resp: 19 18  Temp: 98 F (36.7 C) 98.1 F (36.7 C)  SpO2: 94% 96%    Recent laboratory studies:  Results for orders placed or performed during the hospital encounter of 01/19/20  SARS CORONAVIRUS 2 (TAT 6-24 HRS) Nasopharyngeal Nasopharyngeal Swab   Specimen: Nasopharyngeal Swab  Result Value Ref Range   SARS Coronavirus 2 NEGATIVE NEGATIVE     Discharge Medications:   Allergies as of 01/22/2020      Reactions   Penicillins Hives   Rash around neck  Has patient had a PCN reaction causing immediate rash, facial/tongue/throat swelling, SOB or lightheadedness with hypotension: Yes Has patient had a PCN reaction causing severe rash involving mucus membranes or skin necrosis: No Has patient had a PCN reaction that required hospitalization: No Has patient had a PCN reaction occurring within the last 10 years: No If all of the above answers are "NO", then may proceed with Cephalosporin use.      Medication List    STOP taking these medications   Krill Oil 500 MG Caps   meloxicam 15 MG tablet Commonly known as: MOBIC   nabumetone 750 MG tablet Commonly known as: Relafen     TAKE these medications   Aspir-Low 81 MG EC tablet Generic drug: aspirin Take 81 mg by mouth daily.   celecoxib 200 MG capsule Commonly known as: CELEBREX Take 1 capsule (200 mg total) by mouth 2 (two) times daily.   cetirizine 10 MG tablet Commonly known as: ZYRTEC Take 10 mg by mouth at bedtime.   cholecalciferol 25 MCG (1000 UNIT) tablet Commonly known as: VITAMIN D3 Take 1,000 Units by mouth daily.   Ginger Root 550 MG Caps Take 550 mg by mouth daily.   methocarbamol 500 MG tablet Commonly known as: ROBAXIN  Take 1 tablet (500 mg total) by mouth every 6 (six) hours as needed for muscle spasms.   multivitamin with minerals Tabs tablet Take 1 tablet by mouth daily. Men's Over 50   oxyCODONE 5 MG immediate release tablet Commonly known as: Oxy IR/ROXICODONE Take 1-2 tablets (5-10 mg total) by mouth every 4 (four) hours as needed for moderate pain (pain score 4-6).   rosuvastatin 10 MG tablet Commonly known as: Crestor Take 1 tablet (10 mg total) by mouth daily.   Tart Cherry Advanced Caps Take 1 capsule by mouth daily.   telmisartan 80 MG tablet Commonly known as: MICARDIS Take 1 tablet (80 mg total) by mouth daily.    Turmeric Curcumin 500 MG Caps Take 500 mg by mouth daily.       Diagnostic Studies: No results found.  Disposition: Discharge disposition: 01-Home or Self Care       Discharge Instructions    Call MD / Call 911   Complete by: As directed    If you experience chest pain or shortness of breath, CALL 911 and be transported to the hospital emergency room.  If you develope a fever above 101 F, pus (white drainage) or increased drainage or redness at the wound, or calf pain, call your surgeon's office.   Constipation Prevention   Complete by: As directed    Drink plenty of fluids.  Prune juice may be helpful.  You may use a stool softener, such as Colace (over the counter) 100 mg twice a day.  Use MiraLax (over the counter) for constipation as needed.   Diet - low sodium heart healthy   Complete by: As directed    Discharge instructions   Complete by: As directed    Use CPM machine 1/2-hour 3 times a day for the first week. Heat at the knee before using the CPM machine. Use the iceman machine for 30 minutes after CPM Use the blue foam cradle boot at least 30 minutes 3 times a day to achieve full straightening or extension of the left leg Weightbearing as tolerated with walker Okay to shower.  Dressing is waterproof   Increase activity slowly as tolerated   Complete by: As directed          Signed: Anderson Malta 01/23/2020, 10:51 AM

## 2020-01-29 ENCOUNTER — Other Ambulatory Visit: Payer: Self-pay | Admitting: Orthopedic Surgery

## 2020-02-01 ENCOUNTER — Other Ambulatory Visit: Payer: Self-pay | Admitting: Surgical

## 2020-02-01 ENCOUNTER — Telehealth: Payer: Self-pay | Admitting: Orthopedic Surgery

## 2020-02-01 MED ORDER — OXYCODONE HCL 5 MG PO TABS: 5.0000 mg | ORAL_TABLET | Freq: Four times a day (QID) | ORAL | 0 refills | Status: DC | PRN

## 2020-02-01 NOTE — Telephone Encounter (Signed)
Sent in RX, less frequency

## 2020-02-01 NOTE — Telephone Encounter (Signed)
I called patient and advised. 

## 2020-02-01 NOTE — Telephone Encounter (Signed)
Pt called stating he would like a refill of oxycodone if that's what Dr.Dean is still prescribing but if Dr.Dean was planning on sending a different rx pt would like to be notified   [phone]

## 2020-02-01 NOTE — Telephone Encounter (Signed)
Please advise. Thanks.  

## 2020-02-02 ENCOUNTER — Other Ambulatory Visit: Payer: Self-pay | Admitting: Orthopedic Surgery

## 2020-02-02 NOTE — Telephone Encounter (Signed)
Please advise 

## 2020-02-04 ENCOUNTER — Ambulatory Visit (INDEPENDENT_AMBULATORY_CARE_PROVIDER_SITE_OTHER): Payer: 59 | Admitting: Orthopedic Surgery

## 2020-02-04 ENCOUNTER — Ambulatory Visit (INDEPENDENT_AMBULATORY_CARE_PROVIDER_SITE_OTHER): Payer: 59

## 2020-02-04 ENCOUNTER — Other Ambulatory Visit: Payer: Self-pay

## 2020-02-04 DIAGNOSIS — Z96652 Presence of left artificial knee joint: Secondary | ICD-10-CM

## 2020-02-04 MED ORDER — METHOCARBAMOL 500 MG PO TABS
500.0000 mg | ORAL_TABLET | Freq: Four times a day (QID) | ORAL | 0 refills | Status: DC | PRN
Start: 2020-02-04 — End: 2020-02-19

## 2020-02-04 MED ORDER — CELECOXIB 200 MG PO CAPS
200.0000 mg | ORAL_CAPSULE | Freq: Two times a day (BID) | ORAL | 0 refills | Status: DC
Start: 2020-02-04 — End: 2020-02-19

## 2020-02-07 ENCOUNTER — Encounter: Payer: Self-pay | Admitting: Orthopedic Surgery

## 2020-02-07 NOTE — Progress Notes (Signed)
Post-Op Visit Note   Patient: Patrick [name]           Date of Birth: January 19, 1961           MRN: [id number] Visit Date: 02/04/2020 PCP: Biagio Borg, MD   Assessment & Plan:  Chief Complaint:  Chief Complaint  Patient presents with  . Left Knee - Routine Post Op   Visit Diagnoses:  1. Status post total left knee replacement     Plan: Patient is a 59 year old male who presents s/p left total knee arthroplasty on 01/21/2020.  He is doing well following procedure.  He used his CPM machine and was up to 130 degrees of flexion.  He has set the machine back now.  He finished home health physical therapy and he is now ambulating with a cane.  On exam he has 0 degrees extension and 110 degrees of flexion.  No calf tenderness.  Negative Homans' sign.  Incision healing well without any evidence of infection or dehiscence.  He takes oxycodone about twice per day for pain control.  Radiographs of the left knee show a total knee prosthesis in good position and alignment without any complicating features.  Refilled Robaxin and Celebrex.  Plan to start outpatient physical therapy upstairs 2 times per week for 6 weeks to work on range of motion and strength.  Follow-up in 4 weeks for clinical recheck.  Follow-Up Instructions: No follow-ups on file.   Orders:  Orders Placed This Encounter  Procedures  . XR Knee 1-2 Views Left  . Ambulatory referral to Physical Therapy   Meds ordered this encounter  Medications  . celecoxib (CELEBREX) 200 MG capsule    Sig: Take 1 capsule (200 mg total) by mouth 2 (two) times daily.    Dispense:  30 capsule    Refill:  0  . methocarbamol (ROBAXIN) 500 MG tablet    Sig: Take 1 tablet (500 mg total) by mouth every 6 (six) hours as needed for muscle spasms.    Dispense:  30 tablet    Refill:  0    Imaging: No results found.  PMFS History: Patient Active Problem List   Diagnosis Date Noted  . S/P total knee arthroplasty, 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  . Recurrent left  knee instability 09/01/2019  . Baker's cyst of knee, left 08/12/2019  . 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  . Acute bursitis of right shoulder 02/02/2019  . Coughing 11/06/2018  . Penicillin allergy 11/06/2018  . Hip flexor tightness 09/29/2018  . 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  . Hyperglycemia 08/02/2018  . HLD (hyperlipidemia) 08/02/2018  . Vitamin D deficiency 08/02/2018  . Preventative health care 07/30/2018  . HTN (hypertension) 07/30/2018  . Other allergic rhinitis 02/21/2015   Past Medical History:  Diagnosis Date  . Allergy   . Arthritis    left knee  . Dysrhythmia    " [name]"  . Heart murmur   . History of hiatal hernia    4 years ago per patient- no surgery needed  . History of kidney stones   . Hypertension     Family History  Adopted: Yes    Past Surgical History:  Procedure Laterality Date  . APPENDECTOMY    . Arthroscopic knee surgeries on both knees    . CHOLECYSTECTOMY    . NASAL SEPTOPLASTY W/ TURBINOPLASTY Bilateral 04/13/2019   Procedure: NASAL SEPTOPLASTY WITH BILATERAL TURBINATE REDUCTION;  Surgeon: Leta Baptist, MD;  Location: [facility]  [place] [facility];  Service: ENT;  Laterality: Bilateral;  . TOTAL KNEE ARTHROPLASTY Left 01/21/2020   Procedure: LEFT TOTAL KNEE ARTHROPLASTY;  Surgeon: Meredith Pel, MD;  Location: [place];  Service: Orthopedics;  Laterality: Left;  Marland Kitchen VASECTOMY     Social History   Occupational History  . Occupation: Print production planner, Financial controller, Semi-retired  Tobacco Use  . Smoking status: Former Smoker    Packs/day: 0.00    Types: Cigars  . Smokeless tobacco: Never Used  . Tobacco comment: one cigar every few years  Vaping Use  . Vaping Use: Never used  Substance and Sexual Activity  . Alcohol use: Yes    Alcohol/week: 5.0 standard drinks    Types: 5 Standard drinks or equivalent per week  . Drug use: No  . Sexual activity: Yes

## 2020-02-08 ENCOUNTER — Other Ambulatory Visit: Payer: Self-pay

## 2020-02-08 ENCOUNTER — Ambulatory Visit (INDEPENDENT_AMBULATORY_CARE_PROVIDER_SITE_OTHER): Payer: 59 | Admitting: Physical Therapy

## 2020-02-08 ENCOUNTER — Encounter: Payer: Self-pay | Admitting: Physical Therapy

## 2020-02-08 DIAGNOSIS — M25662 Stiffness of left knee, not elsewhere classified: Secondary | ICD-10-CM

## 2020-02-08 DIAGNOSIS — R600 Localized edema: Secondary | ICD-10-CM | POA: Diagnosis not present

## 2020-02-08 DIAGNOSIS — M79605 Pain in left leg: Secondary | ICD-10-CM

## 2020-02-08 DIAGNOSIS — R2689 Other abnormalities of gait and mobility: Secondary | ICD-10-CM

## 2020-02-08 DIAGNOSIS — M6281 Muscle weakness (generalized): Secondary | ICD-10-CM

## 2020-02-08 DIAGNOSIS — R2681 Unsteadiness on feet: Secondary | ICD-10-CM | POA: Diagnosis not present

## 2020-02-08 NOTE — Therapy (Signed)
[facility] Physical Therapy [address] [place], [place], [address] Phone: [phone]   Fax:  [phone]  Physical Therapy Evaluation  Patient Details  Name: Patrick [name] MRN: [id number] Date of Birth: 04-Sep-1960 Referring Provider (PT): Meredith Pel, MD   Encounter Date: 02/08/2020   PT End of Session - 02/08/20 1529    Visit Number 1    Number of Visits 16    Date for PT Re-Evaluation 03/18/20    Authorization Type [employer]    PT Start Time 1430    PT Stop Time 0321    PT Time Calculation (min) 46 min    Activity Tolerance Patient limited by pain;Patient tolerated treatment well    Behavior During Therapy [facility] for tasks assessed/performed           Past Medical History:  Diagnosis Date  . Allergy   . Arthritis    left knee  . Dysrhythmia    " [name]"  . Heart murmur   . History of hiatal hernia    4 years ago per patient- no surgery needed  . History of kidney stones   . Hypertension     Past Surgical History:  Procedure Laterality Date  . APPENDECTOMY    . Arthroscopic knee surgeries on both knees    . CHOLECYSTECTOMY    . NASAL SEPTOPLASTY W/ TURBINOPLASTY Bilateral 04/13/2019   Procedure: NASAL SEPTOPLASTY WITH BILATERAL TURBINATE REDUCTION;  Surgeon: Leta Baptist, MD;  Location: [place];  Service: ENT;  Laterality: Bilateral;  . TOTAL KNEE ARTHROPLASTY Left 01/21/2020   Procedure: LEFT TOTAL KNEE ARTHROPLASTY;  Surgeon: Meredith Pel, MD;  Location: [place];  Service: Orthopedics;  Laterality: Left;  Marland Kitchen VASECTOMY      There were no vitals filed for this visit.    Subjective Assessment - 02/08/20 1433    Subjective This 59yo male was referred to PT on 02/04/2020 by Meredith Pel, MD s/p left knee replacement [phone]) performed on 01/21/2020.  He had HHPT thru 12/3. He stopped CPM because bothering left hip.    Pertinent History arthritis, right shoulder bursitis,  HTN, hiatal hernia    Patient Stated Goals to get back to previous activities.    Currently in Pain? Yes    Pain Score 1     Pain Location Knee    Pain Orientation Left    Pain Descriptors / Indicators Tightness    Pain Type Acute pain;Surgical pain    Pain Onset 1 to 4 weeks ago    Pain Frequency Intermittent    Aggravating Factors  exercises    Pain Relieving Factors ice    Multiple Pain Sites Yes    Pain Score 5   in last week, worst 5-6/10, best 0/10   Pain Location Hip    Pain Orientation Left;Lateral    Pain Descriptors / Indicators Aching;Throbbing    Pain Type Acute pain    Pain Onset 1 to 4 weeks ago    Pain Frequency Intermittent    Aggravating Factors  movement, thinks CPM >110* may have started pain. He stopped CPM on 12/1.    Pain Relieving Factors stop movements, ice, muscle relaxors (oxy nor Advil help)    Effect of Pain on Daily Activities limits movements              [facility] PT Assessment - 02/08/20 1430      Assessment   Medical Diagnosis Left  Total Knee Replacement    Referring Provider (PT) Meredith Pel, MD    Onset Date/Surgical Date 01/21/20    Prior Therapy HHPT      Precautions   Precautions None      Restrictions   Weight Bearing Restrictions No      Balance Screen   Has the patient fallen in the past 6 months No    Has the patient had a decrease in activity level because of a fear of falling?  Yes    Is the patient reluctant to leave their home because of a fear of falling?  No      Home Social worker Private residence    Living Arrangements Spouse/significant other   cat   Type of [place] to enter    Entrance Stairs-Number of Steps 4    Entrance Stairs-Rails Left    Home Layout Two level;Able to live on main level with bedroom/bathroom;Full bath on main level    Alternate Level Stairs-Number of Steps 17    Alternate Level Stairs-Rails Right;Left;Can reach both      Prior Function    Level of Independence Independent;Independent with household mobility without device;Independent with community mobility without device    Vocation Retired    Leisure golf      Observation/Other Assessments   Focus on Therapeutic Outcomes (FOTO)  54.8594      ROM / Strength   AROM / PROM / Strength AROM;PROM;Strength      AROM   Right/Left Knee Left    Left Knee Extension -8   seated LAQ   Left Knee Flexion 72   standing     PROM   PROM Assessment Site Knee    Right/Left Knee Left    Left Knee Flexion 112      Strength   Strength Assessment Site Knee    Right/Left Knee Left    Left Knee Flexion 3-/5    Left Knee Extension 3-/5      Palpation   Palpation comment left ITB tightness with increased pain at Greater Trochanteric Bursa area.       Special Tests    Special Tests Hip Special Tests    Hip Special Tests  Ober's Test      Ober's Test   Findings Positive    Side Left      Ambulation/Gait   Ambulation/Gait Yes    Ambulation/Gait Assistance 5: Supervision    Ambulation Distance (Feet) 100 Feet    Assistive device None    Gait Pattern Step-through pattern;Decreased arm swing - left;Decreased stance time - left;Decreased step length - right;Decreased hip/knee flexion - left;Left hip hike;Left circumduction;Left flexed knee in stance;Antalgic    Ambulation Surface Level;Indoor    Gait velocity 1.21 ft/sec                      Objective measurements completed on examination: See above findings.               PT Education - 02/08/20 1527    Education Details Ice massage to left ITB & TKA scar,  Medbridge Access Code: [email or web address]  + reviewed HHPT exercises (see pt instructions)    Person(s) Educated Patient    Methods Explanation;Demonstration;Tactile cues;Verbal cues;Handout    Comprehension Verbalized understanding;Returned demonstration;Need further instruction               PT Long Term Goals - 02/08/20 1539  PT LONG TERM GOAL  #1   Title FOTO score >/= 70% functional    Time 6    Period Weeks    Status New    Target Date 03/18/20      PT LONG TERM GOAL #2   Title left Knee & hip pain </= 1/10    Time 6    Period Weeks    Status New    Target Date 03/18/20      PT LONG TERM GOAL #3   Title Left knee PROM 0* extension to 125* flexion    Time 6    Period Weeks    Status New    Target Date 03/18/20      PT LONG TERM GOAL #4   Title Left knee AROM seated extension >/= -3* & standing flexion >/= 90*    Time 6    Period Weeks    Status New    Target Date 03/18/20      PT LONG TERM GOAL #5   Title Patient ambulates >500' & negotiates ramps, curbs & stairs single rail alternating pattern independently.    Time 6    Period Weeks    Status New    Target Date 03/18/20                  Plan - 02/08/20 1532    Clinical Impression Statement This 59yo male underwent a left Total Knee Replacement 18 days prior to PT evaluation.  He has limited passive & active range of left knee. He has increased edema & decreased scar mobilization of left knee. He has left hip pain that appears to be ITB irritation & possible bursitis. He reports it started with CPM >110* which may have rotated his leg if not enough knee range available.  Patient would benefit from skilled PT to improve function & decrease deficits.    Stability/Clinical Decision Making Stable/Uncomplicated    Clinical Decision Making Moderate    Rehab Potential Good    PT Frequency 3x / week   3x/wk for 4 weeks, then 2x/wk for 2 weeks   PT Duration 6 weeks    PT Treatment/Interventions ADLs/Self Care Home Management;Cryotherapy;Electrical Stimulation;DME Instruction;Gait training;Stair training;Functional mobility training;Therapeutic activities;Therapeutic exercise;Balance training;Neuromuscular re-education;Patient/family education;Manual techniques;Scar mobilization;Passive range of motion;Dry needling;Vasopneumatic Device;Joint Manipulations    PT  Next Visit Plan manual therapy for knee & ITB,  may benefit from dry needling for hip pain,  update HEP,  vaso for edema    PT Home Exercise Plan Medbridge Access Code: [email or web address]           Patient will benefit from skilled therapeutic intervention in order to improve the following deficits and impairments:  Abnormal gait, Decreased balance, Decreased range of motion, Decreased scar mobility, Decreased strength, Difficulty walking, Increased edema, Impaired flexibility, Postural dysfunction, Pain  Visit Diagnosis: Stiffness of left knee, not elsewhere classified  Localized edema  Pain in left leg  Unsteadiness on feet  Other abnormalities of gait and mobility  Muscle weakness (generalized)     Problem List Patient Active Problem List   Diagnosis Date Noted  . S/P total knee arthroplasty, 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  . Recurrent left knee instability 09/01/2019  . Baker's cyst of knee, left 08/12/2019  . 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  . Acute bursitis of right shoulder 02/02/2019  . Coughing 11/06/2018  . Penicillin allergy 11/06/2018  . Hip flexor tightness 09/29/2018  . 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  . Hyperglycemia 08/02/2018  . HLD (hyperlipidemia)  08/02/2018  . Vitamin D deficiency 08/02/2018  . Preventative health care 07/30/2018  . HTN (hypertension) 07/30/2018  . Other allergic rhinitis 02/21/2015    Jamey Reas PT, DPT 02/08/2020, 3:43 PM  [facility] Physical Therapy [address] [place], [place], [address] Phone: [phone]   Fax:  [phone]  Name: Patrick [name] MRN: [id number] Date of Birth: 1960-08-21

## 2020-02-08 NOTE — Patient Instructions (Signed)
HHPT Supine SLR, quad set on ankle wedge, abd/adduction, heel slides w/strap Seated heel slides on furniture mover Standing hip abduction alternating, heel/toe raises, marching, hip extension, hamstring curls, squat w/rail, side stepping  Access Code: [email or web address] URL: [email or web address] Date: 02/08/2020 Prepared by: Jamey Reas  Exercises Sidelying ITB Stretch off Table - 1-3 x daily - 7 x weekly - 1 sets - 3 reps - 15 seconds hold ITB Stretch at Wall - 1-3 x daily - 7 x weekly - 1 sets - 3 reps - 15 seconds hold IT Band Mobilization with Small Ball - 1-3 x daily - 7 x weekly - 1 sets - 3 reps - 15 seconds hold  Patient Education Ice Massage

## 2020-02-10 ENCOUNTER — Encounter: Payer: Self-pay | Admitting: Rehabilitative and Restorative Service Providers"

## 2020-02-10 ENCOUNTER — Other Ambulatory Visit: Payer: Self-pay

## 2020-02-10 ENCOUNTER — Ambulatory Visit (INDEPENDENT_AMBULATORY_CARE_PROVIDER_SITE_OTHER): Payer: 59 | Admitting: Rehabilitative and Restorative Service Providers"

## 2020-02-10 DIAGNOSIS — M79605 Pain in left leg: Secondary | ICD-10-CM

## 2020-02-10 DIAGNOSIS — R2681 Unsteadiness on feet: Secondary | ICD-10-CM | POA: Diagnosis not present

## 2020-02-10 DIAGNOSIS — M25662 Stiffness of left knee, not elsewhere classified: Secondary | ICD-10-CM

## 2020-02-10 DIAGNOSIS — R2689 Other abnormalities of gait and mobility: Secondary | ICD-10-CM

## 2020-02-10 DIAGNOSIS — R600 Localized edema: Secondary | ICD-10-CM

## 2020-02-10 NOTE — Therapy (Signed)
[facility] Physical Therapy [address] [place], [place], [address] Phone: [phone]   Fax:  [phone]  Physical Therapy Treatment  Patient Details  Name: Patrick [name] MRN: [id number] Date of Birth: [birth year] Referring Provider (PT): Meredith Pel, MD   Encounter Date: 02/10/2020   PT End of Session - 02/10/20 1106    Visit Number 2    Number of Visits 16    Date for PT Re-Evaluation 03/18/20    Authorization Type [employer]    PT Start Time 1100    PT Stop Time 1142    PT Time Calculation (min) 42 min    Activity Tolerance Patient tolerated treatment well    Behavior During Therapy [facility] for tasks assessed/performed           Past Medical History:  Diagnosis Date  . Allergy   . Arthritis    left knee  . Dysrhythmia    " [name]"  . Heart murmur   . History of hiatal hernia    4 years ago per patient- no surgery needed  . History of kidney stones   . Hypertension     Past Surgical History:  Procedure Laterality Date  . APPENDECTOMY    . Arthroscopic knee surgeries on both knees    . CHOLECYSTECTOMY    . NASAL SEPTOPLASTY W/ TURBINOPLASTY Bilateral 04/13/2019   Procedure: NASAL SEPTOPLASTY WITH BILATERAL TURBINATE REDUCTION;  Surgeon: Leta Baptist, MD;  Location: [place];  Service: ENT;  Laterality: Bilateral;  . TOTAL KNEE ARTHROPLASTY Left 01/21/2020   Procedure: LEFT TOTAL KNEE ARTHROPLASTY;  Surgeon: Meredith Pel, MD;  Location: [place];  Service: Orthopedics;  Laterality: Left;  Marland Kitchen VASECTOMY      There were no vitals filed for this visit.   Subjective Assessment - 02/10/20 1104    Subjective Pt. indicated Lt hip continued to be most prominant pain area.  Pt. stated up some of his exercises with some mild improvements at times.    Pertinent History arthritis, right shoulder bursitis, HTN, hiatal hernia    Patient Stated Goals to get back to previous activities.     Currently in Pain? Yes    Pain Score --   mild   Pain Location Knee   lateral hip/thigh   Pain Orientation Left    Pain Onset 1 to 4 weeks ago    Pain Score 2    Pain Location Hip    Pain Orientation Left    Pain Onset 1 to 4 weeks ago                             [facility] Adult PT Treatment/Exercise - 02/10/20 0001      Neuro Re-ed    Neuro Re-ed Details  SLS c contralateral LE cone touch anterior/medial/lateral 3 x 10 bilateral, tandem ambulation fwd/back each way 15 ft x 5 each, lateral stepping 3 cones x 10 bilateral      Exercises   Exercises Knee/Hip      Knee/Hip Exercises: Stretches   Gastroc Stretch 30 seconds;3 reps;Left   runner stretch incline board     Knee/Hip Exercises: Seated   Other Seated Knee/Hip Exercises seated SLR 3 x 10 Lt LE      Manual Therapy   Manual therapy comments percussive device to Lt glute med, lateral quads, it band.  MWM c IR/flexion, distraction to Lt  knee                       PT Long Term Goals - 02/08/20 1539      PT LONG TERM GOAL #1   Title FOTO score >/= 70% functional    Time 6    Period Weeks    Status New    Target Date 03/18/20      PT LONG TERM GOAL #2   Title left Knee & hip pain </= 1/10    Time 6    Period Weeks    Status New    Target Date 03/18/20      PT LONG TERM GOAL #3   Title Left knee PROM 0* extension to 125* flexion    Time 6    Period Weeks    Status New    Target Date 03/18/20      PT LONG TERM GOAL #4   Title Left knee AROM seated extension >/= -3* & standing flexion >/= 90*    Time 6    Period Weeks    Status New    Target Date 03/18/20      PT LONG TERM GOAL #5   Title Patient ambulates >500' & negotiates ramps, curbs & stairs single rail alternating pattern independently.    Time 6    Period Weeks    Status New    Target Date 03/18/20                 Plan - 02/10/20 1121    Clinical Impression Statement Noted tenderness and TrP in lateral  quadriceps muscle group, IT band region as well.  STM intervention limited due to severity of tenderness.  Pt. may continue to benefit from skilled PT services.  Pt. may transition to 2x/week.    Stability/Clinical Decision Making Stable/Uncomplicated    Rehab Potential Good    PT Frequency 3x / week   3x/wk for 4 weeks, then 2x/wk for 2 weeks   PT Duration 6 weeks    PT Treatment/Interventions ADLs/Self Care Home Management;Cryotherapy;Electrical Stimulation;DME Instruction;Gait training;Stair training;Functional mobility training;Therapeutic activities;Therapeutic exercise;Balance training;Neuromuscular re-education;Patient/family education;Manual techniques;Scar mobilization;Passive range of motion;Dry needling;Vasopneumatic Device;Joint Manipulations    PT Next Visit Plan STM for lateral quad/ IT band, possible DN to area.  Continue strengthening progression.    PT Home Exercise Plan Medbridge Access Code: [email or web address]    Consulted and Agree with Plan of Care Patient           Patient will benefit from skilled therapeutic intervention in order to improve the following deficits and impairments:  Abnormal gait, Decreased balance, Decreased range of motion, Decreased scar mobility, Decreased strength, Difficulty walking, Increased edema, Impaired flexibility, Postural dysfunction, Pain  Visit Diagnosis: Stiffness of left knee, not elsewhere classified  Pain in left leg  Localized edema  Unsteadiness on feet  Other abnormalities of gait and mobility     Problem List Patient Active Problem List   Diagnosis Date Noted  . S/P total knee arthroplasty, 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  . Recurrent left knee instability 09/01/2019  . Baker's cyst of knee, left 08/12/2019  . 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  . Acute bursitis of right shoulder 02/02/2019  . Coughing 11/06/2018  . Penicillin allergy 11/06/2018  . Hip flexor tightness 09/29/2018  . 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  .  Hyperglycemia 08/02/2018  . HLD (hyperlipidemia) 08/02/2018  . Vitamin D deficiency 08/02/2018  . Preventative health care 07/30/2018  . HTN (hypertension) 07/30/2018  .  Other allergic rhinitis 02/21/2015    Scot Jun, PT, DPT, OCS, ATC 02/10/20  11:35 AM    [facility] Physical Therapy [address] [place], [place], [address] Phone: [phone]   Fax:  [phone]  Name: Patrick [name] MRN: [id number] Date of Birth: 10-23-1960

## 2020-02-11 ENCOUNTER — Ambulatory Visit (INDEPENDENT_AMBULATORY_CARE_PROVIDER_SITE_OTHER): Payer: 59 | Admitting: Physical Therapy

## 2020-02-11 ENCOUNTER — Encounter: Payer: Self-pay | Admitting: Physical Therapy

## 2020-02-11 DIAGNOSIS — M79605 Pain in left leg: Secondary | ICD-10-CM

## 2020-02-11 DIAGNOSIS — R2681 Unsteadiness on feet: Secondary | ICD-10-CM | POA: Diagnosis not present

## 2020-02-11 DIAGNOSIS — M25662 Stiffness of left knee, not elsewhere classified: Secondary | ICD-10-CM

## 2020-02-11 DIAGNOSIS — M6281 Muscle weakness (generalized): Secondary | ICD-10-CM

## 2020-02-11 DIAGNOSIS — R600 Localized edema: Secondary | ICD-10-CM | POA: Diagnosis not present

## 2020-02-11 DIAGNOSIS — R2689 Other abnormalities of gait and mobility: Secondary | ICD-10-CM

## 2020-02-11 NOTE — Therapy (Signed)
[facility] Physical Therapy [address] [place], [place], [address] Phone: [phone]   Fax:  [phone]  Physical Therapy Treatment  Patient Details  Name: Patrick [name] MRN: [id number] Date of Birth: [birth year] Referring Provider (PT): Meredith Pel, MD   Encounter Date: 02/11/2020   PT End of Session - 02/11/20 1104    Visit Number 3    Number of Visits 16    Date for PT Re-Evaluation 03/18/20    Authorization Type [employer]    PT Start Time 1100    PT Stop Time 1158    PT Time Calculation (min) 58 min    Activity Tolerance Patient tolerated treatment well    Behavior During Therapy [facility] for tasks assessed/performed           Past Medical History:  Diagnosis Date  . Allergy   . Arthritis    left knee  . Dysrhythmia    " [name]"  . Heart murmur   . History of hiatal hernia    4 years ago per patient- no surgery needed  . History of kidney stones   . Hypertension     Past Surgical History:  Procedure Laterality Date  . APPENDECTOMY    . Arthroscopic knee surgeries on both knees    . CHOLECYSTECTOMY    . NASAL SEPTOPLASTY W/ TURBINOPLASTY Bilateral 04/13/2019   Procedure: NASAL SEPTOPLASTY WITH BILATERAL TURBINATE REDUCTION;  Surgeon: Leta Baptist, MD;  Location: [place];  Service: ENT;  Laterality: Bilateral;  . TOTAL KNEE ARTHROPLASTY Left 01/21/2020   Procedure: LEFT TOTAL KNEE ARTHROPLASTY;  Surgeon: Meredith Pel, MD;  Location: [place];  Service: Orthopedics;  Laterality: Left;  Marland Kitchen VASECTOMY      There were no vitals filed for this visit.   Subjective Assessment - 02/11/20 1100    Subjective His exercises are going well.    Pertinent History arthritis, right shoulder bursitis, HTN, hiatal hernia    Patient Stated Goals to get back to previous activities.    Currently in Pain? Yes    Pain Score 4    range 0-4/10   Pain Location Hip    Pain Orientation Left;Lateral     Pain Descriptors / Indicators Aching;Sore;Sharp    Pain Type Acute pain    Pain Onset 1 to 4 weeks ago    Pain Frequency Intermittent    Aggravating Factors  night sleeping on left side    Pain Relieving Factors ice    Pain Onset 1 to 4 weeks ago                             [facility] Adult PT Treatment/Exercise - 02/11/20 1057      Self-Care   Self-Care ADL's    ADL's PT demo & verbal cues on positioning LLE with pillows to minimize lateral hip pain. Pt verbalized understanding.      Neuro Re-ed    Neuro Re-ed Details  --      Exercises   Exercises Knee/Hip      Knee/Hip Exercises: Stretches   Gastroc Stretch 30 seconds;3 reps;Left   runner stretch incline board   Gastroc Stretch Limitations standing on step heel depression    Other Knee/Hip Stretches --      Knee/Hip Exercises: Aerobic   Recumbent Bike Seat 8 Level 4 for 8 min      Knee/Hip Exercises: Machines  for Strengthening   Cybex Knee Extension LLE 15# 10 reps 2 sets    Cybex Knee Flexion LLE only 25# 10 reps 2 sets    Cybex Leg Press shuttle leg press BLEs 125# 15 reps 2 sets    Other Machine shuttle heel raises 125# 15 reps 2 sets      Knee/Hip Exercises: Standing   Forward Step Up Left;2 sets;10 reps;Hand Hold: 0;Step Height: 6"    Forward Step Up Limitations demo & verbal cues on technique    Step Down Left;2 sets;10 reps;Hand Hold: 0;Step Height: 6"    Step Down Limitations demo & verbal cues on technique      Knee/Hip Exercises: Seated   Other Seated Knee/Hip Exercises --      Modalities   Modalities Vasopneumatic      Vasopneumatic   Number Minutes Vasopneumatic  10 minutes    Vasopnuematic Location  Knee   positioned in TKE   Vasopneumatic Pressure Medium    Vasopneumatic Temperature  34      Manual Therapy   Manual therapy comments percussive device to Lt glute med, lateral quads, it band.  MWM c IR/flexion, distraction to Lt knee                  PT Education -  02/11/20 1127    Education Details PT instructed in exercise routine & form with exercises at [employer] and to return to workout on non PT days.    Person(s) Educated Patient    Methods Explanation;Verbal cues;Demonstration    Comprehension Verbalized understanding;Returned demonstration               PT Long Term Goals - 02/08/20 1539      PT LONG TERM GOAL #1   Title FOTO score >/= 70% functional    Time 6    Period Weeks    Status New    Target Date 03/18/20      PT LONG TERM GOAL #2   Title left Knee & hip pain </= 1/10    Time 6    Period Weeks    Status New    Target Date 03/18/20      PT LONG TERM GOAL #3   Title Left knee PROM 0* extension to 125* flexion    Time 6    Period Weeks    Status New    Target Date 03/18/20      PT LONG TERM GOAL #4   Title Left knee AROM seated extension >/= -3* & standing flexion >/= 90*    Time 6    Period Weeks    Status New    Target Date 03/18/20      PT LONG TERM GOAL #5   Title Patient ambulates >500' & negotiates ramps, curbs & stairs single rail alternating pattern independently.    Time 6    Period Weeks    Status New    Target Date 03/18/20                 Plan - 02/11/20 1104    Clinical Impression Statement PT instructed in exercise form / technique to return to [employer] workout to agument PT and he appears to understand.  His lateral knee & hip pain appears to be improving.    Stability/Clinical Decision Making Stable/Uncomplicated    Rehab Potential Good    PT Frequency 3x / week   3x/wk for 4 weeks, then 2x/wk for 2 weeks  PT Duration 6 weeks    PT Treatment/Interventions ADLs/Self Care Home Management;Cryotherapy;Electrical Stimulation;DME Instruction;Gait training;Stair training;Functional mobility training;Therapeutic activities;Therapeutic exercise;Balance training;Neuromuscular re-education;Patient/family education;Manual techniques;Scar mobilization;Passive range of motion;Dry  needling;Vasopneumatic Device;Joint Manipulations    PT Next Visit Plan STM for lateral quad/ IT band, possible DN to area.  Continue strengthening progression.    PT Home Exercise Plan Medbridge Access Code: [email or web address]    Consulted and Agree with Plan of Care Patient           Patient will benefit from skilled therapeutic intervention in order to improve the following deficits and impairments:  Abnormal gait,Decreased balance,Decreased range of motion,Decreased scar mobility,Decreased strength,Difficulty walking,Increased edema,Impaired flexibility,Postural dysfunction,Pain  Visit Diagnosis: Pain in left leg  Stiffness of left knee, not elsewhere classified  Localized edema  Unsteadiness on feet  Other abnormalities of gait and mobility  Muscle weakness (generalized)     Problem List Patient Active Problem List   Diagnosis Date Noted  . S/P total knee arthroplasty, 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  . Recurrent left knee instability 09/01/2019  . Baker's cyst of knee, left 08/12/2019  . 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  . Acute bursitis of right shoulder 02/02/2019  . Coughing 11/06/2018  . Penicillin allergy 11/06/2018  . Hip flexor tightness 09/29/2018  . 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  . Hyperglycemia 08/02/2018  . HLD (hyperlipidemia) 08/02/2018  . Vitamin D deficiency 08/02/2018  . Preventative health care 07/30/2018  . HTN (hypertension) 07/30/2018  . Other allergic rhinitis 02/21/2015    Jamey Reas PT, DPT 02/11/2020, 11:56 AM  [facility] Physical Therapy [address] [place], [place], [address] Phone: [phone]   Fax:  [phone]  Name: Ronon Ferger MRN: [id number] Date of Birth: 05-30-1960

## 2020-02-15 ENCOUNTER — Ambulatory Visit (INDEPENDENT_AMBULATORY_CARE_PROVIDER_SITE_OTHER): Payer: 59 | Admitting: Physical Therapy

## 2020-02-15 ENCOUNTER — Other Ambulatory Visit: Payer: Self-pay

## 2020-02-15 DIAGNOSIS — M25662 Stiffness of left knee, not elsewhere classified: Secondary | ICD-10-CM

## 2020-02-15 DIAGNOSIS — R2681 Unsteadiness on feet: Secondary | ICD-10-CM | POA: Diagnosis not present

## 2020-02-15 DIAGNOSIS — R600 Localized edema: Secondary | ICD-10-CM | POA: Diagnosis not present

## 2020-02-15 DIAGNOSIS — M79605 Pain in left leg: Secondary | ICD-10-CM | POA: Diagnosis not present

## 2020-02-15 DIAGNOSIS — R2689 Other abnormalities of gait and mobility: Secondary | ICD-10-CM

## 2020-02-15 DIAGNOSIS — M6281 Muscle weakness (generalized): Secondary | ICD-10-CM

## 2020-02-15 NOTE — Therapy (Signed)
[facility] Physical Therapy [address] [place], [place], [address] Phone: [phone]   Fax:  [phone]  Physical Therapy Treatment  Patient Details  Name: Patrick [name] MRN: [id number] Date of Birth: [birth year] Referring Provider (PT): Meredith Pel, MD   Encounter Date: 02/15/2020   PT End of Session - 02/15/20 1510    Visit Number 4    Number of Visits 16    Date for PT Re-Evaluation 03/18/20    Authorization Type [employer]    PT Start Time 1500    PT Stop Time 1555    PT Time Calculation (min) 55 min    Activity Tolerance Patient tolerated treatment well    Behavior During Therapy [facility] for tasks assessed/performed           Past Medical History:  Diagnosis Date  . Allergy   . Arthritis    left knee  . Dysrhythmia    " [name]"  . Heart murmur   . History of hiatal hernia    4 years ago per patient- no surgery needed  . History of kidney stones   . Hypertension     Past Surgical History:  Procedure Laterality Date  . APPENDECTOMY    . Arthroscopic knee surgeries on both knees    . CHOLECYSTECTOMY    . NASAL SEPTOPLASTY W/ TURBINOPLASTY Bilateral 04/13/2019   Procedure: NASAL SEPTOPLASTY WITH BILATERAL TURBINATE REDUCTION;  Surgeon: Leta Baptist, MD;  Location: [place];  Service: ENT;  Laterality: Bilateral;  . TOTAL KNEE ARTHROPLASTY Left 01/21/2020   Procedure: LEFT TOTAL KNEE ARTHROPLASTY;  Surgeon: Meredith Pel, MD;  Location: [place];  Service: Orthopedics;  Laterality: Left;  Marland Kitchen VASECTOMY      There were no vitals filed for this visit.   Subjective Assessment - 02/15/20 1500    Subjective He went to [employer] Saturday. He did leg machines similar to PT but decreased weight some. He was a little stiff in hamstrings but did not last long.    Pertinent History arthritis, right shoulder bursitis, HTN, hiatal hernia    Patient Stated Goals to get back to previous  activities.    Currently in Pain? Yes    Pain Score 1    since last PT session, worst 4/10, best 0/10   Pain Location Hip    Pain Orientation Left;Posterior    Pain Descriptors / Indicators Aching;Sore    Pain Type Acute pain    Pain Onset 1 to 4 weeks ago    Pain Frequency Intermittent    Aggravating Factors  sleeping    Pain Relieving Factors ice, massage, stretches    Pain Score 0   since last PT session, only 1/10   Pain Location Knee    Pain Orientation Left    Pain Descriptors / Indicators Aching    Pain Type Acute pain;Surgical pain    Pain Onset 1 to 4 weeks ago    Pain Frequency Intermittent    Aggravating Factors  postiional                             [facility] Adult PT Treatment/Exercise - 02/15/20 1457      Ambulation/Gait   Stairs Yes    Stairs Assistance 5: Supervision    Stair Management Technique Alternating pattern;Forwards;Two rails;One rail Right;One rail Left    Number of Stairs 11   3  reps   Height of Stairs 6      High Level Balance   High Level Balance Activities Tandem walking;Braiding   tandem backwards     Self-Care   Self-Care --    ADL's --      Exercises   Exercises Knee/Hip      Knee/Hip Exercises: Stretches   Active Hamstring Stretch Left;3 reps;20 seconds    Active Hamstring Stretch Limitations supine SLR with strap    ITB Stretch Left;5 reps;30 seconds    ITB Stretch Limitations cross leg with strap & hypervolt to piriformis muscle    Gastroc Stretch 30 seconds;3 reps;Left   runner stretch incline board   Gastroc Stretch Limitations standing on step heel depression    Other Knee/Hip Stretches adductor stretch SLR with strap 20 sec hold 3 reps      Knee/Hip Exercises: Aerobic   Recumbent Bike Seat 8 for 8 min total level 4 primarily with level 7 for 30 sec bouts 3 reps      Knee/Hip Exercises: Machines for Strengthening   Cybex Knee Extension LLE 15# 10 reps 2 sets    Cybex Knee Flexion LLE only 25# 10 reps 2 sets     Cybex Leg Press shuttle leg press BLEs 125# 15 reps 2 sets    Other Machine shuttle heel raises 125# 15 reps 2 sets      Knee/Hip Exercises: Standing   Forward Step Up Left;1 set;15 reps;Hand Hold: 0;Step Height: 8"    Forward Step Up Limitations demo & verbal cues on technique    Step Down Left;1 set;15 reps;Hand Hold: 0;Step Height: 8"    Step Down Limitations demo & verbal cues on technique      Modalities   Modalities Vasopneumatic      Vasopneumatic   Number Minutes Vasopneumatic  10 minutes    Vasopnuematic Location  Knee   positioned in TKE   Vasopneumatic Pressure Medium    Vasopneumatic Temperature  34      Manual Therapy   Manual Therapy Joint mobilization    Manual therapy comments percussive device to piriformis & quads during stretch /manual therapy    Joint Mobilization distraction, IR with knee flexion 10 sec hold for 2 minutes.                       PT Long Term Goals - 02/08/20 1539      PT LONG TERM GOAL #1   Title FOTO score >/= 70% functional    Time 6    Period Weeks    Status New    Target Date 03/18/20      PT LONG TERM GOAL #2   Title left Knee & hip pain </= 1/10    Time 6    Period Weeks    Status New    Target Date 03/18/20      PT LONG TERM GOAL #3   Title Left knee PROM 0* extension to 125* flexion    Time 6    Period Weeks    Status New    Target Date 03/18/20      PT LONG TERM GOAL #4   Title Left knee AROM seated extension >/= -3* & standing flexion >/= 90*    Time 6    Period Weeks    Status New    Target Date 03/18/20      PT LONG TERM GOAL #5   Title Patient ambulates >500' & negotiates ramps,  curbs & stairs single rail alternating pattern independently.    Time 6    Period Weeks    Status New    Target Date 03/18/20                 Plan - 02/15/20 1510    Clinical Impression Statement Lateral hip pain is improving.  His knee strength & function also are improving.    Stability/Clinical  Decision Making Stable/Uncomplicated    Rehab Potential Good    PT Frequency 3x / week   3x/wk for 4 weeks, then 2x/wk for 2 weeks   PT Duration 6 weeks    PT Treatment/Interventions ADLs/Self Care Home Management;Cryotherapy;Electrical Stimulation;DME Instruction;Gait training;Stair training;Functional mobility training;Therapeutic activities;Therapeutic exercise;Balance training;Neuromuscular re-education;Patient/family education;Manual techniques;Scar mobilization;Passive range of motion;Dry needling;Vasopneumatic Device;Joint Manipulations    PT Next Visit Plan STM for lateral quad/ IT band,  Continue strengthening progression.    PT Home Exercise Plan Medbridge Access Code: [email or web address]    Consulted and Agree with Plan of Care Patient           Patient will benefit from skilled therapeutic intervention in order to improve the following deficits and impairments:  Abnormal gait,Decreased balance,Decreased range of motion,Decreased scar mobility,Decreased strength,Difficulty walking,Increased edema,Impaired flexibility,Postural dysfunction,Pain  Visit Diagnosis: Pain in left leg  Stiffness of left knee, not elsewhere classified  Localized edema  Unsteadiness on feet  Other abnormalities of gait and mobility  Muscle weakness (generalized)     Problem List Patient Active Problem List   Diagnosis Date Noted  . S/P total knee arthroplasty, 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  . Recurrent left knee instability 09/01/2019  . Baker's cyst of knee, left 08/12/2019  . 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  . Acute bursitis of right shoulder 02/02/2019  . Coughing 11/06/2018  . Penicillin allergy 11/06/2018  . Hip flexor tightness 09/29/2018  . 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  . Hyperglycemia 08/02/2018  . HLD (hyperlipidemia) 08/02/2018  . Vitamin D deficiency 08/02/2018  . Preventative health care 07/30/2018  . HTN (hypertension) 07/30/2018  . Other allergic rhinitis 02/21/2015     Jamey Reas, PT, DPT 02/15/2020, 3:58 PM  [facility] Physical Therapy [address] [place], [place], [address] Phone: [phone]   Fax:  [phone]  Name: Patrick [name] MRN: [id number] Date of Birth: [birth year]

## 2020-02-19 ENCOUNTER — Ambulatory Visit (INDEPENDENT_AMBULATORY_CARE_PROVIDER_SITE_OTHER): Payer: 59 | Admitting: Rehabilitative and Restorative Service Providers"

## 2020-02-19 ENCOUNTER — Other Ambulatory Visit: Payer: Self-pay

## 2020-02-19 ENCOUNTER — Encounter: Payer: Self-pay | Admitting: Rehabilitative and Restorative Service Providers"

## 2020-02-19 DIAGNOSIS — R2681 Unsteadiness on feet: Secondary | ICD-10-CM

## 2020-02-19 DIAGNOSIS — M6281 Muscle weakness (generalized): Secondary | ICD-10-CM

## 2020-02-19 DIAGNOSIS — M79605 Pain in left leg: Secondary | ICD-10-CM

## 2020-02-19 DIAGNOSIS — M25662 Stiffness of left knee, not elsewhere classified: Secondary | ICD-10-CM | POA: Diagnosis not present

## 2020-02-19 DIAGNOSIS — R600 Localized edema: Secondary | ICD-10-CM

## 2020-02-19 DIAGNOSIS — R2689 Other abnormalities of gait and mobility: Secondary | ICD-10-CM

## 2020-02-19 NOTE — Therapy (Signed)
[facility] Physical Therapy [address] [place], [place], [address] Phone: [phone]   Fax:  [phone]  Physical Therapy Treatment  Patient Details  Name: Patrick [name] MRN: [id number] Date of Birth: 1960/11/19 Referring Provider (PT): Meredith Pel, MD   Encounter Date: 02/19/2020   PT End of Session - 02/19/20 1152    Visit Number 5    Number of Visits 16    Date for PT Re-Evaluation 03/18/20    Authorization Type [employer]    PT Start Time 4008    PT Stop Time 1221    PT Time Calculation (min) 39 min    Activity Tolerance Patient tolerated treatment well    Behavior During Therapy [facility] for tasks assessed/performed           Past Medical History:  Diagnosis Date  . Allergy   . Arthritis    left knee  . Dysrhythmia    " [name]"  . Heart murmur   . History of hiatal hernia    4 years ago per patient- no surgery needed  . History of kidney stones   . Hypertension     Past Surgical History:  Procedure Laterality Date  . APPENDECTOMY    . Arthroscopic knee surgeries on both knees    . CHOLECYSTECTOMY    . NASAL SEPTOPLASTY W/ TURBINOPLASTY Bilateral 04/13/2019   Procedure: NASAL SEPTOPLASTY WITH BILATERAL TURBINATE REDUCTION;  Surgeon: Leta Baptist, MD;  Location: [place];  Service: ENT;  Laterality: Bilateral;  . TOTAL KNEE ARTHROPLASTY Left 01/21/2020   Procedure: LEFT TOTAL KNEE ARTHROPLASTY;  Surgeon: Meredith Pel, MD;  Location: [place];  Service: Orthopedics;  Laterality: Left;  Marland Kitchen VASECTOMY      There were no vitals filed for this visit.   Subjective Assessment - 02/19/20 1149    Subjective Pt. stated lateral hip pain ranges from 2/10 to 5/10    Pertinent History arthritis, right shoulder bursitis, HTN, hiatal hernia    Patient Stated Goals to get back to previous activities.    Pain Onset 1 to 4 weeks ago    Pain Onset 1 to 4 weeks ago                              [facility] Adult PT Treatment/Exercise - 02/19/20 0001      Neuro Re-ed    Neuro Re-ed Details  SLS on foam 30 sec x 4 bilateral, star excursion 5 cones c slider x 6 each bilateral      Knee/Hip Exercises: Stretches   Gastroc Stretch 30 seconds;3 reps;Both   incline board     Knee/Hip Exercises: Aerobic   Recumbent Bike lvl 2 10 mins      Knee/Hip Exercises: Standing   Lateral Step Up Step Height: 6";20 reps;Both   eccentric lower focus   Other Standing Knee Exercises doorway hip abd 5 sec hold x 10 bilateral      Manual Therapy   Manual therapy comments percussive device glute med/min, max Lt, compression to Lt glute med c movement                       PT Long Term Goals - 02/08/20 1539      PT LONG TERM GOAL #1   Title FOTO score >/= 70% functional    Time 6    Period Weeks  Status New    Target Date 03/18/20      PT LONG TERM GOAL #2   Title left Knee & hip pain </= 1/10    Time 6    Period Weeks    Status New    Target Date 03/18/20      PT LONG TERM GOAL #3   Title Left knee PROM 0* extension to 125* flexion    Time 6    Period Weeks    Status New    Target Date 03/18/20      PT LONG TERM GOAL #4   Title Left knee AROM seated extension >/= -3* & standing flexion >/= 90*    Time 6    Period Weeks    Status New    Target Date 03/18/20      PT LONG TERM GOAL #5   Title Patient ambulates >500' & negotiates ramps, curbs & stairs single rail alternating pattern independently.    Time 6    Period Weeks    Status New    Target Date 03/18/20                 Plan - 02/19/20 1204    Clinical Impression Statement Sent communication to MD to discuss possible dry needling for lateral hip pain.  Included lateral hip strengthening in doorway for improved muscle performance in affected area.    Stability/Clinical Decision Making Stable/Uncomplicated    Rehab Potential Good    PT Frequency 3x / week   3x/wk  for 4 weeks, then 2x/wk for 2 weeks   PT Duration 6 weeks    PT Treatment/Interventions ADLs/Self Care Home Management;Cryotherapy;Electrical Stimulation;DME Instruction;Gait training;Stair training;Functional mobility training;Therapeutic activities;Therapeutic exercise;Balance training;Neuromuscular re-education;Patient/family education;Manual techniques;Scar mobilization;Passive range of motion;Dry needling;Vasopneumatic Device;Joint Manipulations    PT Next Visit Plan STM for lateral quad/ IT band,  Continue strengthening progression.    PT Home Exercise Plan Medbridge Access Code: [email or web address]    Consulted and Agree with Plan of Care Patient           Patient will benefit from skilled therapeutic intervention in order to improve the following deficits and impairments:  Abnormal gait,Decreased balance,Decreased range of motion,Decreased scar mobility,Decreased strength,Difficulty walking,Increased edema,Impaired flexibility,Postural dysfunction,Pain  Visit Diagnosis: Pain in left leg  Stiffness of left knee, not elsewhere classified  Localized edema  Unsteadiness on feet  Other abnormalities of gait and mobility  Muscle weakness (generalized)     Problem List Patient Active Problem List   Diagnosis Date Noted  . S/P total knee arthroplasty, 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  . Recurrent left knee instability 09/01/2019  . Baker's cyst of knee, left 08/12/2019  . 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  . Acute bursitis of right shoulder 02/02/2019  . Coughing 11/06/2018  . Penicillin allergy 11/06/2018  . Hip flexor tightness 09/29/2018  . 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  . Hyperglycemia 08/02/2018  . HLD (hyperlipidemia) 08/02/2018  . Vitamin D deficiency 08/02/2018  . Preventative health care 07/30/2018  . HTN (hypertension) 07/30/2018  . Other allergic rhinitis 02/21/2015    Scot Jun, PT, DPT, OCS, ATC 02/19/20  12:17 PM    [facility] Physical  Therapy [address] [place], [place], [address] Phone: [phone]   Fax:  [phone]  Name: Patrick [name] MRN: [id number] Date of Birth: 04/22/1960

## 2020-02-19 NOTE — Telephone Encounter (Signed)
Pls advise.  

## 2020-02-20 MED ORDER — METHOCARBAMOL 500 MG PO TABS: 500.0000 mg | ORAL_TABLET | Freq: Four times a day (QID) | ORAL | 0 refills | Status: DC | PRN

## 2020-02-20 MED ORDER — CELECOXIB 200 MG PO CAPS
200.0000 mg | ORAL_CAPSULE | Freq: Two times a day (BID) | ORAL | 0 refills | Status: DC
Start: 2020-02-20 — End: 2020-03-18

## 2020-02-23 ENCOUNTER — Encounter: Payer: 59 | Admitting: Rehabilitative and Restorative Service Providers"

## 2020-02-23 ENCOUNTER — Encounter: Payer: Self-pay | Admitting: Orthopedic Surgery

## 2020-02-23 NOTE — Telephone Encounter (Signed)
If he is having groin pain he should come in for xrays before xmas thx if no groin pain ok for dry needling

## 2020-02-24 ENCOUNTER — Ambulatory Visit (INDEPENDENT_AMBULATORY_CARE_PROVIDER_SITE_OTHER): Payer: 59 | Admitting: Rehabilitative and Restorative Service Providers"

## 2020-02-24 ENCOUNTER — Encounter: Payer: Self-pay | Admitting: Rehabilitative and Restorative Service Providers"

## 2020-02-24 ENCOUNTER — Other Ambulatory Visit: Payer: Self-pay

## 2020-02-24 DIAGNOSIS — R600 Localized edema: Secondary | ICD-10-CM | POA: Diagnosis not present

## 2020-02-24 DIAGNOSIS — M25662 Stiffness of left knee, not elsewhere classified: Secondary | ICD-10-CM

## 2020-02-24 DIAGNOSIS — R2681 Unsteadiness on feet: Secondary | ICD-10-CM

## 2020-02-24 DIAGNOSIS — R2689 Other abnormalities of gait and mobility: Secondary | ICD-10-CM

## 2020-02-24 DIAGNOSIS — M79605 Pain in left leg: Secondary | ICD-10-CM | POA: Diagnosis not present

## 2020-02-24 DIAGNOSIS — M6281 Muscle weakness (generalized): Secondary | ICD-10-CM

## 2020-02-24 NOTE — Therapy (Signed)
[facility] [address] [place], [place], [address] Phone: [phone]   Fax:  [phone]  Physical Therapy Treatment  Patient Details  Name: Patrick [name] MRN: [id number] Date of Birth: 07-04-1960 Referring Provider (PT): Meredith Pel, MD   Encounter Date: 02/24/2020   PT End of Session - 02/24/20 1505    Visit Number 6    Number of Visits 16    Date for PT Re-Evaluation 03/18/20    Authorization Type [employer]    PT Start Time 1508    PT Stop Time 1548    PT Time Calculation (min) 40 min    Activity Tolerance Patient tolerated treatment well    Behavior During Therapy [facility] for tasks assessed/performed           Past Medical History:  Diagnosis Date   Allergy    Arthritis    left knee   Dysrhythmia    " [name]"   Heart murmur    History of hiatal hernia    4 years ago per patient- no surgery needed   History of kidney stones    Hypertension     Past Surgical History:  Procedure Laterality Date   APPENDECTOMY     Arthroscopic knee surgeries on both knees     CHOLECYSTECTOMY     NASAL SEPTOPLASTY W/ TURBINOPLASTY Bilateral 04/13/2019   Procedure: NASAL SEPTOPLASTY WITH BILATERAL TURBINATE REDUCTION;  Surgeon: Leta Baptist, MD;  Location: [place];  Service: ENT;  Laterality: Bilateral;   TOTAL KNEE ARTHROPLASTY Left 01/21/2020   Procedure: LEFT TOTAL KNEE ARTHROPLASTY;  Surgeon: Meredith Pel, MD;  Location: [place];  Service: Orthopedics;  Laterality: Left;   VASECTOMY      There were no vitals filed for this visit.   Subjective Assessment - 02/24/20 1518    Subjective Pt. indicated feeling lateral hip complaints continued and felt like it gave him trouble c things in day and night.  Pt. stated some home treatments helped some but was short term.    Pertinent History arthritis, right shoulder bursitis, HTN, hiatal hernia    Patient Stated Goals  to get back to previous activities.    Currently in Pain? Yes    Pain Score 4     Pain Location Hip    Pain Orientation Left    Pain Onset 1 to 4 weeks ago    Aggravating Factors  sleeping, constant during day    Pain Onset 1 to 4 weeks ago                             [facility] Adult PT Treatment/Exercise - 02/24/20 0001      Knee/Hip Exercises: Stretches   Gastroc Stretch 30 seconds;3 reps;Both   incline board     Knee/Hip Exercises: Aerobic   Other Aerobic UBE LE only 10 mins lvl 5      Knee/Hip Exercises: Machines for Strengthening   Cybex Knee Extension Lt LE eccentric 20 lbs 3 x 10    Cybex Knee Flexion Lt LE only 25 lbs 3 x 10      Knee/Hip Exercises: Standing   Other Standing Knee Exercises single knee to opposite shoulder 15 sec x 5 Lt LE    Other Standing Knee Exercises supine thomas stretch 15 sec x 3      Manual Therapy   Manual therapy comments compression to  Lt glute med/min, Lt TFL            Trigger Point Dry Needling - 02/24/20 0001    Consent Given? Yes    Education Handout Provided Yes    Muscles Treated Back/Hip Gluteus medius   med, min, and TFL on Lt   Gluteus Medius Response Twitch response elicited                     PT Long Term Goals - 02/08/20 1539      PT LONG TERM GOAL #1   Title FOTO score >/= 70% functional    Time 6    Period Weeks    Status New    Target Date 03/18/20      PT LONG TERM GOAL #2   Title left Knee & hip pain </= 1/10    Time 6    Period Weeks    Status New    Target Date 03/18/20      PT LONG TERM GOAL #3   Title Left knee PROM 0* extension to 125* flexion    Time 6    Period Weeks    Status New    Target Date 03/18/20      PT LONG TERM GOAL #4   Title Left knee AROM seated extension >/= -3* & standing flexion >/= 90*    Time 6    Period Weeks    Status New    Target Date 03/18/20      PT LONG TERM GOAL #5   Title Patient ambulates >500' & negotiates ramps, curbs & stairs  single rail alternating pattern independently.    Time 6    Period Weeks    Status New    Target Date 03/18/20                 Plan - 02/24/20 1520    Clinical Impression Statement Concordant symptoms noted from intervention in Lt hip for Trigger Point release.  Fair to good tolerance overall to manual techniques.  Reassess results next visit.    Stability/Clinical Decision Making Stable/Uncomplicated    Rehab Potential Good    PT Frequency 3x / week   3x/wk for 4 weeks, then 2x/wk for 2 weeks   PT Duration 6 weeks    PT Treatment/Interventions ADLs/Self Care Home Management;Cryotherapy;Electrical Stimulation;DME Instruction;Gait training;Stair training;Functional mobility training;Therapeutic activities;Therapeutic exercise;Balance training;Neuromuscular re-education;Patient/family education;Manual techniques;Scar mobilization;Passive range of motion;Dry needling;Vasopneumatic Device;Joint Manipulations    PT Next Visit Plan STM for lateral quad/ IT band,  Continue strengthening progression.    PT Home Exercise Plan Medbridge Access Code: [email or web address]    Consulted and Agree with Plan of Care Patient           Patient will benefit from skilled therapeutic intervention in order to improve the following deficits and impairments:  Abnormal gait,Decreased balance,Decreased range of motion,Decreased scar mobility,Decreased strength,Difficulty walking,Increased edema,Impaired flexibility,Postural dysfunction,Pain  Visit Diagnosis: Pain in left leg  Stiffness of left knee, not elsewhere classified  Localized edema  Unsteadiness on feet  Other abnormalities of gait and mobility  Muscle weakness (generalized)     Problem List Patient Active Problem List   Diagnosis Date Noted   S/P total knee arthroplasty, 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   Recurrent left knee instability 09/01/2019   Baker's cyst of knee, left 08/12/2019   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   Acute bursitis of  right shoulder 02/02/2019   Coughing 11/06/2018   Penicillin allergy 11/06/2018   Hip flexor  tightness 09/29/2018   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   Hyperglycemia 08/02/2018   HLD (hyperlipidemia) 08/02/2018   Vitamin D deficiency 08/02/2018   Preventative health care 07/30/2018   HTN (hypertension) 07/30/2018   Other allergic rhinitis 02/21/2015   Scot Jun, PT, DPT, OCS, ATC 02/24/20  3:47 PM    [facility] [address] [place], [place], [address] Phone: [phone]   Fax:  [phone]  Name: Patrick [name] MRN: [id number] Date of Birth: 24-Apr-1960

## 2020-02-29 ENCOUNTER — Other Ambulatory Visit: Payer: Self-pay

## 2020-03-01 MED ORDER — METHOCARBAMOL 500 MG PO TABS
500.0000 mg | ORAL_TABLET | Freq: Four times a day (QID) | ORAL | 0 refills | Status: DC | PRN
Start: 2020-03-01 — End: 2020-12-08

## 2020-03-02 ENCOUNTER — Other Ambulatory Visit: Payer: Self-pay

## 2020-03-02 ENCOUNTER — Encounter: Payer: Self-pay | Admitting: Rehabilitative and Restorative Service Providers"

## 2020-03-02 ENCOUNTER — Ambulatory Visit (INDEPENDENT_AMBULATORY_CARE_PROVIDER_SITE_OTHER): Payer: 59 | Admitting: Rehabilitative and Restorative Service Providers"

## 2020-03-02 DIAGNOSIS — R2681 Unsteadiness on feet: Secondary | ICD-10-CM

## 2020-03-02 DIAGNOSIS — M79605 Pain in left leg: Secondary | ICD-10-CM

## 2020-03-02 DIAGNOSIS — R2689 Other abnormalities of gait and mobility: Secondary | ICD-10-CM

## 2020-03-02 DIAGNOSIS — R600 Localized edema: Secondary | ICD-10-CM | POA: Diagnosis not present

## 2020-03-02 DIAGNOSIS — M6281 Muscle weakness (generalized): Secondary | ICD-10-CM

## 2020-03-02 DIAGNOSIS — M25662 Stiffness of left knee, not elsewhere classified: Secondary | ICD-10-CM

## 2020-03-02 NOTE — Therapy (Signed)
[facility] [address] [place], [place], [address] Phone: [phone]   Fax:  [phone]  Physical Therapy Treatment  Patient Details  Name: Patrick [name] MRN: [id number] Date of Birth: [birth year] Referring Provider (PT): Meredith Pel, MD   Encounter Date: 03/02/2020   PT End of Session - 03/02/20 1005    Visit Number 7    Number of Visits 16    Date for PT Re-Evaluation 03/18/20    Authorization Type [employer]    PT Start Time 1005    PT Stop Time 1045    PT Time Calculation (min) 40 min    Activity Tolerance Patient tolerated treatment well    Behavior During Therapy [facility] for tasks assessed/performed           Past Medical History:  Diagnosis Date  . Allergy   . Arthritis    left knee  . Dysrhythmia    " [name]"  . Heart murmur   . History of hiatal hernia    4 years ago per patient- no surgery needed  . History of kidney stones   . Hypertension     Past Surgical History:  Procedure Laterality Date  . APPENDECTOMY    . Arthroscopic knee surgeries on both knees    . CHOLECYSTECTOMY    . NASAL SEPTOPLASTY W/ TURBINOPLASTY Bilateral 04/13/2019   Procedure: NASAL SEPTOPLASTY WITH BILATERAL TURBINATE REDUCTION;  Surgeon: Leta Baptist, MD;  Location: [place];  Service: ENT;  Laterality: Bilateral;  . TOTAL KNEE ARTHROPLASTY Left 01/21/2020   Procedure: LEFT TOTAL KNEE ARTHROPLASTY;  Surgeon: Meredith Pel, MD;  Location: [place];  Service: Orthopedics;  Laterality: Left;  Marland Kitchen VASECTOMY      There were no vitals filed for this visit.   Subjective Assessment - 03/02/20 1015    Subjective Pt. reported feeling improvement in lateral hip complaints.  Still having low grade mild complaints from more anterior hip area and quad proximally.   Pt. also mentioned medial knee pain at times with walking.    Pertinent History arthritis, right shoulder bursitis, HTN, hiatal  hernia    Patient Stated Goals to get back to previous activities.    Currently in Pain? Yes    Pain Score 1     Pain Location Hip    Pain Orientation Left    Pain Descriptors / Indicators Aching;Sore    Pain Onset 1 to 4 weeks ago    Pain Onset 1 to 4 weeks ago                             [facility] Adult PT Treatment/Exercise - 03/02/20 0001      Knee/Hip Exercises: Aerobic   Recumbent Bike Lvl 5 12 mins      Knee/Hip Exercises: Machines for Strengthening   Cybex Leg Press SL leg press 3 x 10, performed bilateral 100 lbs      Knee/Hip Exercises: Standing   Lateral Step Up 3 sets;15 reps;Step Height: 6";Both   eccentric lowering focus     Manual Therapy   Manual therapy comments compression to Lt TFL, proximal rectus femoris            Trigger Point Dry Needling - 03/02/20 0001    Consent Given? Yes    Education Handout Provided Previously provided    Muscles Treated Lower Quadrant Rectus femoris  Muscles Treated Back/Hip Tensor fascia lata    Rectus femoris Response Twitch response elicited    Tensor Fascia Lata Response Twitch response elicited                     PT Long Term Goals - 02/08/20 1539      PT LONG TERM GOAL #1   Title FOTO score >/= 70% functional    Time 6    Period Weeks    Status New    Target Date 03/18/20      PT LONG TERM GOAL #2   Title left Knee & hip pain </= 1/10    Time 6    Period Weeks    Status New    Target Date 03/18/20      PT LONG TERM GOAL #3   Title Left knee PROM 0* extension to 125* flexion    Time 6    Period Weeks    Status New    Target Date 03/18/20      PT LONG TERM GOAL #4   Title Left knee AROM seated extension >/= -3* & standing flexion >/= 90*    Time 6    Period Weeks    Status New    Target Date 03/18/20      PT LONG TERM GOAL #5   Title Patient ambulates >500' & negotiates ramps, curbs & stairs single rail alternating pattern independently.    Time 6    Period Weeks     Status New    Target Date 03/18/20                 Plan - 03/02/20 1017    Clinical Impression Statement Positive improvement in lateral hip symptoms c trigger point release.  Performed in TFL and proximal quad today for continued improvement to help improve ambulation tolerance.    Stability/Clinical Decision Making Stable/Uncomplicated    Rehab Potential Good    PT Frequency 3x / week   3x/wk for 4 weeks, then 2x/wk for 2 weeks   PT Duration 6 weeks    PT Treatment/Interventions ADLs/Self Care Home Management;Cryotherapy;Electrical Stimulation;DME Instruction;Gait training;Stair training;Functional mobility training;Therapeutic activities;Therapeutic exercise;Balance training;Neuromuscular re-education;Patient/family education;Manual techniques;Scar mobilization;Passive range of motion;Dry needling;Vasopneumatic Device;Joint Manipulations    PT Next Visit Plan Balance compliant surfance, strengthening.  Progress note c FOTO in next visit or two prior to MD visit.    PT Home Exercise Plan Medbridge Access Code: [email or web address]    Consulted and Agree with Plan of Care Patient           Patient will benefit from skilled therapeutic intervention in order to improve the following deficits and impairments:  Abnormal gait,Decreased balance,Decreased range of motion,Decreased scar mobility,Decreased strength,Difficulty walking,Increased edema,Impaired flexibility,Postural dysfunction,Pain  Visit Diagnosis: Pain in left leg  Stiffness of left knee, not elsewhere classified  Localized edema  Unsteadiness on feet  Other abnormalities of gait and mobility  Muscle weakness (generalized)     Problem List Patient Active Problem List   Diagnosis Date Noted  . S/P total knee arthroplasty, 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  . Recurrent left knee instability 09/01/2019  . Baker's cyst of knee, left 08/12/2019  . 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  . Acute bursitis of right shoulder 02/02/2019   . Coughing 11/06/2018  . Penicillin allergy 11/06/2018  . Hip flexor tightness 09/29/2018  . 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  . Hyperglycemia 08/02/2018  . HLD (hyperlipidemia) 08/02/2018  . Vitamin D deficiency 08/02/2018  . Preventative  health care 07/30/2018  . HTN (hypertension) 07/30/2018  . Other allergic rhinitis 02/21/2015    Scot Jun, PT, DPT, OCS, ATC 03/02/20  10:38 AM    [facility] [address] [place], [place], [address] Phone: [phone]   Fax:  [phone]  Name: Patrick [name] MRN: [id number] Date of Birth: 04-02-1960

## 2020-03-03 ENCOUNTER — Ambulatory Visit (INDEPENDENT_AMBULATORY_CARE_PROVIDER_SITE_OTHER): Payer: 59 | Admitting: Physical Therapy

## 2020-03-03 ENCOUNTER — Encounter: Payer: Self-pay | Admitting: Physical Therapy

## 2020-03-03 DIAGNOSIS — R2681 Unsteadiness on feet: Secondary | ICD-10-CM

## 2020-03-03 DIAGNOSIS — M79605 Pain in left leg: Secondary | ICD-10-CM

## 2020-03-03 DIAGNOSIS — R600 Localized edema: Secondary | ICD-10-CM | POA: Diagnosis not present

## 2020-03-03 DIAGNOSIS — M25662 Stiffness of left knee, not elsewhere classified: Secondary | ICD-10-CM | POA: Diagnosis not present

## 2020-03-03 DIAGNOSIS — R2689 Other abnormalities of gait and mobility: Secondary | ICD-10-CM

## 2020-03-03 DIAGNOSIS — M6281 Muscle weakness (generalized): Secondary | ICD-10-CM

## 2020-03-03 NOTE — Therapy (Signed)
[facility] [address] [place], [place], [address] Phone: [phone]   Fax:  [phone]  Physical Therapy Treatment  Patient Details  Name: Patrick [name] MRN: [id number] Date of Birth: 10/04/1960 Referring Provider (PT): Cammy Copa, MD   Encounter Date: 03/03/2020   PT End of Session - 03/03/20 0752    Visit Number 8    Number of Visits 16    Date for PT Re-Evaluation 03/18/20    Authorization Type [employer]    PT Start Time [phone]    PT Stop Time 0845    PT Time Calculation (min) 49 min    Activity Tolerance Patient tolerated treatment well    Behavior During Therapy [facility] for tasks assessed/performed           Past Medical History:  Diagnosis Date  . Allergy   . Arthritis    left knee  . Dysrhythmia    " [name]"  . Heart murmur   . History of hiatal hernia    4 years ago per patient- no surgery needed  . History of kidney stones   . Hypertension     Past Surgical History:  Procedure Laterality Date  . APPENDECTOMY    . Arthroscopic knee surgeries on both knees    . CHOLECYSTECTOMY    . NASAL SEPTOPLASTY W/ TURBINOPLASTY Bilateral 04/13/2019   Procedure: NASAL SEPTOPLASTY WITH BILATERAL TURBINATE REDUCTION;  Surgeon: Newman Pies, MD;  Location: [facility];  Service: ENT;  Laterality: Bilateral;  . TOTAL KNEE ARTHROPLASTY Left 01/21/2020   Procedure: LEFT TOTAL KNEE ARTHROPLASTY;  Surgeon: Cammy Copa, MD;  Location: [facility];  Service: Orthopedics;  Laterality: Left;  Marland Kitchen VASECTOMY      There were no vitals filed for this visit.   Subjective Assessment - 03/03/20 0753    Subjective He reports some soreness at site of dry needle injection but improved muscle pain.  He improved his sleep to 4-5 hrs instead of only 2.    Pertinent History arthritis, right shoulder bursitis, HTN, hiatal hernia    Patient Stated Goals to get back to previous activities.     Currently in Pain? Yes    Pain Score 1     Pain Location Knee    Pain Orientation Right;Medial    Pain Descriptors / Indicators Discomfort    Pain Type Acute pain;Surgical pain    Pain Onset More than a month ago    Pain Frequency Intermittent    Aggravating Factors  walking or touching spot    Pain Relieving Factors sitting to rest    Pain Onset 1 to 4 weeks ago                             [facility] Adult PT Treatment/Exercise - 03/03/20 0754      Therapeutic Activites    Therapeutic Activities Other Therapeutic Activities    Other Therapeutic Activities Pt goal to return to golf.  Driving foam balls for 5 minutes with cues on completing swing. Pt report mild lateral hip tightness twing that went away after activity.  PT recommended going to driving range. Rest when knee or hip feel fatigued.  Build up tolerance.      Neuro Re-ed    Neuro Re-ed Details  lateral stepping 3 cones x 10 bilateral, SLS c anterior/lateral/medial 3 cone touches contralteral LE x 10 each bilateral  Knee/Hip Exercises: Stretches   Active Hamstring Stretch Left;2 reps;20 seconds    Active Hamstring Stretch Limitations seated forward lean with leg extended    Quad Stretch Left;2 reps;20 seconds    Quad Stretch Limitations supine leg over edge of mat w/strap to flex knee    Gastroc Stretch Left;2 reps;20 seconds    Gastroc Stretch Limitations standing forward wt shift pressing heel towards floor      Knee/Hip Exercises: Aerobic   Tread Mill attempted to use treadmill to work on ability to increase gait speed but medial knee pain returned at 2/10 with gait activity so stopped.  comfortable pace was 2.5 mph for 1 min then increase to 3.29mph for 30 sec back to 2.5 mph 1 min then stopped activity due to knee pain.    Recumbent Bike Seat 8 Lvl 7 10 mins      Knee/Hip Exercises: Machines for Strengthening   Cybex Leg Press SL leg press 3 x 10, performed bilateral 100 lbs      Knee/Hip  Exercises: Standing   Lateral Step Up 3 sets;10 reps;Hand Hold: 1;Step Height: 8";Left   BOSU round side up   Lateral Step Up Limitations compliant surface for improving knee stability    Forward Step Up Left;3 sets;10 reps;Hand Hold: 2;Step Height: 8"   BOSU round side up   Forward Step Up Limitations stepping forward & backward over BOSU with LLE planted in center.  compliant surface to improve knee stability.      Manual Therapy   Manual therapy comments --                       PT Long Term Goals - 03/03/20 0752      PT LONG TERM GOAL #1   Title FOTO score >/= 70% functional    Time 6    Period Weeks    Status On-going    Target Date 03/18/20      PT LONG TERM GOAL #2   Title left Knee & hip pain </= 1/10    Time 6    Period Weeks    Status On-going    Target Date 03/18/20      PT LONG TERM GOAL #3   Title Left knee PROM 0* extension to 125* flexion    Time 6    Period Weeks    Status On-going    Target Date 03/18/20      PT LONG TERM GOAL #4   Title Left knee AROM seated extension >/= -3* & standing flexion >/= 90*    Time 6    Period Weeks    Status On-going    Target Date 03/18/20      PT LONG TERM GOAL #5   Title Patient ambulates >500' & negotiates ramps, curbs & stairs single rail alternating pattern independently.    Time 6    Period Weeks    Status On-going    Target Date 03/18/20                 Plan - 03/03/20 0753    Clinical Impression Statement Patient is on target to meet LTGs.  He may need more PT after next week if his hip & knee pain continue to limit his activities.    Stability/Clinical Decision Making Stable/Uncomplicated    Rehab Potential Good    PT Frequency 3x / week   3x/wk for 4 weeks, then 2x/wk for 2 weeks   PT Duration  6 weeks    PT Treatment/Interventions ADLs/Self Care Home Management;Cryotherapy;Electrical Stimulation;DME Instruction;Gait training;Stair training;Functional mobility training;Therapeutic  activities;Therapeutic exercise;Balance training;Neuromuscular re-education;Patient/family education;Manual techniques;Scar mobilization;Passive range of motion;Dry needling;Vasopneumatic Device;Joint Manipulations    PT Next Visit Plan FOTO & check LTGs prior to MD that immediately after next PT session.    PT Home Exercise Plan Medbridge Access Code: [email or web address]    Consulted and Agree with Plan of Care Patient           Patient will benefit from skilled therapeutic intervention in order to improve the following deficits and impairments:  Abnormal gait,Decreased balance,Decreased range of motion,Decreased scar mobility,Decreased strength,Difficulty walking,Increased edema,Impaired flexibility,Postural dysfunction,Pain  Visit Diagnosis: Pain in left leg  Stiffness of left knee, not elsewhere classified  Localized edema  Unsteadiness on feet  Other abnormalities of gait and mobility  Muscle weakness (generalized)     Problem List Patient Active Problem List   Diagnosis Date Noted  . S/P total knee arthroplasty, 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  . Recurrent left knee instability 09/01/2019  . Baker's cyst of knee, left 08/12/2019  . 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  . Acute bursitis of right shoulder 02/02/2019  . Coughing 11/06/2018  . Penicillin allergy 11/06/2018  . Hip flexor tightness 09/29/2018  . 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  . Hyperglycemia 08/02/2018  . HLD (hyperlipidemia) 08/02/2018  . Vitamin D deficiency 08/02/2018  . Preventative health care 07/30/2018  . HTN (hypertension) 07/30/2018  . Other allergic rhinitis 02/21/2015    Vladimir Faster, PT, DPT 03/03/2020, 10:24 AM  [facility] [address] [place], [place], [address] Phone: [phone]   Fax:  [phone]  Name: Patrick [name] MRN: [id number] Date of Birth: 06/20/1960

## 2020-03-07 ENCOUNTER — Ambulatory Visit: Payer: Self-pay

## 2020-03-07 ENCOUNTER — Ambulatory Visit: Payer: 59 | Admitting: Orthopedic Surgery

## 2020-03-07 ENCOUNTER — Telehealth: Payer: Self-pay

## 2020-03-07 ENCOUNTER — Other Ambulatory Visit: Payer: Self-pay

## 2020-03-07 ENCOUNTER — Ambulatory Visit (INDEPENDENT_AMBULATORY_CARE_PROVIDER_SITE_OTHER): Payer: 59 | Admitting: Rehabilitative and Restorative Service Providers"

## 2020-03-07 ENCOUNTER — Ambulatory Visit (INDEPENDENT_AMBULATORY_CARE_PROVIDER_SITE_OTHER): Payer: 59

## 2020-03-07 DIAGNOSIS — M79605 Pain in left leg: Secondary | ICD-10-CM

## 2020-03-07 DIAGNOSIS — R600 Localized edema: Secondary | ICD-10-CM | POA: Diagnosis not present

## 2020-03-07 DIAGNOSIS — M25552 Pain in left hip: Secondary | ICD-10-CM

## 2020-03-07 DIAGNOSIS — M25662 Stiffness of left knee, not elsewhere classified: Secondary | ICD-10-CM | POA: Diagnosis not present

## 2020-03-07 DIAGNOSIS — M6281 Muscle weakness (generalized): Secondary | ICD-10-CM

## 2020-03-07 DIAGNOSIS — R2681 Unsteadiness on feet: Secondary | ICD-10-CM | POA: Diagnosis not present

## 2020-03-07 DIAGNOSIS — R2689 Other abnormalities of gait and mobility: Secondary | ICD-10-CM

## 2020-03-07 NOTE — Telephone Encounter (Signed)
IC LMVM for patient advised form completed and emailed to him per his request.

## 2020-03-07 NOTE — Therapy (Addendum)
[facility] [address] [place], [place], [address] Phone: [phone]   Fax:  [phone]  Physical Therapy Treatment/Progress Note/Discharge  Patient Details  Name: Patrick [name] MRN: [id number] Date of Birth: 10/31/1960 Referring Provider (PT): Meredith Pel, MD   Encounter Date: 03/07/2020   Progress Note Reporting Period 02/08/2020  to 03/07/2020  See note below for Objective Data and Assessment of Progress/Goals.        PT End of Session - 03/07/20 1306    Visit Number 9    Number of Visits 16    Date for PT Re-Evaluation 03/18/20    Authorization Type [employer]    Progress Note Due on Visit 19    PT Start Time 1258    PT Stop Time 1337    PT Time Calculation (min) 39 min    Activity Tolerance Patient tolerated treatment well    Behavior During Therapy WFL for tasks assessed/performed           Past Medical History:  Diagnosis Date  . Allergy   . Arthritis    left knee  . Dysrhythmia    " [name]"  . Heart murmur   . History of hiatal hernia    4 years ago per patient- no surgery needed  . History of kidney stones   . Hypertension     Past Surgical History:  Procedure Laterality Date  . APPENDECTOMY    . Arthroscopic knee surgeries on both knees    . CHOLECYSTECTOMY    . NASAL SEPTOPLASTY W/ TURBINOPLASTY Bilateral 04/13/2019   Procedure: NASAL SEPTOPLASTY WITH BILATERAL TURBINATE REDUCTION;  Surgeon: Leta Baptist, MD;  Location: [place];  Service: ENT;  Laterality: Bilateral;  . TOTAL KNEE ARTHROPLASTY Left 01/21/2020   Procedure: LEFT TOTAL KNEE ARTHROPLASTY;  Surgeon: Meredith Pel, MD;  Location: [place];  Service: Orthopedics;  Laterality: Left;  Marland Kitchen VASECTOMY      There were no vitals filed for this visit.   Subjective Assessment - 03/07/20 1307    Subjective Pt. indicated 2/10 at worst in medial knee jt.  Pt. indicated deep within hip continued  to cause trouble c prolonged sitting, lying down, up to 5/10.  Better with exercise.    Pertinent History arthritis, right shoulder bursitis, HTN, hiatal hernia    Patient Stated Goals to get back to previous activities.    Currently in Pain? No/denies    Pain Onset More than a month ago    Pain Score --   5/10 at worst   Pain Location Hip    Pain Orientation Left    Pain Descriptors / Indicators Aching    Pain Onset 1 to 4 weeks ago    Aggravating Factors  sitting prolonged, nighttime    Pain Relieving Factors exercise movement              [facility] PT Assessment - 03/07/20 0001      Assessment   Medical Diagnosis Left Total Knee Replacement    Referring Provider (PT) Meredith Pel, MD      Observation/Other Assessments   Focus on Therapeutic Outcomes (FOTO)  FOTO update 85 %      Functional Tests   Functional tests Single leg stance      Single Leg Stance   Comments Lt SLS 30 seconds good control      AROM   Left Knee Extension 0    Left  Knee Flexion 129      Strength   Left Knee Flexion 5/5    Left Knee Extension 5/5                         [facility] Adult PT Treatment/Exercise - 03/07/20 0001      Ambulation/Gait   Gait Comments Independent ambulation community distances unrestricted      Knee/Hip Exercises: Machines for Strengthening   Cybex Knee Extension Lt LE eccentric 25 lbs 3 x 10    Cybex Knee Flexion Lt LE 35 lbs 3 x 10      Knee/Hip Exercises: Standing   Other Standing Knee Exercises 12 lb kettle bell squat 2 x 15    Other Standing Knee Exercises flight of stairs up/down reciprocal gait no HHA                       PT Long Term Goals - 03/07/20 1309      PT LONG TERM GOAL #1   Title FOTO score >/= 70% functional    Time 6    Period Weeks    Status Achieved      PT LONG TERM GOAL #2   Title left Knee & hip pain </= 1/10    Time 6    Period Weeks    Status On-going    Target Date 03/19/19      PT LONG TERM  GOAL #3   Title Left knee PROM 0* extension to 125* flexion    Time 6    Period Weeks    Status Achieved      PT LONG TERM GOAL #4   Title Left knee AROM seated extension >/= -3* & standing flexion >/= 90*    Time 6    Period Weeks    Status Achieved      PT LONG TERM GOAL #5   Title Patient ambulates >500' & negotiates ramps, curbs & stairs single rail alternating pattern independently.    Time 6    Period Weeks    Status Achieved                 Plan - 03/07/20 1309    Clinical Impression Statement Pt. has attended 9 visits overall during course of treatment, reporting 2/10 pain at worst in knee and 5/10 in Lt hip.  See objective data for updated information.  Pt. has demonstrated good progress towareds established goals c TKA related care/progress.  Current chief complaint is Lt hip complaints, primarily noted c static positioning and at night.  Lateral hip complaints have improved in last few weeks, most symptoms indicated deep within joint.  Pt. to return to MD today, follow up and continued skilled PT services indicated based of MD recommendation for hip related pain symptoms.    Stability/Clinical Decision Making Stable/Uncomplicated    Rehab Potential Good    PT Frequency 3x / week   3x/wk for 4 weeks, then 2x/wk for 2 weeks   PT Duration 6 weeks    PT Treatment/Interventions ADLs/Self Care Home Management;Cryotherapy;Electrical Stimulation;DME Instruction;Gait training;Stair training;Functional mobility training;Therapeutic activities;Therapeutic exercise;Balance training;Neuromuscular re-education;Patient/family education;Manual techniques;Scar mobilization;Passive range of motion;Dry needling;Vasopneumatic Device;Joint Manipulations    PT Next Visit Plan Return to MD today.    PT Home Exercise Plan Medbridge Access Code: [email or web address]    Consulted and Agree with Plan of Care Patient           Patient will benefit from  skilled therapeutic intervention in order to  improve the following deficits and impairments:  Abnormal gait,Decreased balance,Decreased range of motion,Decreased scar mobility,Decreased strength,Difficulty walking,Increased edema,Impaired flexibility,Postural dysfunction,Pain  Visit Diagnosis: Pain in left leg  Stiffness of left knee, not elsewhere classified  Localized edema  Unsteadiness on feet  Other abnormalities of gait and mobility  Muscle weakness (generalized)     Problem List Patient Active Problem List   Diagnosis Date Noted  . S/P total knee arthroplasty, 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  . Recurrent left knee instability 09/01/2019  . Baker's cyst of knee, left 08/12/2019  . 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  . Acute bursitis of right shoulder 02/02/2019  . Coughing 11/06/2018  . Penicillin allergy 11/06/2018  . Hip flexor tightness 09/29/2018  . 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  . Hyperglycemia 08/02/2018  . HLD (hyperlipidemia) 08/02/2018  . Vitamin D deficiency 08/02/2018  . Preventative health care 07/30/2018  . HTN (hypertension) 07/30/2018  . Other allergic rhinitis 02/21/2015   Scot Jun, PT, DPT, OCS, ATC 03/07/20  1:40 PM   PHYSICAL THERAPY DISCHARGE SUMMARY  Visits from Start of Care: 9  Current functional level related to goals / functional outcomes: See note   Remaining deficits: See note   Education / Equipment: HEP Plan: Patient agrees to discharge.  Patient goals were met. Patient is being discharged due to being pleased with the current functional level.  ?????    Scot Jun, PT, DPT, OCS, ATC 03/07/20  3:35 PM       [facility] [address] [place], [place], [address] Phone: [phone]   Fax:  [phone]  Name: Patrick [name] MRN: [id number] Date of Birth: [birth year]

## 2020-03-07 NOTE — Progress Notes (Unsigned)
   Naheim Burgen - 60 y.o. male MRN [id number]  Date of birth: 1961-02-16  Office Visit Note: Visit Date: 03/07/2020 PCP: Corwin Levins, MD Referred by: Corwin Levins, MD  Subjective: Chief Complaint  Patient presents with  . Left Knee - Routine Post Op   HPI:  Omare Bilotta is a 60 y.o. male who comes in today For work in Left hip intra-articular injection with fluoroscopic guidance at the request of Dr. Burnard Bunting.  ROS Otherwise per HPI.  Assessment & Plan: Visit Diagnoses:    ICD-10-CM   1. Pain in left hip  M25.552 XR HIP UNILAT W OR W/O PELVIS 2-3 VIEWS LEFT    XR C-ARM NO REPORT    Plan: No additional findings.   Meds & Orders: No orders of the defined types were placed in this encounter.   Orders Placed This Encounter  Procedures  . Large Joint Inj  . XR HIP UNILAT W OR W/O PELVIS 2-3 VIEWS LEFT  . XR C-ARM NO REPORT    Follow-up: No follow-ups on file.   Procedures: Large Joint Inj: L hip joint on 03/07/2020 3:01 PM Indications: diagnostic evaluation and pain Details: 22 G 3.5 in needle, fluoroscopy-guided anterior approach  Arthrogram: No  Medications: 4 mL bupivacaine 0.25 %; 60 mg triamcinolone acetonide 40 MG/ML Outcome: tolerated well, no immediate complications  There was excellent flow of contrast producing a partial arthrogram of the hip. The patient did have relief of symptoms during the anesthetic phase of the injection. Procedure, treatment alternatives, risks and benefits explained, specific risks discussed. Consent was given by the patient. Immediately prior to procedure a time out was called to verify the correct patient, procedure, equipment, support staff and site/side marked as required. Patient was prepped and draped in the usual sterile fashion.          Clinical History: No specialty comments available.     Objective:  VS:  HT:    WT:   BMI:     BP:   HR: bpm  TEMP: ( )  RESP:  Physical Exam   Imaging: No results found.

## 2020-03-08 ENCOUNTER — Encounter: Payer: Self-pay | Admitting: Physical Therapy

## 2020-03-08 ENCOUNTER — Encounter: Payer: Self-pay | Admitting: Orthopedic Surgery

## 2020-03-08 MED ORDER — BUPIVACAINE HCL 0.25 % IJ SOLN
4.0000 mL | INTRAMUSCULAR | Status: AC | PRN
  Administered 2020-03-07: 4 mL via INTRA_ARTICULAR

## 2020-03-08 MED ORDER — TRIAMCINOLONE ACETONIDE 40 MG/ML IJ SUSP
60.0000 mg | INTRAMUSCULAR | Status: AC | PRN
  Administered 2020-03-07: 60 mg via INTRA_ARTICULAR

## 2020-03-08 NOTE — Progress Notes (Signed)
Post-Op Visit Note   Patient: Patrick [name]           Date of Birth: Apr 22, 1960           MRN: [id number] Visit Date: 03/07/2020 PCP: Corwin Levins, MD   Assessment & Plan:  Chief Complaint:  Chief Complaint  Patient presents with  . Left Knee - Routine Post Op   Visit Diagnoses:  1. Pain in left hip     Plan: Patrick [name] is a 60 year old patient left total hip replacement 01/22/2020.  He is doing well with that.  He does report some left hip pain and groin pain which developed shortly after his  knee replacement.  Dry needling helped.  Sitting he has a level 1 out of 10 ache.  Does feel some clicking.  Nighttime symptoms are worse.  He does go to the gym on a daily basis and he is okay with all that.  Does report an achiness in the hip after working out but not during working out.  Advil muscle relaxers and Celebrex help and he takes occasional pain medicine for the nighttime part of this pain.  States his knee is doing well.  Doing bikes weights.  Denies any low back pain.  Also describes pain in that left foot between the third and fourth metatarsal head.  Has a new click with some radiating pain up the leg as well.  On exam he has no groin pain with internal or external Tatian of the leg.  Good hip flexion strength.  No limp.  Does have a palpable click consistent with Morton's neuroma on that left foot.  No nerve root tension signs.  I cannot really detect any coxa Sultan's with provocative labral maneuvers including hip flexion going to hip extension with internal and external rotation.  Radiographs look reasonable in the left hip.  Talked about injection for the Morton's neuroma but for the left hip we will start with injection in case he has some degenerative labral pathology.  If that does not help we can proceed with MRI scanning.  Come back in 4 weeks for decision for or against further imaging of the hip.  Follow-Up Instructions: Return in about 4 weeks (around 04/04/2020).   Orders:   Orders Placed This Encounter  Procedures  . Large Joint Inj: L hip joint  . XR HIP UNILAT W OR W/O PELVIS 2-3 VIEWS LEFT  . XR C-ARM NO REPORT   No orders of the defined types were placed in this encounter.   Imaging: XR C-ARM NO REPORT  Result Date: 03/07/2020 Please see Notes tab for imaging impression.  XR HIP UNILAT W OR W/O PELVIS 2-3 VIEWS LEFT  Result Date: 03/08/2020 AP pelvis lateral left hip reviewed.  No arthritis.  No acute fracture.  Bony architecture intact.  No ossicles.  Normal left hip radiographs   PMFS History: Patient Active Problem List   Diagnosis Date Noted  . S/P total knee arthroplasty, 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  . Recurrent left knee instability 09/01/2019  . Baker's cyst of knee, left 08/12/2019  . 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  . Acute bursitis of right shoulder 02/02/2019  . Coughing 11/06/2018  . Penicillin allergy 11/06/2018  . Hip flexor tightness 09/29/2018  . 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  . Hyperglycemia 08/02/2018  . HLD (hyperlipidemia) 08/02/2018  . Vitamin D deficiency 08/02/2018  . Preventative health care 07/30/2018  . HTN (hypertension) 07/30/2018  . Other allergic rhinitis 02/21/2015   Past Medical History:  Diagnosis Date  . Allergy   . Arthritis    left knee  . Dysrhythmia    " [name]"  . Heart murmur   . History of hiatal hernia    4 years ago per patient- no surgery needed  . History of kidney stones   . Hypertension     Family History  Adopted: Yes    Past Surgical History:  Procedure Laterality Date  . APPENDECTOMY    . Arthroscopic knee surgeries on both knees    . CHOLECYSTECTOMY    . NASAL SEPTOPLASTY W/ TURBINOPLASTY Bilateral 04/13/2019   Procedure: NASAL SEPTOPLASTY WITH BILATERAL TURBINATE REDUCTION;  Surgeon: Newman Pies, MD;  Location: [facility];  Service: ENT;  Laterality: Bilateral;  . TOTAL KNEE ARTHROPLASTY Left 01/21/2020    Procedure: LEFT TOTAL KNEE ARTHROPLASTY;  Surgeon: Cammy Copa, MD;  Location: [facility];  Service: Orthopedics;  Laterality: Left;  Marland Kitchen VASECTOMY     Social History   Occupational History  . Occupation: Teacher, early years/pre, Network engineer, Semi-retired  Tobacco Use  . Smoking status: Former Smoker    Packs/day: 0.00    Types: Cigars  . Smokeless tobacco: Never Used  . Tobacco comment: one cigar every few years  Vaping Use  . Vaping Use: Never used  Substance and Sexual Activity  . Alcohol use: Yes    Alcohol/week: 5.0 standard drinks    Types: 5 Standard drinks or equivalent per week  . Drug use: No  . Sexual activity: Yes

## 2020-03-18 ENCOUNTER — Other Ambulatory Visit: Payer: Self-pay | Admitting: Surgical

## 2020-03-18 NOTE — Telephone Encounter (Signed)
Pls advise.  

## 2020-04-06 ENCOUNTER — Other Ambulatory Visit: Payer: Self-pay | Admitting: Surgical

## 2020-04-06 NOTE — Telephone Encounter (Signed)
Please advise 

## 2020-04-21 ENCOUNTER — Other Ambulatory Visit: Payer: Self-pay | Admitting: Surgical

## 2020-04-21 NOTE — Telephone Encounter (Signed)
Pls advise.  

## 2020-05-07 ENCOUNTER — Other Ambulatory Visit: Payer: Self-pay | Admitting: Surgical

## 2020-05-18 ENCOUNTER — Ambulatory Visit: Payer: 59 | Attending: Internal Medicine

## 2020-05-18 DIAGNOSIS — Z23 Encounter for immunization: Secondary | ICD-10-CM

## 2020-05-19 LAB — NOVEL CORONAVIRUS, NAA: SARS-CoV-2, NAA: NOT DETECTED

## 2020-05-19 LAB — SARS-COV-2, NAA 2 DAY TAT

## 2020-05-19 LAB — SPECIMEN STATUS REPORT

## 2020-06-22 ENCOUNTER — Other Ambulatory Visit: Payer: Self-pay | Admitting: Surgical

## 2020-06-22 NOTE — Telephone Encounter (Signed)
Pls advise.  

## 2020-07-25 ENCOUNTER — Encounter: Payer: Self-pay | Admitting: Orthopedic Surgery

## 2020-07-25 NOTE — Telephone Encounter (Signed)
Probably iliotibial band bursitis.  We can bring him out to the clinic next week to see some x-rays and look at it and maybe get him in therapy for some iliotibial band stretching.  Please call thanks

## 2020-07-26 ENCOUNTER — Other Ambulatory Visit: Payer: Self-pay

## 2020-07-26 ENCOUNTER — Encounter: Payer: Self-pay | Admitting: Internal Medicine

## 2020-07-26 MED ORDER — ROSUVASTATIN CALCIUM 10 MG PO TABS: 10.0000 mg | ORAL_TABLET | Freq: Every day | ORAL | 0 refills | Status: DC

## 2020-08-01 ENCOUNTER — Other Ambulatory Visit: Payer: Self-pay | Admitting: Internal Medicine

## 2020-08-01 NOTE — Telephone Encounter (Signed)
Please refill as per office routine med refill policy (all routine meds refilled for 3 mo or monthly per pt preference up to one year from last visit, then month to month grace period for 3 mo, then further med refills will have to be denied)  

## 2020-08-04 ENCOUNTER — Ambulatory Visit (INDEPENDENT_AMBULATORY_CARE_PROVIDER_SITE_OTHER): Payer: 59

## 2020-08-04 ENCOUNTER — Other Ambulatory Visit: Payer: Self-pay

## 2020-08-04 ENCOUNTER — Ambulatory Visit (INDEPENDENT_AMBULATORY_CARE_PROVIDER_SITE_OTHER): Payer: 59 | Admitting: Orthopedic Surgery

## 2020-08-04 DIAGNOSIS — Z96652 Presence of left artificial knee joint: Secondary | ICD-10-CM

## 2020-08-05 ENCOUNTER — Encounter: Payer: Self-pay | Admitting: Orthopedic Surgery

## 2020-08-05 ENCOUNTER — Other Ambulatory Visit: Payer: Self-pay | Admitting: Surgical

## 2020-08-05 NOTE — Progress Notes (Signed)
Office Visit Note   Patient: Patrick [name]           Date of Birth: 06/21/1960           MRN: [id number] Visit Date: 08/04/2020 Requested by: Biagio Borg, MD [place],  [place] [address] PCP: Biagio Borg, MD  Subjective: Chief Complaint  Patient presents with  . Left Knee - Pain    HPI: Patrick [name] is a 60 year old patient who underwent left total knee replacement 01/21/2020.  He has been doing well.  Reports some pain around the fibular head which is present but not debilitating or particularly aggravating.  Overall the knee is functioning very well.              ROS: All systems reviewed are negative as they relate to the chief complaint within the history of present illness.  Patient denies  fevers or chills.   Assessment & Plan: Visit Diagnoses:  1. Status post total left knee replacement     Plan: Impression is lateral sided knee pain localizing to the fibular head region.  Radiographs intact.  Looked at this also with ultrasound and did not really see any areas of abnormality of the soft tissues in that region of the fibular head.  Particularly around the lateral femoral condyle where I would expect iliotibial band bursitis to be.  Plan right now is for stretching and topical anti-inflammatory.  Follow-up as needed.  No indication for further work-up at this time.  Follow-Up Instructions: No follow-ups on file.   Orders:  Orders Placed This Encounter  Procedures  . XR Knee 1-2 Views Left   No orders of the defined types were placed in this encounter.     Procedures: No procedures performed   Clinical Data: No additional findings.  Objective: Vital Signs: There were no vitals taken for this visit.  Physical Exam:   Constitutional: Patient appears well-developed HEENT:  Head: Normocephalic Eyes:EOM are normal Neck: Normal range of motion Cardiovascular: Normal rate Pulmonary/chest: Effort normal Neurologic: Patient is alert Skin: Skin is  warm Psychiatric: Patient has normal mood and affect    Ortho Exam: Ortho exam demonstrates excellent range of motion of the left knee from 0-1 15.  Collaterals are stable to varus and valgus stress.  Trace effusion but no warmth to the left knee.  Does have tenderness over the fibular head.  No popping or snapping with range of motion extension to flexion at the left knee.  Pedal pulses palpable.  No patellar apprehension.  No particular discrete tenderness over the lateral femoral condyle iliotibial band region.  No other masses lymphadenopathy or skin changes noted in that left knee region.  Specialty Comments:  No specialty comments available.  Imaging: No results found.   PMFS History: Patient Active Problem List   Diagnosis Date Noted  . S/P total knee arthroplasty, 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  . Recurrent left knee instability 09/01/2019  . Baker's cyst of knee, left 08/12/2019  . 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  . Acute bursitis of right shoulder 02/02/2019  . Coughing 11/06/2018  . Penicillin allergy 11/06/2018  . Hip flexor tightness 09/29/2018  . 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  . Hyperglycemia 08/02/2018  . HLD (hyperlipidemia) 08/02/2018  . Vitamin D deficiency 08/02/2018  . Preventative health care 07/30/2018  . HTN (hypertension) 07/30/2018  . Other allergic rhinitis 02/21/2015   Past Medical History:  Diagnosis Date  . Allergy   . Arthritis    left knee  .  Dysrhythmia    " [name]"  . Heart murmur   . History of hiatal hernia    4 years ago per patient- no surgery needed  . History of kidney stones   . Hypertension     Family History  Adopted: Yes    Past Surgical History:  Procedure Laterality Date  . APPENDECTOMY    . Arthroscopic knee surgeries on both knees    . CHOLECYSTECTOMY    . NASAL SEPTOPLASTY W/ TURBINOPLASTY Bilateral 04/13/2019   Procedure: NASAL SEPTOPLASTY WITH BILATERAL TURBINATE  REDUCTION;  Surgeon: Leta Baptist, MD;  Location: [place];  Service: ENT;  Laterality: Bilateral;  . TOTAL KNEE ARTHROPLASTY Left 01/21/2020   Procedure: LEFT TOTAL KNEE ARTHROPLASTY;  Surgeon: Meredith Pel, MD;  Location: [place];  Service: Orthopedics;  Laterality: Left;  Marland Kitchen VASECTOMY     Social History   Occupational History  . Occupation: Print production planner, Financial controller, Semi-retired  Tobacco Use  . Smoking status: Former Smoker    Packs/day: 0.00    Types: Cigars  . Smokeless tobacco: Never Used  . Tobacco comment: one cigar every few years  Vaping Use  . Vaping Use: Never used  Substance and Sexual Activity  . Alcohol use: Yes    Alcohol/week: 5.0 standard drinks    Types: 5 Standard drinks or equivalent per week  . Drug use: No  . Sexual activity: Yes

## 2020-08-15 ENCOUNTER — Other Ambulatory Visit: Payer: Self-pay

## 2020-08-15 ENCOUNTER — Other Ambulatory Visit (INDEPENDENT_AMBULATORY_CARE_PROVIDER_SITE_OTHER): Payer: 59

## 2020-08-15 DIAGNOSIS — R739 Hyperglycemia, unspecified: Secondary | ICD-10-CM | POA: Diagnosis not present

## 2020-08-15 DIAGNOSIS — Z0000 Encounter for general adult medical examination without abnormal findings: Secondary | ICD-10-CM | POA: Diagnosis not present

## 2020-08-15 LAB — BASIC METABOLIC PANEL
BUN: 31 mg/dL — ABNORMAL HIGH (ref 6–23)
CO2: 28 mEq/L (ref 19–32)
Calcium: 9.2 mg/dL (ref 8.4–10.5)
Chloride: 106 mEq/L (ref 96–112)
Creatinine, Ser: 0.87 mg/dL (ref 0.40–1.50)
GFR: 94.5 mL/min (ref >60.00)
Glucose, Bld: 94 mg/dL (ref 70–99)
Potassium: 5 mEq/L (ref 3.5–5.1)
Sodium: 141 mEq/L (ref 135–145)

## 2020-08-15 LAB — CBC WITH DIFFERENTIAL/PLATELET
Basophils Absolute: 0.1 10*3/uL (ref 0.0–0.1)
Basophils Relative: 1.1 % (ref 0.0–3.0)
Eosinophils Absolute: 0.3 10*3/uL (ref 0.0–0.7)
Eosinophils Relative: 5.1 % — ABNORMAL HIGH (ref 0.0–5.0)
HCT: 45.2 % (ref 39.0–52.0)
Hemoglobin: 15.2 g/dL (ref 13.0–17.0)
Lymphocytes Relative: 28.9 % (ref 12.0–46.0)
Lymphs Abs: 1.7 10*3/uL (ref 0.7–4.0)
MCHC: 33.7 g/dL (ref 30.0–36.0)
MCV: 89.1 fl (ref 78.0–100.0)
Monocytes Absolute: 0.6 10*3/uL (ref 0.1–1.0)
Monocytes Relative: 10 % (ref 3.0–12.0)
Neutro Abs: 3.3 10*3/uL (ref 1.4–7.7)
Neutrophils Relative %: 54.9 % (ref 43.0–77.0)
Platelets: 207 10*3/uL (ref 150.0–400.0)
RBC: 5.08 Mil/uL (ref 4.22–5.81)
RDW: 13 % (ref 11.5–15.5)
WBC: 6 10*3/uL (ref 4.0–10.5)

## 2020-08-15 LAB — URINALYSIS, ROUTINE W REFLEX MICROSCOPIC
Bilirubin Urine: NEGATIVE
Hgb urine dipstick: NEGATIVE
Ketones, ur: NEGATIVE
Leukocytes,Ua: NEGATIVE
Nitrite: NEGATIVE
RBC / HPF: NONE SEEN (ref 0–?)
Specific Gravity, Urine: 1.025 (ref 1.000–1.030)
Total Protein, Urine: NEGATIVE
Urine Glucose: NEGATIVE
Urobilinogen, UA: 0.2 (ref 0.0–1.0)
pH: 6 (ref 5.0–8.0)

## 2020-08-15 LAB — HEPATIC FUNCTION PANEL
ALT: 27 U/L (ref 0–53)
AST: 22 U/L (ref 0–37)
Albumin: 4.5 g/dL (ref 3.5–5.2)
Alkaline Phosphatase: 55 U/L (ref 39–117)
Bilirubin, Direct: 0.1 mg/dL (ref 0.0–0.3)
Total Bilirubin: 0.7 mg/dL (ref 0.2–1.2)
Total Protein: 6.7 g/dL (ref 6.0–8.3)

## 2020-08-15 LAB — LIPID PANEL
Cholesterol: 126 mg/dL (ref 0–200)
HDL: 50.2 mg/dL (ref >39.00)
LDL Cholesterol: 53 mg/dL (ref 0–99)
NonHDL: 75.71
Total CHOL/HDL Ratio: 3
Triglycerides: 113 mg/dL (ref 0.0–149.0)
VLDL: 22.6 mg/dL (ref 0.0–40.0)

## 2020-08-15 LAB — PSA: PSA: 0.49 ng/mL (ref 0.10–4.00)

## 2020-08-15 LAB — HEMOGLOBIN A1C: Hgb A1c MFr Bld: 5.4 % (ref 4.6–6.5)

## 2020-08-15 LAB — TSH: TSH: 2.14 u[IU]/mL (ref 0.35–4.50)

## 2020-08-18 ENCOUNTER — Encounter: Payer: Self-pay | Admitting: Internal Medicine

## 2020-08-18 ENCOUNTER — Ambulatory Visit (INDEPENDENT_AMBULATORY_CARE_PROVIDER_SITE_OTHER): Payer: 59 | Admitting: Internal Medicine

## 2020-08-18 ENCOUNTER — Other Ambulatory Visit: Payer: Self-pay

## 2020-08-18 VITALS — BP 122/80 | HR 92 | Temp 98.5°F | Ht 70.0 in | Wt 229.8 lb

## 2020-08-18 DIAGNOSIS — E559 Vitamin D deficiency, unspecified: Secondary | ICD-10-CM

## 2020-08-18 DIAGNOSIS — L723 Sebaceous cyst: Secondary | ICD-10-CM | POA: Insufficient documentation

## 2020-08-18 DIAGNOSIS — D225 Melanocytic nevi of trunk: Secondary | ICD-10-CM | POA: Insufficient documentation

## 2020-08-18 DIAGNOSIS — Z23 Encounter for immunization: Secondary | ICD-10-CM | POA: Diagnosis not present

## 2020-08-18 DIAGNOSIS — R739 Hyperglycemia, unspecified: Secondary | ICD-10-CM

## 2020-08-18 DIAGNOSIS — I10 Essential (primary) hypertension: Secondary | ICD-10-CM | POA: Diagnosis not present

## 2020-08-18 DIAGNOSIS — Z0000 Encounter for general adult medical examination without abnormal findings: Secondary | ICD-10-CM

## 2020-08-18 DIAGNOSIS — E785 Hyperlipidemia, unspecified: Secondary | ICD-10-CM

## 2020-08-18 DIAGNOSIS — Z0001 Encounter for general adult medical examination with abnormal findings: Secondary | ICD-10-CM

## 2020-08-18 DIAGNOSIS — L821 Other seborrheic keratosis: Secondary | ICD-10-CM | POA: Insufficient documentation

## 2020-08-18 NOTE — Progress Notes (Signed)
Patient ID: Patrick [name], male   DOB: [birth year], 60 y.o.   MRN: [id number]         Chief Complaint:: wellness exam and hld, htn, hyperglycemia, vit d deficiency       HPI:  Patrick [name] is a 60 y.o. male here for wellness exam; due for prevnar, declines shingrix, and covid booster, o/w up to date with preventive referrals and immunizations                        Also not taking Vit D.  Had covid infection jan 2021. Pt denies chest pain, increased sob or doe, wheezing, orthopnea, PND, increased LE swelling, palpitations, dizziness or syncope.   Pt denies polydipsia, polyuria, or new focal neuro s/s.   Pt denies fever, wt loss, night sweats, loss of appetite, or other constitutional symptoms  Willing for cardac ct score.  No other new complaints      Wt Readings from Last 3 Encounters:  08/18/20 229 lb 12.8 oz (104.2 kg)  01/21/20 220 lb (99.8 kg)  01/15/20 224 lb 3.2 oz (101.7 kg)   BP Readings from Last 3 Encounters:  08/18/20 122/80  01/22/20 118/83  01/15/20 (!) 151/85   Immunization History  Administered Date(s) Administered   Moderna Sars-Covid-2 Vaccination 05/20/2019, 06/17/2019, 01/25/2020   Pneumococcal Conjugate-13 08/18/2020   Tdap 02/21/2015   There are no preventive care reminders to display for this patient.     Past Medical History:  Diagnosis Date   Allergy    Arthritis    left knee   Dysrhythmia    " [name]"   Heart murmur    History of hiatal hernia    4 years ago per patient- no surgery needed   History of kidney stones    Hypertension    Past Surgical History:  Procedure Laterality Date   APPENDECTOMY     Arthroscopic knee surgeries on both knees     CHOLECYSTECTOMY     NASAL SEPTOPLASTY W/ TURBINOPLASTY Bilateral 04/13/2019   Procedure: NASAL SEPTOPLASTY WITH BILATERAL TURBINATE REDUCTION;  Surgeon: Leta Baptist, MD;  Location: [facility];  Service: ENT;  Laterality: Bilateral;   TOTAL KNEE  ARTHROPLASTY Left 01/21/2020   Procedure: LEFT TOTAL KNEE ARTHROPLASTY;  Surgeon: Meredith Pel, MD;  Location: [place];  Service: Orthopedics;  Laterality: Left;   VASECTOMY      reports that he has quit smoking. His smoking use included cigars. He has never used smokeless tobacco. He reports current alcohol use of about 5.0 standard drinks of alcohol per week. He reports that he does not use drugs. family history is not on file. He was adopted. Allergies  Allergen Reactions   Penicillins Hives    Rash around neck  Has patient had a PCN reaction causing immediate rash, facial/tongue/throat swelling, SOB or lightheadedness with hypotension: Yes Has patient had a PCN reaction causing severe rash involving mucus membranes or skin necrosis: No Has patient had a PCN reaction that required hospitalization: No Has patient had a PCN reaction occurring within the last 10 years: No If all of the above answers are "NO", then may proceed with Cephalosporin use.   Penicillin G Other (See Comments)   Current Outpatient Medications on File Prior to Visit  Medication Sig Dispense Refill   aspirin 81 MG EC tablet Take 81 mg by mouth daily.      cetirizine (ZYRTEC) 10 MG  tablet Take 10 mg by mouth at bedtime.      cholecalciferol (VITAMIN D3) 25 MCG (1000 UNIT) tablet Take 1,000 Units by mouth daily.      Ginger, Zingiber officinalis, (GINGER ROOT) 550 MG CAPS Take 550 mg by mouth daily.      Misc Natural Products (TART CHERRY ADVANCED) CAPS Take 1 capsule by mouth daily.      Multiple Vitamin (MULTIVITAMIN WITH MINERALS) TABS tablet Take 1 tablet by mouth daily. Men's Over 50      rosuvastatin (CRESTOR) 10 MG tablet TAKE ONE TABLET BY MOUTH DAILY 30 tablet 0   telmisartan (MICARDIS) 80 MG tablet Take 1 tablet (80 mg total) by mouth daily. 90 tablet 3   Turmeric Curcumin 500 MG CAPS Take 500 mg by mouth daily.      celecoxib (CELEBREX) 200 MG capsule TAKE ONE CAPSULE BY MOUTH TWICE A DAY (Patient not  taking: Reported on 08/18/2020) 30 capsule 2   Krill Oil 300 MG CAPS  (Patient not taking: Reported on 08/18/2020)     methocarbamol (ROBAXIN) 500 MG tablet Take 1 tablet (500 mg total) by mouth every 6 (six) hours as needed for muscle spasms. (Patient not taking: Reported on 08/18/2020) 30 tablet 0   oxyCODONE (OXY IR/ROXICODONE) 5 MG immediate release tablet Take 1 tablet (5 mg total) by mouth every 6 (six) hours as needed for moderate pain (pain score 4-6). (Patient not taking: Reported on 08/18/2020) 30 tablet 0   No current facility-administered medications on file prior to visit.        ROS:  All others reviewed and negative.  Objective        PE:  BP 122/80 (BP Location: Left Arm, Patient Position: Sitting, Cuff Size: Large)   Pulse 92   Temp 98.5 F (36.9 C) (Oral)   Ht 5\' 10"  (1.778 m)   Wt 229 lb 12.8 oz (104.2 kg)   SpO2 96%   BMI 32.97 kg/m                 Constitutional: Pt appears in NAD               HENT: Head: NCAT.                Right Ear: External ear normal.                 Left Ear: External ear normal.                Eyes: . Pupils are equal, round, and reactive to light. Conjunctivae and EOM are normal               Nose: without d/c or deformity               Neck: Neck supple. Gross normal ROM               Cardiovascular: Normal rate and regular rhythm.                 Pulmonary/Chest: Effort normal and breath sounds without rales or wheezing.                Abd:  Soft, NT, ND, + BS, no organomegaly               Neurological: Pt is alert. At baseline orientation, motor grossly intact               Skin: Skin is warm. No rashes, no  other new lesions, LE edema - none               Psychiatric: Pt behavior is normal without agitation   Micro: none  Cardiac tracings I have personally interpreted today:  none  Pertinent Radiological findings (summarize): none   Lab Results  Component Value Date   WBC 6.0 08/15/2020   HGB 15.2 08/15/2020   HCT 45.2  08/15/2020   PLT 207.0 08/15/2020   GLUCOSE 94 08/15/2020   CHOL 126 08/15/2020   TRIG 113.0 08/15/2020   HDL 50.20 08/15/2020   LDLCALC 53 08/15/2020   ALT 27 08/15/2020   AST 22 08/15/2020   NA 141 08/15/2020   K 5.0 08/15/2020   CL 106 08/15/2020   CREATININE 0.87 08/15/2020   BUN 31 (H) 08/15/2020   CO2 28 08/15/2020   TSH 2.14 08/15/2020   PSA 0.49 08/15/2020   HGBA1C 5.4 08/15/2020   Assessment/Plan:  Zebulon Gantt is a 60 y.o. White or Caucasian [1] male with  has a past medical history of Allergy, Arthritis, Dysrhythmia, Heart murmur, History of hiatal hernia, History of kidney stones, and Hypertension.  Vitamin D deficiency Last vitamin D Lab Results  Component Value Date   VD25OH 25.94 (L) 08/01/2018   Low, to start oral replacement   Encounter for well adult exam with abnormal findings Age and sex appropriate education and counseling updated with regular exercise and diet Referrals for preventative services - none needed Immunizations addressed - for prevnar today, o/w declines shingrix and covid Smoking counseling  - none needed Evidence for depression or other mood disorder - none significant Most recent labs reviewed. I have personally reviewed and have noted: 1) the patient's medical and social history 2) The patient's current medications and supplements 3) The patient's height, weight, and BMI have been recorded in the chart   HTN (hypertension) BP Readings from Last 3 Encounters:  08/18/20 122/80  01/22/20 118/83  01/15/20 (!) 151/85   Stable, pt to continue medical treatment micardis   Hyperglycemia Lab Results  Component Value Date   HGBA1C 5.4 08/15/2020   Stable, pt to continue current medical treatment  - diet   HLD (hyperlipidemia) Lab Results  Component Value Date   LDLCALC 53 08/15/2020   Stable, pt to continue current statin crestor 10  Followup: Return in about 1 year (around 08/18/2021).  Cathlean Cower, MD 08/21/2020 9:22  PM [facility]

## 2020-08-18 NOTE — Assessment & Plan Note (Signed)
Last vitamin D Lab Results  Component Value Date   VD25OH 25.94 (L) 08/01/2018   Low, to start oral replacement

## 2020-08-18 NOTE — Patient Instructions (Signed)
You had the Prevnar 13 pneumonia shot today  Please check on the Shingrix cost with your insurance, and if covered you can come here or go to [facility] to have this done  We have discussed the Cardiac CT Score test to measure the calcification level (if any) in your heart arteries.  This test has been ordered in our [facility], so please call  CT directly, as they prefer this, at [phone] to be scheduled.  Please continue all other medications as before, and refills have been done if requested.  Please have the pharmacy call with any other refills you may need.  Please continue your efforts at being more active, low cholesterol diet, and weight control.  You are otherwise up to date with prevention measures today.  Please keep your appointments with your specialists as you may have planned  Please make an Appointment to return for your 1 year visit, or sooner if needed, with Lab testing by Appointment as well, to be done about 3-5 days before at the [facility] (so this is for TWO appointments - please see the scheduling desk as you leave)   Due to the ongoing Covid 19 pandemic, our lab now requires an appointment for any labs done at our office.  If you need labs done and do not have an appointment, please call our office ahead of time to schedule before presenting to the lab for your testing.

## 2020-08-21 ENCOUNTER — Encounter: Payer: Self-pay | Admitting: Internal Medicine

## 2020-08-21 NOTE — Assessment & Plan Note (Signed)
Age and sex appropriate education and counseling updated with regular exercise and diet Referrals for preventative services - none needed Immunizations addressed - for prevnar today, o/w declines shingrix and covid Smoking counseling  - none needed Evidence for depression or other mood disorder - none significant Most recent labs reviewed. I have personally reviewed and have noted: 1) the patient's medical and social history 2) The patient's current medications and supplements 3) The patient's height, weight, and BMI have been recorded in the chart

## 2020-08-21 NOTE — Assessment & Plan Note (Signed)
Lab Results  Component Value Date   HGBA1C 5.4 08/15/2020   Stable, pt to continue current medical treatment  - diet  

## 2020-08-21 NOTE — Assessment & Plan Note (Signed)
Lab Results  Component Value Date   LDLCALC 53 08/15/2020   Stable, pt to continue current statin crestor 10

## 2020-08-21 NOTE — Assessment & Plan Note (Signed)
BP Readings from Last 3 Encounters:  08/18/20 122/80  01/22/20 118/83  01/15/20 (!) 151/85   Stable, pt to continue medical treatment micardis

## 2020-08-23 ENCOUNTER — Other Ambulatory Visit: Payer: Self-pay | Admitting: Internal Medicine

## 2020-08-24 MED ORDER — ROSUVASTATIN CALCIUM 10 MG PO TABS: 10.0000 mg | ORAL_TABLET | Freq: Every day | ORAL | 3 refills | Status: DC

## 2020-09-22 ENCOUNTER — Encounter: Payer: Self-pay | Admitting: Internal Medicine

## 2020-09-22 ENCOUNTER — Other Ambulatory Visit: Payer: Self-pay

## 2020-09-22 ENCOUNTER — Ambulatory Visit (INDEPENDENT_AMBULATORY_CARE_PROVIDER_SITE_OTHER)
Admission: RE | Admit: 2020-09-22 | Discharge: 2020-09-22 | Disposition: A | Discharge status: Home / Self Care | Payer: Self-pay | Source: Ambulatory Visit | Attending: Internal Medicine | Admitting: Internal Medicine

## 2020-09-22 ENCOUNTER — Other Ambulatory Visit: Payer: Self-pay | Admitting: Internal Medicine

## 2020-09-22 DIAGNOSIS — R931 Abnormal findings on diagnostic imaging of heart and coronary circulation: Secondary | ICD-10-CM

## 2020-09-22 DIAGNOSIS — I10 Essential (primary) hypertension: Secondary | ICD-10-CM

## 2020-11-29 ENCOUNTER — Other Ambulatory Visit: Payer: Self-pay | Admitting: Internal Medicine

## 2020-11-29 NOTE — Telephone Encounter (Signed)
Please refill as per office routine med refill policy (all routine meds to be refilled for 3 mo or monthly (per pt preference) up to one year from last visit, then month to month grace period for 3 mo, then further med refills will have to be denied) ? ?

## 2020-12-08 ENCOUNTER — Ambulatory Visit (INDEPENDENT_AMBULATORY_CARE_PROVIDER_SITE_OTHER): Payer: 59 | Admitting: Cardiology

## 2020-12-08 ENCOUNTER — Encounter (HOSPITAL_BASED_OUTPATIENT_CLINIC_OR_DEPARTMENT_OTHER): Payer: Self-pay | Admitting: Cardiology

## 2020-12-08 ENCOUNTER — Other Ambulatory Visit: Payer: Self-pay

## 2020-12-08 VITALS — BP 130/80 | HR 81 | Ht 70.0 in | Wt 228.0 lb

## 2020-12-08 DIAGNOSIS — I2584 Coronary atherosclerosis due to calcified coronary lesion: Secondary | ICD-10-CM | POA: Diagnosis not present

## 2020-12-08 DIAGNOSIS — E7800 Pure hypercholesterolemia, unspecified: Secondary | ICD-10-CM | POA: Diagnosis not present

## 2020-12-08 DIAGNOSIS — I10 Essential (primary) hypertension: Secondary | ICD-10-CM | POA: Diagnosis not present

## 2020-12-08 DIAGNOSIS — I2510 Atherosclerotic heart disease of native coronary artery without angina pectoris: Secondary | ICD-10-CM | POA: Diagnosis not present

## 2020-12-08 MED ORDER — ROSUVASTATIN CALCIUM 20 MG PO TABS: 20.0000 mg | ORAL_TABLET | Freq: Every day | ORAL | 3 refills | Status: DC

## 2020-12-08 NOTE — Assessment & Plan Note (Signed)
Showed him images of his CT scan with LAD and RCA calcification.  His wife was present.  Calcium score 158.  77 percentile.  Based upon his score and lack of symptoms, no further cardiac testing is warranted.  I would like to increase his Crestor from 10 to 20 mg daily.  This is high intensity statin dose with known coronary artery disease.  He has not had any myalgias or any issues with his Crestor 10 I do not envision him having any issues with 20. He will let me know if something develops.  Prior ALT is normal.  We will send him in a prescription.  He is already taking aspirin 81 mg.  No bleeding issues.  His LDL cholesterol previously checked was in the 50s.  This is excellent.

## 2020-12-08 NOTE — Patient Instructions (Signed)
Medication Instructions:  Please increase your Crestor to 20 mg a day. Continue all other medications as listed.  *If you need a refill on your cardiac medications before your next appointment, please call your pharmacy*  Follow-Up: At [facility], you and your health needs are our priority.  As part of our continuing mission to provide you with exceptional heart care, we have created designated Provider Care Teams.  These Care Teams include your primary Cardiologist (physician) and Advanced Practice Providers (APPs -  Physician Assistants and Nurse Practitioners) who all work together to provide you with the care you need, when you need it.  We recommend signing up for the patient portal called "MyChart".  Sign up information is provided on this After Visit Summary.  MyChart is used to connect with patients for Virtual Visits (Telemedicine).  Patients are able to view lab/test results, encounter notes, upcoming appointments, etc.  Non-urgent messages can be sent to your provider as well.   To learn more about what you can do with MyChart, go to [email or web address].    Your next appointment:   1 year(s)  The format for your next appointment:   In Person  Provider:   Candee Furbish, MD  Thank you for choosing [facility]!!

## 2020-12-08 NOTE — Progress Notes (Signed)
Cardiology Office Note:    Date:  12/08/2020   ID:  Patrick [name], DOB 1960-10-08, MRN [id number]  PCP:  Patrick Borg, MD   [facility] Providers Cardiologist:  None     Referring MD: Patrick Borg, MD   History of Present Illness:    Patrick [name] is a 60 y.o. male here for the evaluation of elevated calcium score (158, 77th percentile) at the request of Dr. Jenny [name].   Previous Covid infection 03/2019.  He was last seen by Dr. Jenny [name] 08/18/2020 for a wellness visit and was doing well.  Today: He is accompanied by a family member. Overall he is feeling well and denies any significant symptoms.  About 10 years ago, he was noticing flutters that caused coughing. A thorough Cardiovascular work-up revealed a naturally tachycardic heartbeat with occasional extra beats. It was also noted he drank too much caffeine. He cut back on caffeine and was doing much better.  Two years ago Crestor was started. Over the last 2 years he lost some weight and his labs had improved overall. Before discontinuing any cholesterol medication, it was recommended to have the calcium score performed.   Generally he is an active person. He regularly works out at [employer], including cardio and weight lifting. His diet typically includes salads, green beans, tomatoes, as well as high-protein, low-carb foods.  He was adopted, and recently found his biological mother. His maternal side may have a hx of cardiovascular disease more prominent in females. Biological paternal hx is unknown.  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  Stressful.  He denies any palpitations, chest pain, or shortness of breath. No lightheadedness, headaches, syncope, orthopnea, or PND. Also has no lower extremity edema or exertional symptoms.  Past Medical History:  Diagnosis Date   Allergy    Arthritis    left knee   Dysrhythmia    " [name]"   Heart murmur     History of hiatal hernia    4 years ago per patient- no surgery needed   History of kidney stones    Hypertension     Past Surgical History:  Procedure Laterality Date   APPENDECTOMY     Arthroscopic knee surgeries on both knees     CHOLECYSTECTOMY     NASAL SEPTOPLASTY W/ TURBINOPLASTY Bilateral 04/13/2019   Procedure: NASAL SEPTOPLASTY WITH BILATERAL TURBINATE REDUCTION;  Surgeon: Patrick Baptist, MD;  Location: [place];  Service: ENT;  Laterality: Bilateral;   TOTAL KNEE ARTHROPLASTY Left 01/21/2020   Procedure: LEFT TOTAL KNEE ARTHROPLASTY;  Surgeon: Patrick Pel, MD;  Location: [facility];  Service: Orthopedics;  Laterality: Left;   VASECTOMY      Current Medications: Current Meds  Medication Sig   aspirin 81 MG EC tablet Take 81 mg by mouth daily.    cetirizine (ZYRTEC) 10 MG tablet Take 10 mg by mouth at bedtime.    cholecalciferol (VITAMIN D3) 25 MCG (1000 UNIT) tablet Take 1,000 Units by mouth daily.    Ginger, Zingiber officinalis, (GINGER ROOT) 550 MG CAPS Take 550 mg by mouth daily.    Krill Oil 300 MG CAPS    Misc Natural Products (TART CHERRY ADVANCED) CAPS Take 1 capsule by mouth daily.    Multiple Vitamin (MULTIVITAMIN WITH MINERALS) TABS tablet Take 1 tablet by mouth daily. Men's Over 50    rosuvastatin (CRESTOR) 20 MG tablet Take 1 tablet (  20 mg total) by mouth daily.   telmisartan (MICARDIS) 80 MG tablet TAKE ONE TABLET BY MOUTH DAILY   Turmeric Curcumin 500 MG CAPS Take 500 mg by mouth daily.    [DISCONTINUED] rosuvastatin (CRESTOR) 10 MG tablet Take 1 tablet (10 mg total) by mouth daily.     Allergies:   Penicillins and Penicillin g   Social History   Socioeconomic History   Marital status: Married    Spouse name: Not on file   Number of children: Not on file   Years of education: Not on file   Highest education level: Not on file  Occupational History   Occupation: Print production planner, Financial controller, Semi-retired  Tobacco Use   Smoking status: Former     Packs/day: 0.00    Types: Cigars, Cigarettes   Smokeless tobacco: Never   Tobacco comments:    one cigar every few years  Vaping Use   Vaping Use: Never used  Substance and Sexual Activity   Alcohol use: Yes    Alcohol/week: 5.0 standard drinks    Types: 5 Standard drinks or equivalent per week   Drug use: No   Sexual activity: Yes  Other Topics Concern   Not on file  Social History Narrative   Married.   Education: [employer]   Exercise: Yes   Social Determinants of Radio broadcast assistant Strain: Not on file  Food Insecurity: Not on file  Transportation Needs: Not on file  Physical Activity: Not on file  Stress: Not on file  Social Connections: Not on file     Family History: The patient's family history is not on file. He was adopted.  ROS:   Please see the history of present illness.    All other systems reviewed and are negative.  EKGs/Labs/Other Studies Reviewed:    The following studies were reviewed today:  Coronary Calcium Score 09/22/2020: FINDINGS: Coronary arteries: Normal origins.   Coronary Calcium Score:   Left main: 0   Left anterior descending artery: 96   Left circumflex artery: 0   Right coronary artery: 62   Total: 158   Percentile: 77   Pericardium: Normal.   Aorta: Normal caliber of ascending aorta. No aortic atherosclerosis noted.   Non-cardiac: See separate report from [facility].   IMPRESSION: Coronary calcium score of 158. This was 77th percentile for age-, race-, and sex-matched controls.  EKG:  EKG is personally reviewed and interpreted. 12/08/2020: Sinus rhythm. Rate 81 bpm.  Recent Labs: 08/15/2020: ALT 27; BUN 31; Creatinine, Ser 0.87; Hemoglobin 15.2; Platelets 207.0; Potassium 5.0; Sodium 141; TSH 2.14   Recent Lipid Panel    Component Value Date/Time   CHOL 126 08/15/2020 0857   CHOL 187 05/07/2017 1103   TRIG 113.0 08/15/2020 0857   HDL 50.20 08/15/2020 0857   HDL 45 05/07/2017 1103    CHOLHDL 3 08/15/2020 0857   VLDL 22.6 08/15/2020 0857   LDLCALC 53 08/15/2020 0857   LDLCALC 111 (H) 05/07/2017 1103        Physical Exam:    VS:  BP 130/80 (BP Location: Left Arm, Patient Position: Sitting, Cuff Size: Normal)   Pulse 81   Ht 5\' 10"  (1.778 m)   Wt 228 lb (103.4 kg)   SpO2 97%   BMI 32.71 kg/m     Wt Readings from Last 3 Encounters:  12/08/20 228 lb (103.4 kg)  08/18/20 229 lb 12.8 oz (104.2 kg)  01/21/20 220 lb (99.8 kg)     GEN: Well  nourished, well developed in no acute distress HEENT: Normal NECK: No JVD; No carotid bruits LYMPHATICS: No lymphadenopathy CARDIAC: RRR, no murmurs, rubs, gallops RESPIRATORY:  Clear to auscultation without rales, wheezing or rhonchi  ABDOMEN: Soft, non-tender, non-distended MUSCULOSKELETAL:  No edema; No deformity  SKIN: Warm and dry NEUROLOGIC:  Alert and oriented x 3 PSYCHIATRIC:  Normal affect   ASSESSMENT:    1. Hypertension, unspecified type   2. Coronary artery calcification   3. Pure hypercholesterolemia    PLAN:    In order of problems listed above: Coronary artery calcification Showed him images of his CT scan with LAD and RCA calcification.  His wife was present.  Calcium score 158.  77 percentile.  Based upon his score and lack of symptoms, no further cardiac testing is warranted.  I would like to increase his Crestor from 10 to 20 mg daily.  This is high intensity statin dose with known coronary artery disease.  He has not had any myalgias or any issues with his Crestor 10 I do not envision him having any issues with 20. He will let me know if something develops.  Prior ALT is normal.  We will send him in a prescription.  He is already taking aspirin 81 mg.  No bleeding issues.  His LDL cholesterol previously checked was in the 50s.  This is excellent.  HTN (hypertension) Continue with diet, exercise, telmisartan 80 mg.  Doing very well.  He has had 20 to 30 pound weight loss.  Keto like diet.  HLD  (hyperlipidemia) As noted above, changing Crestor from 10-20 given his known coronary artery disease/calcification.  High intensity dose.     Follow-up: 1 year per patient preference.  Medication Adjustments/Labs and Tests Ordered: Current medicines are reviewed at length with the patient today.  Concerns regarding medicines are outlined above.   Orders Placed This Encounter  Procedures   EKG 12-Lead    Meds ordered this encounter  Medications   rosuvastatin (CRESTOR) 20 MG tablet    Sig: Take 1 tablet (20 mg total) by mouth daily.    Dispense:  90 tablet    Refill:  3    Patient Instructions  Medication Instructions:  Please increase your Crestor to 20 mg a day. Continue all other medications as listed.  *If you need a refill on your cardiac medications before your next appointment, please call your pharmacy*  Follow-Up: At [facility], you and your health needs are our priority.  As part of our continuing mission to provide you with exceptional heart care, we have created designated Provider Care Teams.  These Care Teams include your primary Cardiologist (physician) and Advanced Practice Providers (APPs -  Physician Assistants and Nurse Practitioners) who all work together to provide you with the care you need, when you need it.  We recommend signing up for the patient portal called "MyChart".  Sign up information is provided on this After Visit Summary.  MyChart is used to connect with patients for Virtual Visits (Telemedicine).  Patients are able to view lab/test results, encounter notes, upcoming appointments, etc.  Non-urgent messages can be sent to your provider as well.   To learn more about what you can do with MyChart, go to [email or web address].    Your next appointment:   1 year(s)  The format for your next appointment:   In Person  Provider:   Candee Furbish, MD  Thank you for choosing [facility]!!     I,Patrick [name],acting as a  scribe for  Candee Furbish, MD.,have documented all relevant documentation on the behalf of Candee Furbish, MD,as directed by  Candee Furbish, MD while in the presence of Candee Furbish, MD.  I, Candee Furbish, MD, have reviewed all documentation for this visit. The documentation on 12/08/20 for the exam, diagnosis, procedures, and orders are all accurate and complete.   Signed, Candee Furbish, MD  12/08/2020 4:33 PM    [place]

## 2020-12-08 NOTE — Assessment & Plan Note (Signed)
As noted above, changing Crestor from 10-20 given his known coronary artery disease/calcification.  High intensity dose.

## 2020-12-08 NOTE — Assessment & Plan Note (Signed)
Continue with diet, exercise, telmisartan 80 mg.  Doing very well.  He has had 20 to 30 pound weight loss.  Keto like diet.

## 2020-12-22 ENCOUNTER — Telehealth: Payer: Self-pay | Admitting: Internal Medicine

## 2020-12-22 NOTE — Telephone Encounter (Signed)
Type of form received: Medical Clearance for Scuba Diving   Form placed in: Provider mailbox  Additional instructions from the patient: notify by phone when complete   Things to remember: [place] office: If form received in person, remind patient that forms take 7-10 business days CMA should attach charge sheet and put on Supervisor's desk

## 2020-12-29 ENCOUNTER — Encounter: Payer: Self-pay | Admitting: Internal Medicine

## 2020-12-30 NOTE — Telephone Encounter (Signed)
Pt. Has called to follow up abou form that was dropped off. Reminded pt. That forms can take 7-10 business days to be completed.

## 2020-12-30 NOTE — Telephone Encounter (Signed)
Patient notified to pick up paperwork at the front desk

## 2021-02-22 ENCOUNTER — Encounter: Payer: Self-pay | Admitting: Allergy

## 2021-02-22 NOTE — Progress Notes (Signed)
Reviewed notes from Dr. Benjamine Mola. Date of service: 02/21/2021. See scanned notes for full documentation. Improved symptoms with Flonase and valsalva exercise.

## 2021-05-12 NOTE — Progress Notes (Signed)
?Charlann Boxer D.O. ?[facility] ?[place] ?Phone: [phone] ?Subjective:   ?I, Patrick [name], am serving as a Education administrator for Dr. Hulan Saas. ?This visit occurred during the SARS-CoV-2 public health emergency.  Safety protocols were in place, including screening questions prior to the visit, additional usage of staff PPE, and extensive cleaning of exam room while observing appropriate contact time as indicated for disinfecting solutions.  ? ?I'm seeing this patient by the request  of:  Biagio Borg, MD ? ?CC: right knee pain  ? ?[id number]  ?Last seen 2021 for left knee pain ? ?Patrick [name] is a 61 y.o. male coming in with complaint of right knee pain.. Knot on the top of his knee that has grown and shrunk. When kneeling on it feels like kneeling on pins. Towards the end of last year when he noticed the knot. Only hurts with pressure. ? ? ?  ? ?Past Medical History:  ?Diagnosis Date  ? Allergy   ? Arthritis   ? left knee  ? Dysrhythmia   ? " [name]"  ? Heart murmur   ? History of hiatal hernia   ? 4 years ago per patient- no surgery needed  ? History of kidney stones   ? Hypertension   ? ?Past Surgical History:  ?Procedure Laterality Date  ? APPENDECTOMY    ? Arthroscopic knee surgeries on both knees    ? CHOLECYSTECTOMY    ? NASAL SEPTOPLASTY W/ TURBINOPLASTY Bilateral 04/13/2019  ? Procedure: NASAL SEPTOPLASTY WITH BILATERAL TURBINATE REDUCTION;  Surgeon: Leta Baptist, MD;  Location: [facility];  Service: ENT;  Laterality: Bilateral;  ? TOTAL KNEE ARTHROPLASTY Left 01/21/2020  ? Procedure: LEFT TOTAL KNEE ARTHROPLASTY;  Surgeon: Meredith Pel, MD;  Location: [facility];  Service: Orthopedics;  Laterality: Left;  ? VASECTOMY    ? ?Social History  ? ?Socioeconomic History  ? Marital status: Married  ?  Spouse name: Not on file  ? Number of children: Not on file  ? Years of education: Not on file  ? Highest education level:  Not on file  ?Occupational History  ? Occupation: Print production planner, Financial controller, Semi-retired  ?Tobacco Use  ? Smoking status: Former  ?  Packs/day: 0.00  ?  Types: Cigars, Cigarettes  ? Smokeless tobacco: Never  ? Tobacco comments:  ?  one cigar every few years  ?Vaping Use  ? Vaping Use: Never used  ?Substance and Sexual Activity  ? Alcohol use: Yes  ?  Alcohol/week: 5.0 standard drinks  ?  Types: 5 Standard drinks or equivalent per week  ? Drug use: No  ? Sexual activity: Yes  ?Other Topics Concern  ? Not on file  ?Social History Narrative  ? Married.  ? Education: College  ? Exercise: Yes  ? ?Social Determinants of Health  ? ?Financial Resource Strain: Not on file  ?Food Insecurity: Not on file  ?Transportation Needs: Not on file  ?Physical Activity: Not on file  ?Stress: Not on file  ?Social Connections: Not on file  ? ?Allergies  ?Allergen Reactions  ? Penicillins Hives  ?  Rash around neck  ?Has patient had a PCN reaction causing immediate rash, facial/tongue/throat swelling, SOB or lightheadedness with hypotension: Yes ?Has patient had a PCN reaction causing severe rash involving mucus membranes or skin necrosis: No ?Has patient had a PCN reaction that required hospitalization: No ?Has patient had a PCN reaction  occurring within the last 10 years: No ?If all of the above answers are "NO", then may proceed with Cephalosporin use.  ? Penicillin G Other (See Comments)  ? ?Family History  ?Adopted: Yes  ? ? ? ?Current Outpatient Medications (Cardiovascular):  ?  rosuvastatin (CRESTOR) 20 MG tablet, Take 1 tablet (20 mg total) by mouth daily. ?  telmisartan (MICARDIS) 80 MG tablet, TAKE ONE TABLET BY MOUTH DAILY ? ?Current Outpatient Medications (Respiratory):  ?  cetirizine (ZYRTEC) 10 MG tablet, Take 10 mg by mouth at bedtime.  ? ?Current Outpatient Medications (Analgesics):  ?  aspirin 81 MG EC tablet, Take 81 mg by mouth daily.  ? ? ?Current Outpatient Medications (Other):  ?  cholecalciferol (VITAMIN D3) 25 MCG (1000  UNIT) tablet, Take 1,000 Units by mouth daily.  ?  Ginger, Zingiber officinalis, (GINGER ROOT) 550 MG CAPS, Take 550 mg by mouth daily.  ?  Krill Oil 300 MG CAPS,  ?  Misc Natural Products (TART CHERRY ADVANCED) CAPS, Take 1 capsule by mouth daily.  ?  Multiple Vitamin (MULTIVITAMIN WITH MINERALS) TABS tablet, Take 1 tablet by mouth daily. Men's Over 66  ?  Turmeric Curcumin 500 MG CAPS, Take 500 mg by mouth daily.  ? ? ?Reviewed prior external information including notes and imaging from  ?primary care provider ?As well as notes that were available from care everywhere and other healthcare systems. ? ?Past medical history, social, surgical and family history all reviewed in electronic medical record.  No pertanent information unless stated regarding to the chief complaint.  ? ?Review of Systems: ? No headache, visual changes, nausea, vomiting, diarrhea, constipation, dizziness, abdominal pain, skin rash, fevers, chills, night sweats, weight loss, swollen lymph nodes, body aches, joint swelling, chest pain, shortness of breath, mood changes. POSITIVE muscle aches ? ?Objective  ?Blood pressure 118/86, pulse 90, height '5\' 10"'$  (1.778 m), weight 234 lb (106.1 kg), SpO2 96 %. ?  ?General: No apparent distress alert and oriented x3 mood and affect normal, dressed appropriately.  ?HEENT: Pupils equal, extraocular movements intact  ?Respiratory: Patient's speak in full sentences and does not appear short of breath  ?Cardiovascular: No lower extremity edema, non tender, no erythema  ?Gait normal with good balance and coordination.  ?MSK:  right knee does have hypertrophy noted of the inferior aspect of the patella.  Full range of motion though noted.  Very minimal tenderness to palpation in the area.  No crepitus noted.  No instability of the knee contralateral knee does have replacement. ? ?Limited muscular skeletal ultrasound was performed and interpreted by Hulan Saas, M  ?Limited ultrasound does not show any  significant tearing of the patella tendon but patient does have calcific changes noted at the origin of the patella.  Nothing that seems to be acute.  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 ?  ?Impression and Recommendations:  ?  ? ?The above documentation has been reviewed and is accurate and complete Lyndal Pulley, DO ? ? ? ?

## 2021-05-15 ENCOUNTER — Other Ambulatory Visit: Payer: Self-pay

## 2021-05-15 ENCOUNTER — Ambulatory Visit: Payer: Self-pay

## 2021-05-15 ENCOUNTER — Encounter: Payer: Self-pay | Admitting: Family Medicine

## 2021-05-15 ENCOUNTER — Ambulatory Visit (INDEPENDENT_AMBULATORY_CARE_PROVIDER_SITE_OTHER): Payer: 59 | Admitting: Family Medicine

## 2021-05-15 ENCOUNTER — Ambulatory Visit (INDEPENDENT_AMBULATORY_CARE_PROVIDER_SITE_OTHER): Payer: 59

## 2021-05-15 VITALS — BP 118/86 | HR 90 | Ht 70.0 in | Wt 234.0 lb

## 2021-05-15 DIAGNOSIS — M25561 Pain in right knee: Secondary | ICD-10-CM

## 2021-05-15 DIAGNOSIS — M65869 Other synovitis and tenosynovitis, unspecified lower leg: Secondary | ICD-10-CM | POA: Insufficient documentation

## 2021-05-15 NOTE — Patient Instructions (Addendum)
Xray today ?Patella strap to wear with a lot of activity ?Do prescribed exercises at least 3x a week ? ?See you again in 6 weeks if giving more trouble ?

## 2021-05-15 NOTE — Assessment & Plan Note (Signed)
Patient on ultrasound does have some calcific changes noted.  Discussed with patient at great length.  I do not see anything that seems to be acute and everything seems to be more chronic in relation.  Discussed avoiding certain activities like putting pressure on it specifically.  Discussed the possibility of a patella strap that could be beneficial as well with certain heavy lifting.  No erythema or inflammation noted at this moment.  Patient can increase activity slowly and follow-up with me again in 6 to 8 weeks. ?

## 2021-05-16 ENCOUNTER — Encounter: Payer: Self-pay | Admitting: Family Medicine

## 2021-06-26 ENCOUNTER — Ambulatory Visit: Payer: 59 | Admitting: Family Medicine

## 2021-08-21 ENCOUNTER — Other Ambulatory Visit (INDEPENDENT_AMBULATORY_CARE_PROVIDER_SITE_OTHER): Payer: 59

## 2021-08-21 DIAGNOSIS — E785 Hyperlipidemia, unspecified: Secondary | ICD-10-CM

## 2021-08-21 DIAGNOSIS — Z0001 Encounter for general adult medical examination with abnormal findings: Secondary | ICD-10-CM | POA: Diagnosis not present

## 2021-08-21 DIAGNOSIS — R739 Hyperglycemia, unspecified: Secondary | ICD-10-CM

## 2021-08-21 DIAGNOSIS — E559 Vitamin D deficiency, unspecified: Secondary | ICD-10-CM | POA: Diagnosis not present

## 2021-08-21 LAB — HEPATIC FUNCTION PANEL
ALT: 32 U/L (ref 0–53)
AST: 23 U/L (ref 0–37)
Albumin: 4.2 g/dL (ref 3.5–5.2)
Alkaline Phosphatase: 50 U/L (ref 39–117)
Bilirubin, Direct: 0.2 mg/dL (ref 0.0–0.3)
Total Bilirubin: 0.7 mg/dL (ref 0.2–1.2)
Total Protein: 6.5 g/dL (ref 6.0–8.3)

## 2021-08-21 LAB — CBC WITH DIFFERENTIAL/PLATELET
Basophils Absolute: 0 10*3/uL (ref 0.0–0.1)
Basophils Relative: 0.8 % (ref 0.0–3.0)
Eosinophils Absolute: 0.2 10*3/uL (ref 0.0–0.7)
Eosinophils Relative: 3.9 % (ref 0.0–5.0)
HCT: 44.9 % (ref 39.0–52.0)
Hemoglobin: 15.4 g/dL (ref 13.0–17.0)
Lymphocytes Relative: 29.8 % (ref 12.0–46.0)
Lymphs Abs: 1.7 10*3/uL (ref 0.7–4.0)
MCHC: 34.2 g/dL (ref 30.0–36.0)
MCV: 88.8 fl (ref 78.0–100.0)
Monocytes Absolute: 0.7 10*3/uL (ref 0.1–1.0)
Monocytes Relative: 11.8 % (ref 3.0–12.0)
Neutro Abs: 3.1 10*3/uL (ref 1.4–7.7)
Neutrophils Relative %: 53.7 % (ref 43.0–77.0)
Platelets: 199 10*3/uL (ref 150.0–400.0)
RBC: 5.06 Mil/uL (ref 4.22–5.81)
RDW: 13.1 % (ref 11.5–15.5)
WBC: 5.8 10*3/uL (ref 4.0–10.5)

## 2021-08-21 LAB — URINALYSIS, ROUTINE W REFLEX MICROSCOPIC
Bilirubin Urine: NEGATIVE
Hgb urine dipstick: NEGATIVE
Ketones, ur: NEGATIVE
Leukocytes,Ua: NEGATIVE
Nitrite: NEGATIVE
RBC / HPF: NONE SEEN (ref 0–?)
Specific Gravity, Urine: 1.025 (ref 1.000–1.030)
Total Protein, Urine: NEGATIVE
Urine Glucose: NEGATIVE
Urobilinogen, UA: 0.2 (ref 0.0–1.0)
WBC, UA: NONE SEEN (ref 0–?)
pH: 6 (ref 5.0–8.0)

## 2021-08-21 LAB — BASIC METABOLIC PANEL
BUN: 18 mg/dL (ref 6–23)
CO2: 30 mEq/L (ref 19–32)
Calcium: 9.1 mg/dL (ref 8.4–10.5)
Chloride: 107 mEq/L (ref 96–112)
Creatinine, Ser: 0.9 mg/dL (ref 0.40–1.50)
GFR: 92.87 mL/min (ref >60.00)
Glucose, Bld: 95 mg/dL (ref 70–99)
Potassium: 4.6 mEq/L (ref 3.5–5.1)
Sodium: 142 mEq/L (ref 135–145)

## 2021-08-21 LAB — LIPID PANEL
Cholesterol: 121 mg/dL (ref 0–200)
HDL: 53.5 mg/dL (ref >39.00)
LDL Cholesterol: 45 mg/dL (ref 0–99)
NonHDL: 67.34
Total CHOL/HDL Ratio: 2
Triglycerides: 111 mg/dL (ref 0.0–149.0)
VLDL: 22.2 mg/dL (ref 0.0–40.0)

## 2021-08-21 LAB — TSH: TSH: 1.68 u[IU]/mL (ref 0.35–5.50)

## 2021-08-21 LAB — VITAMIN D 25 HYDROXY (VIT D DEFICIENCY, FRACTURES): VITD: 51.18 ng/mL (ref 30.00–100.00)

## 2021-08-21 LAB — PSA: PSA: 0.49 ng/mL (ref 0.10–4.00)

## 2021-08-21 LAB — HEMOGLOBIN A1C: Hgb A1c MFr Bld: 5.7 % (ref 4.6–6.5)

## 2021-08-24 ENCOUNTER — Ambulatory Visit (INDEPENDENT_AMBULATORY_CARE_PROVIDER_SITE_OTHER): Payer: 59 | Admitting: Internal Medicine

## 2021-08-24 ENCOUNTER — Encounter: Payer: Self-pay | Admitting: Internal Medicine

## 2021-08-24 VITALS — BP 122/70 | HR 70 | Temp 98.2°F | Ht 70.0 in | Wt 233.0 lb

## 2021-08-24 DIAGNOSIS — E538 Deficiency of other specified B group vitamins: Secondary | ICD-10-CM | POA: Diagnosis not present

## 2021-08-24 DIAGNOSIS — E559 Vitamin D deficiency, unspecified: Secondary | ICD-10-CM

## 2021-08-24 DIAGNOSIS — R739 Hyperglycemia, unspecified: Secondary | ICD-10-CM

## 2021-08-24 DIAGNOSIS — Z0001 Encounter for general adult medical examination with abnormal findings: Secondary | ICD-10-CM | POA: Diagnosis not present

## 2021-08-24 DIAGNOSIS — J3089 Other allergic rhinitis: Secondary | ICD-10-CM

## 2021-08-24 DIAGNOSIS — I10 Essential (primary) hypertension: Secondary | ICD-10-CM

## 2021-08-24 DIAGNOSIS — Z125 Encounter for screening for malignant neoplasm of prostate: Secondary | ICD-10-CM | POA: Diagnosis not present

## 2021-08-24 DIAGNOSIS — E7800 Pure hypercholesterolemia, unspecified: Secondary | ICD-10-CM

## 2021-08-24 MED ORDER — TELMISARTAN 80 MG PO TABS: 80.0000 mg | ORAL_TABLET | Freq: Every day | ORAL | 3 refills | Status: DC

## 2021-08-24 MED ORDER — ROSUVASTATIN CALCIUM 20 MG PO TABS
20.0000 mg | ORAL_TABLET | Freq: Every day | ORAL | 3 refills | Status: DC
Start: 2021-08-24 — End: 2022-08-30

## 2021-08-24 NOTE — Patient Instructions (Signed)
Please continue all other medications as before, and refills have been done if requested.  Please have the pharmacy call with any other refills you may need.  Please continue your efforts at being more active, low cholesterol diet, and weight control.  You are otherwise up to date with prevention measures today.  Please keep your appointments with your specialists as you may have planned  Please make an Appointment to return for your 1 year visit, or sooner if needed, with Lab testing by Appointment as well, to be done about 3-5 days before at the [facility] (so this is for TWO appointments - please see the scheduling desk as you leave)   Due to the ongoing Covid 19 pandemic, our lab now requires an appointment for any labs done at our office.  If you need labs done and do not have an appointment, please call our office ahead of time to schedule before presenting to the lab for your testing.

## 2021-08-24 NOTE — Assessment & Plan Note (Signed)
Last vitamin D Lab Results  Component Value Date   VD25OH 51.18 08/21/2021   Stable, cont oral replacement

## 2021-08-24 NOTE — Progress Notes (Unsigned)
Patient ID: Patrick [name], male   DOB: 1960/05/31, 61 y.o.   MRN: [id number]         Chief Complaint:: wellness exam and Annual Exam  ***       HPI:  Patrick [name] is a 61 y.o. male here for wellness exam                        Also***   Wt Readings from Last 3 Encounters:  08/24/21 233 lb (105.7 kg)  05/15/21 234 lb (106.1 kg)  12/08/20 228 lb (103.4 kg)   BP Readings from Last 3 Encounters:  08/24/21 122/70  05/15/21 118/86  12/08/20 130/80   Immunization History  Administered Date(s) Administered   Moderna Sars-Covid-2 Vaccination 05/20/2019, 06/17/2019, 01/25/2020   Pneumococcal Conjugate-13 08/18/2020   Tdap 02/21/2015   Zoster Recombinat (Shingrix) 08/29/2020, 11/03/2020  There are no preventive care reminders to display for this patient.    Past Medical History:  Diagnosis Date   Allergy    Arthritis    left knee   Dysrhythmia    " [name]"   Heart murmur    History of hiatal hernia    4 years ago per patient- no surgery needed   History of kidney stones    Hypertension    Past Surgical History:  Procedure Laterality Date   APPENDECTOMY     Arthroscopic knee surgeries on both knees     CHOLECYSTECTOMY     NASAL SEPTOPLASTY W/ TURBINOPLASTY Bilateral 04/13/2019   Procedure: NASAL SEPTOPLASTY WITH BILATERAL TURBINATE REDUCTION;  Surgeon: Leta Baptist, MD;  Location: [facility];  Service: ENT;  Laterality: Bilateral;   TOTAL KNEE ARTHROPLASTY Left 01/21/2020   Procedure: LEFT TOTAL KNEE ARTHROPLASTY;  Surgeon: Meredith Pel, MD;  Location: [facility];  Service: Orthopedics;  Laterality: Left;   VASECTOMY      reports that he has quit smoking. His smoking use included cigars and cigarettes. He has never used smokeless tobacco. He reports current alcohol use of about 5.0 standard drinks of alcohol per week. He reports that he does not use drugs. family history is not on file. He was adopted. Allergies  Allergen  Reactions   Penicillins Hives    Rash around neck  Has patient had a PCN reaction causing immediate rash, facial/tongue/throat swelling, SOB or lightheadedness with hypotension: Yes Has patient had a PCN reaction causing severe rash involving mucus membranes or skin necrosis: No Has patient had a PCN reaction that required hospitalization: No Has patient had a PCN reaction occurring within the last 10 years: No If all of the above answers are "NO", then may proceed with Cephalosporin use.   Penicillin G Other (See Comments)   Current Outpatient Medications on File Prior to Visit  Medication Sig Dispense Refill   aspirin 81 MG EC tablet Take 81 mg by mouth daily.      cetirizine (ZYRTEC) 10 MG tablet Take 10 mg by mouth at bedtime.      cholecalciferol (VITAMIN D3) 25 MCG (1000 UNIT) tablet Take 1,000 Units by mouth daily.      Ginger, Zingiber officinalis, (GINGER ROOT) 550 MG CAPS Take 550 mg by mouth daily.      Krill Oil 300 MG CAPS      Misc Natural Products (TART CHERRY ADVANCED) CAPS Take 1 capsule by mouth daily.      Multiple Vitamin (MULTIVITAMIN WITH MINERALS) TABS tablet  Take 1 tablet by mouth daily. Men's Over 50      rosuvastatin (CRESTOR) 20 MG tablet Take 1 tablet (20 mg total) by mouth daily. 90 tablet 3   telmisartan (MICARDIS) 80 MG tablet TAKE ONE TABLET BY MOUTH DAILY 90 tablet 2   Turmeric Curcumin 500 MG CAPS Take 500 mg by mouth daily.      No current facility-administered medications on file prior to visit.        ROS:  All others reviewed and negative.  Objective        PE:  BP 122/70 (BP Location: Right Arm, Patient Position: Sitting, Cuff Size: Large)   Pulse 70   Temp 98.2 F (36.8 C) (Oral)   Ht '5\' 10"'$  (1.778 m)   Wt 233 lb (105.7 kg)   SpO2 96%   BMI 33.43 kg/m                 Constitutional: Pt appears in NAD               HENT: Head: NCAT.                Right Ear: External ear normal.                 Left Ear: External ear normal.                 Eyes: . Pupils are equal, round, and reactive to light. Conjunctivae and EOM are normal               Nose: without d/c or deformity               Neck: Neck supple. Gross normal ROM               Cardiovascular: Normal rate and regular rhythm.                 Pulmonary/Chest: Effort normal and breath sounds without rales or wheezing.                Abd:  Soft, NT, ND, + BS, no organomegaly               Neurological: Pt is alert. At baseline orientation, motor grossly intact               Skin: Skin is warm. No rashes, no other new lesions, LE edema - ***               Psychiatric: Pt behavior is normal without agitation   Micro: none  Cardiac tracings I have personally interpreted today:  none  Pertinent Radiological findings (summarize): none   Lab Results  Component Value Date   WBC 5.8 08/21/2021   HGB 15.4 08/21/2021   HCT 44.9 08/21/2021   PLT 199.0 08/21/2021   GLUCOSE 95 08/21/2021   CHOL 121 08/21/2021   TRIG 111.0 08/21/2021   HDL 53.50 08/21/2021   LDLCALC 45 08/21/2021   ALT 32 08/21/2021   AST 23 08/21/2021   NA 142 08/21/2021   K 4.6 08/21/2021   CL 107 08/21/2021   CREATININE 0.90 08/21/2021   BUN 18 08/21/2021   CO2 30 08/21/2021   TSH 1.68 08/21/2021   PSA 0.49 08/21/2021   HGBA1C 5.7 08/21/2021   Assessment/Plan:  Patrick [name] is a 61 y.o. White or Caucasian [1] male with  has a past medical history of Allergy, Arthritis, Dysrhythmia, Heart murmur, History of hiatal hernia, History of  kidney stones, and Hypertension.  No problem-specific Assessment & Plan notes found for this encounter.  Followup: No follow-ups on file.  Cathlean Cower, MD 08/24/2021 9:39 AM [facility]

## 2021-08-26 ENCOUNTER — Other Ambulatory Visit: Payer: Self-pay | Admitting: Internal Medicine

## 2021-08-27 IMAGING — DX DG KNEE COMPLETE 4+V*R*
4 series · 4 of 4 positions shown · non-contrast
Comparison: None.

CLINICAL DATA: Chronic right knee pain.

EXAM:
RIGHT KNEE - COMPLETE 4+ VIEW

[knee ap]
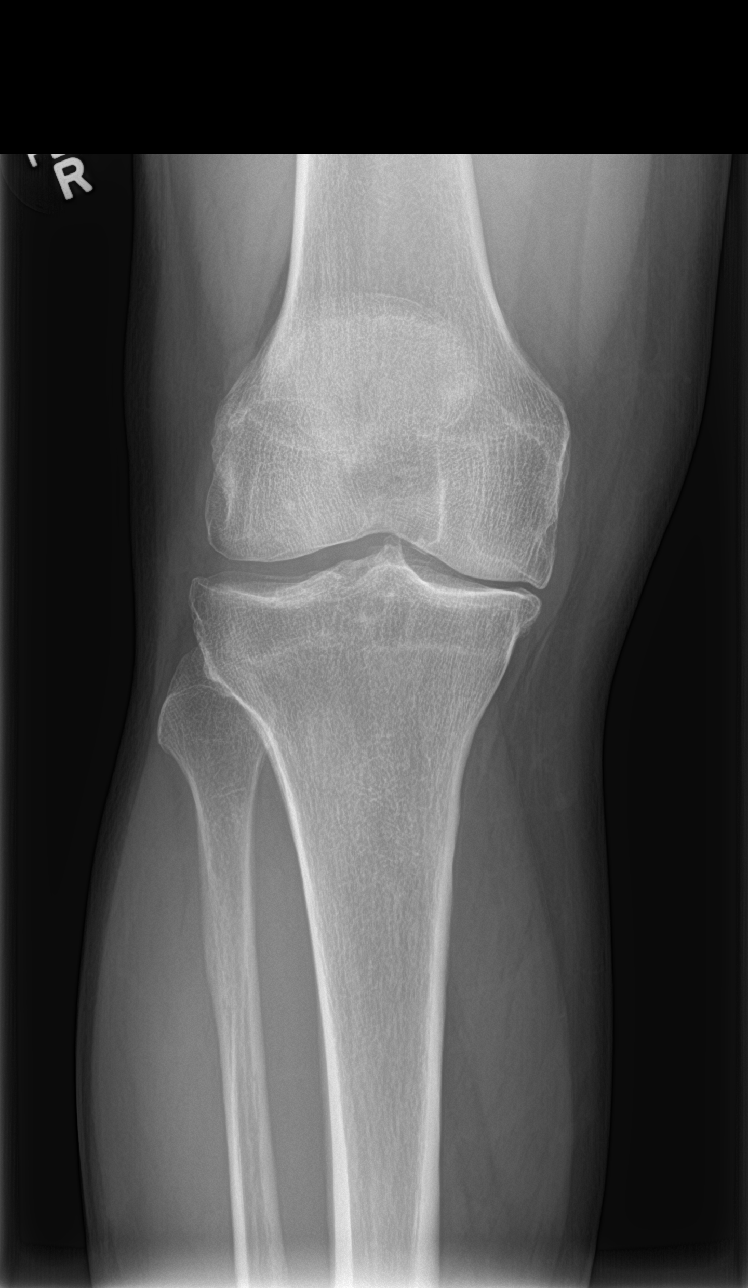

[knee tunnel]
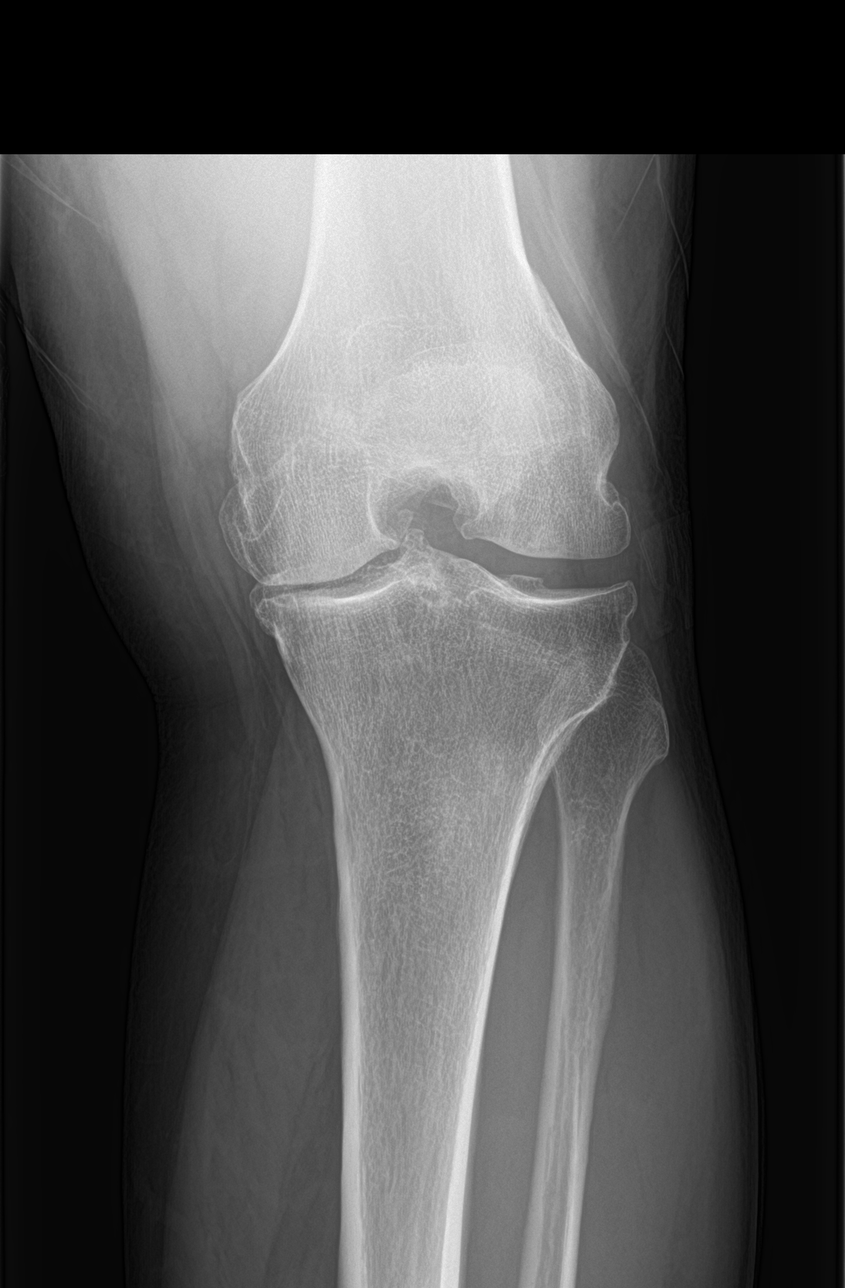

[knee lat]
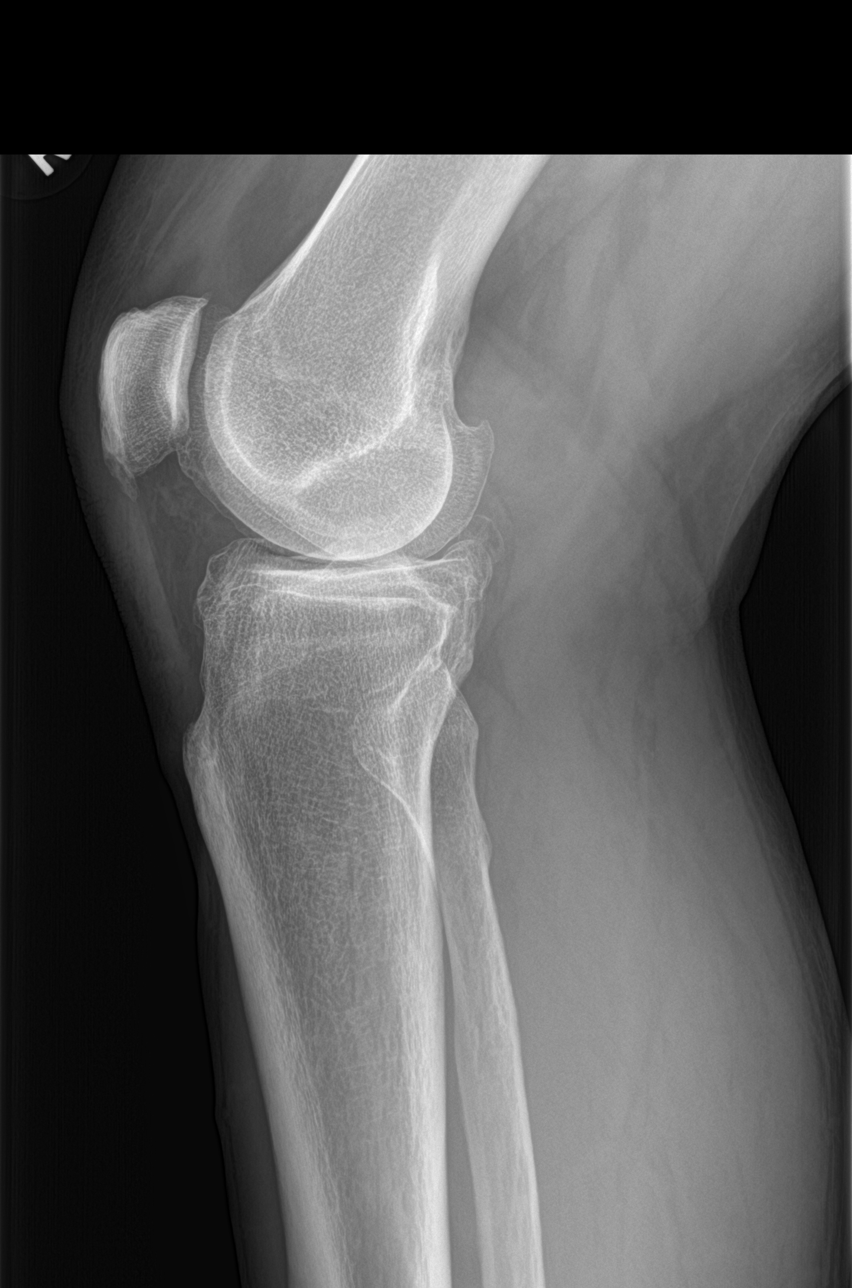

[sunrise]
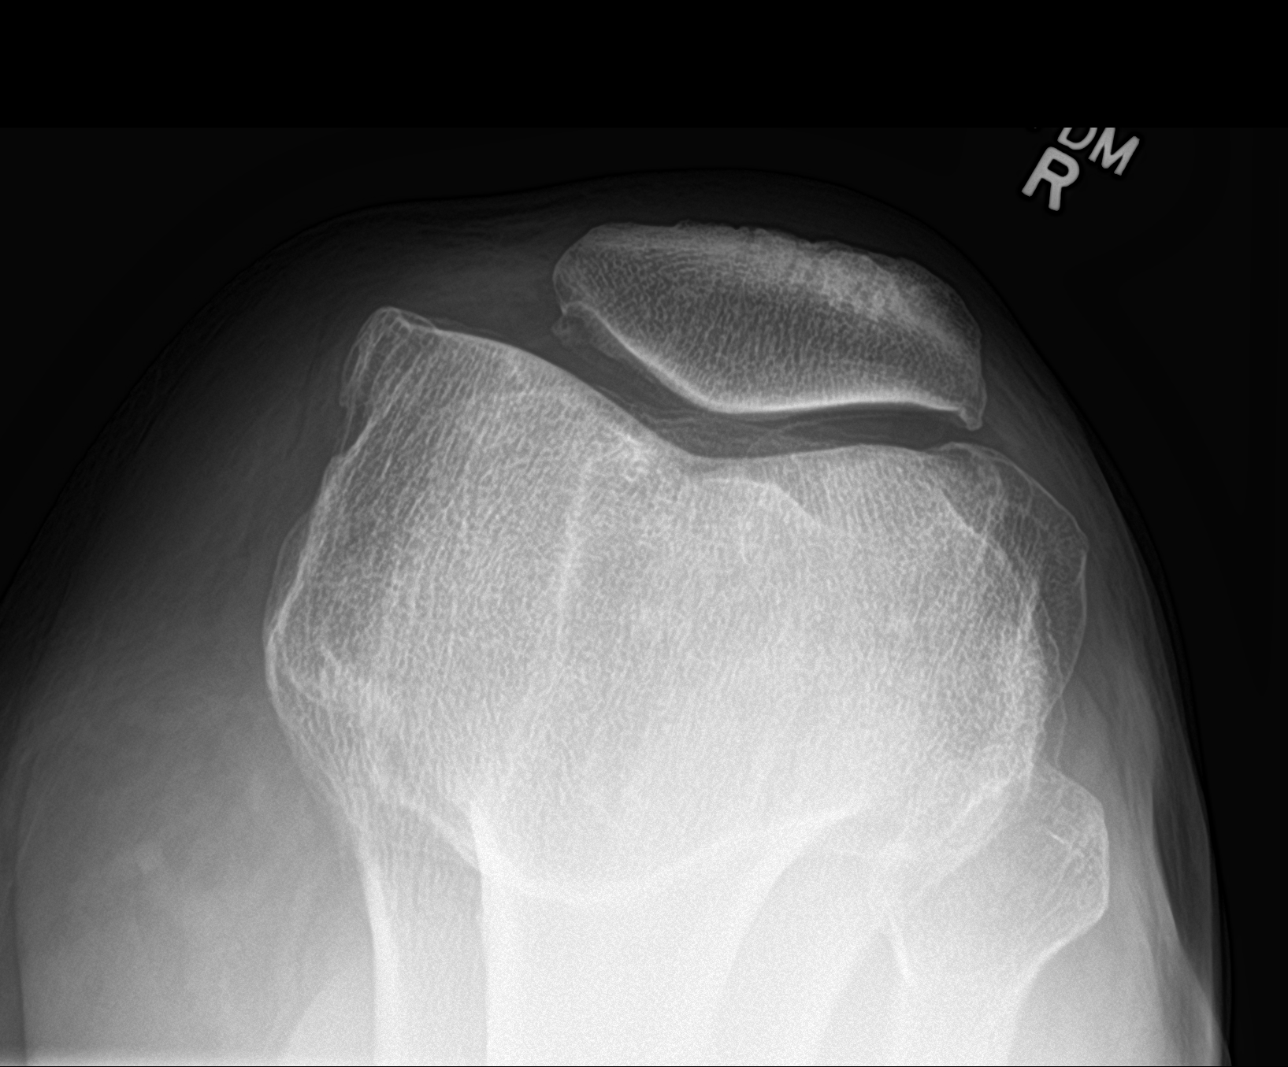

[4 of 4 positions shown; findings below may reference images not displayed]

FINDINGS: There is medial joint space narrowing with marginal osteophytes in
the patellofemoral and medial compartments. Minimal joint effusion.
No fracture or dislocation.
IMPRESSION: Osteoarthritis of the medial and patellofemoral compartments.
Minimal joint effusion.

## 2021-08-28 NOTE — Assessment & Plan Note (Signed)

## 2021-08-28 NOTE — Assessment & Plan Note (Signed)
Lab Results  Component Value Date   HGBA1C 5.7 08/21/2021   Stable, pt to continue current medical treatment  - diet, wt control, excercisse

## 2021-12-13 DIAGNOSIS — S90212A Contusion of left great toe with damage to nail, initial encounter: Secondary | ICD-10-CM | POA: Diagnosis not present

## 2021-12-13 DIAGNOSIS — M898X7 Other specified disorders of bone, ankle and foot: Secondary | ICD-10-CM | POA: Diagnosis not present

## 2021-12-31 DIAGNOSIS — J00 Acute nasopharyngitis [common cold]: Secondary | ICD-10-CM | POA: Diagnosis not present

## 2022-01-02 ENCOUNTER — Ambulatory Visit
Admission: RE | Admit: 2022-01-02 | Discharge: 2022-01-02 | Disposition: A | Discharge status: Home / Self Care | Payer: 59 | Source: Ambulatory Visit | Attending: Emergency Medicine | Admitting: Emergency Medicine

## 2022-01-02 VITALS — BP 128/83 | HR 116 | Temp 98.7°F | Resp 16

## 2022-01-02 DIAGNOSIS — J9801 Acute bronchospasm: Secondary | ICD-10-CM

## 2022-01-02 DIAGNOSIS — R058 Other specified cough: Secondary | ICD-10-CM | POA: Diagnosis not present

## 2022-01-02 DIAGNOSIS — J019 Acute sinusitis, unspecified: Secondary | ICD-10-CM | POA: Diagnosis not present

## 2022-01-02 DIAGNOSIS — B9689 Other specified bacterial agents as the cause of diseases classified elsewhere: Secondary | ICD-10-CM

## 2022-01-02 DIAGNOSIS — B309 Viral conjunctivitis, unspecified: Secondary | ICD-10-CM

## 2022-01-02 MED ORDER — FLUTICASONE PROPIONATE 50 MCG/ACT NA SUSP: 1.0000 | Freq: Every day | NASAL | 2 refills | Status: DC

## 2022-01-02 MED ORDER — GUAIFENESIN 400 MG PO TABS: ORAL_TABLET | ORAL | 0 refills | Status: DC

## 2022-01-02 MED ORDER — AEROCHAMBER PLUS FLO-VU LARGE MISC: 1.0000 | Freq: Once | 0 refills | Status: AC

## 2022-01-02 MED ORDER — PROMETHAZINE-DM 6.25-15 MG/5ML PO SYRP: 5.0000 mL | ORAL_SOLUTION | Freq: Four times a day (QID) | ORAL | 0 refills | Status: DC | PRN

## 2022-01-02 MED ORDER — OLOPATADINE HCL 0.2 % OP SOLN: 1.0000 [drp] | Freq: Every day | OPHTHALMIC | 1 refills | Status: DC

## 2022-01-02 MED ORDER — LEVOFLOXACIN 750 MG PO TABS: 750.0000 mg | ORAL_TABLET | Freq: Every day | ORAL | 0 refills | Status: AC

## 2022-01-02 MED ORDER — ALBUTEROL SULFATE HFA 108 (90 BASE) MCG/ACT IN AERS: 2.0000 | INHALATION_SPRAY | Freq: Four times a day (QID) | RESPIRATORY_TRACT | 0 refills | Status: DC | PRN

## 2022-01-02 MED ORDER — METHYLPREDNISOLONE 4 MG PO TBPK: ORAL_TABLET | ORAL | 0 refills | Status: DC

## 2022-01-02 NOTE — ED Triage Notes (Signed)
Pt states 12/23/21 he had a sore throat, that progressed to productive cough, congestion, bilateral eye redness and crusting.   Home interventions: netty pot, mucinex, nyquil, Atrovent nasal spray, tessalon pearls   At home covid test last wednesday was negative.

## 2022-01-02 NOTE — Discharge Instructions (Addendum)
Based on physical exam findings, I believe that you have developed acute bacterial sinusitis and bronchospasm with possible bronchitis although I cannot confirm that.  To clarify your history of antibiotic use, after reviewing your medication history, you have not received Keflex in the past, you have been prescribed clindamycin which sounds a similar, I do not think it is safe to prescribe cefdinir for you at this time.  Please read below to learn more about the medications, dosages and frequencies that I recommend to help alleviate your symptoms and to get you feeling better soon:  Levofloxacin: Take 1 capsule daily for 7 days.  This antibiotic can cause upset stomach, this will resolve once antibiotics are complete.  You are welcome to use a probiotic, eat yogurt, take Imodium while taking this medication.  Please avoid other systemic medications such as Maalox, Pepto-Bismol or milk of magnesia as they can interfere with your body's ability to absorb the antibiotics.      Medrol Dosepak (methylprednisolone): This is a steroid that will significantly calm your upper and lower airways, please take one row of tablets daily with your breakfast meal starting tomorrow morning until the prescription is complete.      Flonase (fluticasone): This is a steroid nasal spray that you use once daily, 1 spray in each nare.  This medication does not work well if you decide to use it only used as you feel you need to, it works best used on a daily basis.  After 3 to 5 days of use, you will notice significant reduction of the inflammation and mucus production that is currently being caused by exposure to allergens, whether seasonal or environmental.  The most common side effect of this medication is nosebleeds.  If you experience a nosebleed, please discontinue use for 1 week, then feel free to resume.       Atrovent (ipratropium): This is an excellent nasal decongestant spray that works better when used with a nasal  steroid, please instill 2 sprays into each nare with each use.  Because nasal steroids can take several days before they begin to provide full benefit, I recommend that you continue to use this spray in addition to the nasal steroid prescribed for you.  Please use it after you have used your nasal steroid and repeat up to 3 times daily as needed.     ProAir, Ventolin, Proventil (albuterol): This inhaled medication contains a short acting beta agonist bronchodilator.  This medication works on the smooth muscle that opens and constricts of your airways by relaxing the muscle.  The result of relaxation of the smooth muscle is increased air movement and improved work of breathing.  This is a short acting medication that can be used every 4-6 hours as needed for increased work of breathing, shortness of breath, wheezing and excessive coughing.    Advil, Motrin (ibuprofen): This is a good anti-inflammatory medication which not only addresses aches, pains but also significantly reduces soft tissue inflammation of the upper airways that causes sinus and nasal congestion as well as inflammation of the lower airways which makes you feel like your breathing is constricted or your cough feel tight.  I recommend that you take 400 mg every 8 hours as needed.      Pataday (olopatadine): This is an antihistamine eyedrop that can be used once daily to help relieve dry eyes, itchy eyes and red eyes.  This antihistamine drop not only works for allergic conjunctivitis but is also very helpful with viral  conjunctivitis.  Please do not use this drop more than once a day, for best relief please use this in the morning.      Robitussin, Mucinex (guaifenesin): This is an expectorant.  This helps break up chest congestion and loosen up thick nasal drainage making phlegm and drainage more liquid and therefore easier to remove.  I recommend being 400 mg three times daily as needed.      Promethazine DM: Promethazine is both a nasal  decongestant and an antinausea medication that makes most patients feel fairly sleepy.  The DM is dextromethorphan, a cough suppressant found in many over-the-counter cough medications.  Please take 5 mL before bedtime to minimize your cough which will help you sleep better.      Please follow-up within the next 5-7 days either with your primary care provider or urgent care if your symptoms do not resolve.  If you do not have a primary care provider, we will assist you in finding one.        Thank you for visiting urgent care today.  We appreciate the opportunity to participate in your care.

## 2022-01-02 NOTE — ED Provider Notes (Signed)
[facility]    CSN: [id number] Arrival date & time: 01/02/22  0917    HISTORY   Chief Complaint  Patient presents with   Nasal Congestion    Had a video appointment through my insurance company with a [facility] doctor about my issue.  Diagnosed as a cold.  Gave me a pill for cough and a nasal spray.  Coughing is worse and I'm getting a thick mucus with my coughs and from my nose. - Entered by patient   Conjunctivitis   Sore Throat   Cough   HPI Patrick [name] is a pleasant, 61 y.o. male who presents to urgent care today. Patient complains of sore throat, cough productive of thick sputum, nasal congestion, bilateral eye redness with crusting of his eyelashes when he wakes up in the morning.  Patient states this has been going on since 12/23/2021.  Patient states he has been using a Nettie pot and Atrovent nasal spray as prescribed by a [facility] video provider, has also been using [employer]  Patient states he has not had any relief of his symptoms and actually feels like he is getting worse.  Patient denies fever, aches, chills, nausea, vomiting, diarrhea, known sick contacts, loss of taste or smell.  The history is provided by the patient.   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   Recurrent left knee instability 09/01/2019   Baker's cyst of knee, left 08/12/2019   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   Acute bursitis of  right shoulder 02/02/2019   Coughing 11/06/2018   Penicillin allergy 11/06/2018   Hip flexor tightness 09/29/2018   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   Hyperglycemia 08/02/2018   HLD (hyperlipidemia) 08/02/2018   Vitamin D deficiency 08/02/2018   Encounter for well adult exam with abnormal findings 07/30/2018   HTN (hypertension) 07/30/2018   Tinnitus aurium, 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   Snoring 05/09/2017   Other allergic rhinitis 02/21/2015   Past Surgical History:  Procedure Laterality Date   APPENDECTOMY     Arthroscopic knee surgeries on both knees     CHOLECYSTECTOMY     NASAL SEPTOPLASTY W/ TURBINOPLASTY Bilateral 04/13/2019   Procedure: NASAL SEPTOPLASTY WITH BILATERAL TURBINATE REDUCTION;  Surgeon: Leta Baptist, MD;  Location: [facility];  Service: ENT;  Laterality: Bilateral;   TOTAL KNEE ARTHROPLASTY Left 01/21/2020   Procedure: LEFT TOTAL KNEE ARTHROPLASTY;  Surgeon: Meredith Pel, MD;  Location: [facility];  Service: Orthopedics;  Laterality: Left;   VASECTOMY      Home Medications    Prior to Admission medications   Medication Sig Start Date End Date Taking? Authorizing Provider  aspirin 81 MG EC tablet Take 81 mg by mouth daily.  03/05/98   [provider]  cetirizine (ZYRTEC) 10 MG tablet Take 10 mg by mouth at bedtime.  [provider]  cholecalciferol (VITAMIN D3) 25 MCG (1000 UNIT) tablet Take 1,000 Units by mouth daily.  11/02/18   [provider]  Ginger, Zingiber officinalis, (GINGER ROOT) 550 MG CAPS Take 550 mg by mouth daily.     [provider]  Astrid Drafts 300 MG CAPS     [provider]  Misc Natural Products (TART CHERRY ADVANCED) CAPS Take 1 capsule by mouth daily.     [provider]  Multiple Vitamin (MULTIVITAMIN WITH MINERALS) TABS tablet Take 1 tablet by mouth daily. Men's Over 50     [provider]  rosuvastatin (CRESTOR) 20 MG tablet Take 1 tablet (20 mg total) by mouth  daily. 08/24/21   Biagio Borg, MD  telmisartan (MICARDIS) 80 MG tablet TAKE ONE TABLET BY MOUTH DAILY 08/28/21   Biagio Borg, MD  Turmeric Curcumin 500 MG CAPS Take 500 mg by mouth daily.     [provider]    Family History Family History  Adopted: Yes   Social History Social History   Tobacco Use   Smoking status: Former    Packs/day: 0.00    Types: Cigars, Cigarettes   Smokeless tobacco: Never   Tobacco comments:    one cigar every few years  Vaping Use   Vaping Use: Never used  Substance Use Topics   Alcohol use: Yes    Alcohol/week: 5.0 standard drinks of alcohol    Types: 5 Standard drinks or equivalent per week   Drug use: No   Allergies   Penicillins and Penicillin g  Review of Systems Review of Systems Pertinent findings revealed after performing a 14 point review of systems has been noted in the history of present illness.  Physical Exam Triage Vital Signs ED Triage Vitals  Enc Vitals Group     BP 12/30/20 0827 (!) 147/82     Pulse Rate 12/30/20 0827 72     Resp 12/30/20 0827 18     Temp 12/30/20 0827 98.3 F (36.8 C)     Temp Source 12/30/20 0827 Oral     SpO2 12/30/20 0827 98 %     Weight --      Height --      Head Circumference --      Peak Flow --      Pain Score 12/30/20 0826 5     Pain Loc --      Pain Edu? --      Excl. in [place]? --   No data found.  Updated Vital Signs BP 128/83 (BP Location: Right Arm)   Pulse (!) 116   Temp 98.7 F (37.1 C) (Oral)   Resp 16   SpO2 94%   Physical Exam Vitals and nursing note reviewed.  Constitutional:      General: He is not in acute distress.    Appearance: Normal appearance. He is not ill-appearing.  HENT:     Head: Normocephalic and atraumatic.     Salivary Glands: Right salivary gland is not diffusely enlarged or tender. Left salivary gland is not diffusely enlarged or tender.     Right Ear: Ear canal and external ear normal. No drainage. A middle ear effusion is present. There is  no impacted cerumen. Tympanic membrane is bulging. Tympanic membrane is not injected or erythematous.     Left Ear: Ear canal and external ear normal. No drainage. A middle ear effusion is present. There is no impacted cerumen. Tympanic membrane is bulging. Tympanic membrane  is not injected or erythematous.     Ears:     Comments: Bilateral EACs normal, both TMs bulging with clear fluid    Nose: Rhinorrhea present. No nasal deformity, septal deviation, signs of injury, nasal tenderness, mucosal edema or congestion. Rhinorrhea is clear.     Right Nostril: Occlusion present. No foreign body, epistaxis or septal hematoma.     Left Nostril: Occlusion present. No foreign body, epistaxis or septal hematoma.     Right Turbinates: Enlarged, swollen and pale.     Left Turbinates: Enlarged, swollen and pale.     Right Sinus: No maxillary sinus tenderness or frontal sinus tenderness.     Left Sinus: No maxillary sinus tenderness or frontal sinus tenderness.     Mouth/Throat:     Lips: Pink. No lesions.     Mouth: Mucous membranes are moist. No oral lesions.     Pharynx: Oropharynx is clear. Uvula midline. No posterior oropharyngeal erythema or uvula swelling.     Tonsils: No tonsillar exudate. 0 on the right. 0 on the left.     Comments: Postnasal drip Eyes:     General: Lids are normal.        Right eye: No discharge.        Left eye: No discharge.     Extraocular Movements: Extraocular movements intact.     Conjunctiva/sclera: Conjunctivae normal.     Right eye: Right conjunctiva is not injected.     Left eye: Left conjunctiva is not injected.  Neck:     Trachea: Trachea and phonation normal.  Cardiovascular:     Rate and Rhythm: Normal rate and regular rhythm.     Pulses: Normal pulses.     Heart sounds: Normal heart sounds. No murmur heard.    No friction rub. No gallop.  Pulmonary:     Effort: Pulmonary effort is normal. No tachypnea, bradypnea, accessory muscle usage, prolonged  expiration, respiratory distress or retractions.     Breath sounds: Normal air entry. No stridor, decreased air movement or transmitted upper airway sounds. Examination of the right-middle field reveals wheezing. Examination of the left-lower field reveals wheezing. Wheezing present. No decreased breath sounds, rhonchi or rales.  Chest:     Chest wall: No tenderness.  Musculoskeletal:        General: Normal range of motion.     Cervical back: Normal range of motion and neck supple. Normal range of motion.  Lymphadenopathy:     Cervical: No cervical adenopathy.  Skin:    General: Skin is warm and dry.     Findings: No erythema or rash.  Neurological:     General: No focal deficit present.     Mental Status: He is alert and oriented to person, place, and time.  Psychiatric:        Mood and Affect: Mood normal.        Behavior: Behavior normal.     Visual Acuity Right Eye Distance:   Left Eye Distance:   Bilateral Distance:    Right Eye Near:   Left Eye Near:    Bilateral Near:     UC Couse / Diagnostics / Procedures:     Radiology No results found.  Procedures Procedures (including critical care time) EKG  Pending results:  Labs Reviewed - No data to display  Medications Ordered in UC: Medications - No data to display  UC Diagnoses / Final Clinical Impressions(s)   I have reviewed the triage vital signs and the nursing  notes.  Pertinent labs & imaging results that were available during my care of the patient were reviewed by me and considered in my medical decision making (see chart for details).    Final diagnoses:  Acute bronchospasm  Acute bacterial sinusitis  Cough productive of purulent sputum   Viral testing deferred due to duration of symptoms.  Patient had good air movement throughout all lung fields, I do not believe x-ray would yield any useful information to assist with plan of care.  Please read patient discharge instructions, included below, for  details of patient plan of care.  Return precautions advised.  ED Prescriptions   None    PDMP not reviewed this encounter.  Disposition Upon Discharge:  Condition: stable for discharge home Home: take medications as prescribed; routine discharge instructions as discussed; follow up as advised.  Patient presented with an acute illness with associated systemic symptoms and significant discomfort requiring urgent management. In my opinion, this is a condition that a prudent lay person (someone who possesses an average knowledge of health and medicine) may potentially expect to result in complications if not addressed urgently such as respiratory distress, impairment of bodily function or dysfunction of bodily organs.   Routine symptom specific, illness specific and/or disease specific instructions were discussed with the patient and/or caregiver at length.   As such, the patient has been evaluated and assessed, work-up was performed and treatment was provided in alignment with urgent care protocols and evidence based medicine.  Patient/parent/caregiver has been advised that the patient may require follow up for further testing and treatment if the symptoms continue in spite of treatment, as clinically indicated and appropriate.  If the patient was tested for COVID-19, Influenza and/or RSV, then the patient/parent/guardian was advised to isolate at home pending the results of his/her diagnostic coronavirus test and potentially longer if they're positive. I have also advised pt that if his/her COVID-19 test returns positive, it's recommended to self-isolate for at least 10 days after symptoms first appeared AND until fever-free for 24 hours without fever reducer AND other symptoms have improved or resolved. Discussed self-isolation recommendations as well as instructions for household member/close contacts as per the [facility] and [place] DHHS, and also gave patient the [place] packet with this  information.  Patient/parent/caregiver has been advised to return to the [facility] or PCP in 3-5 days if no better; to PCP or the Emergency Department if new signs and symptoms develop, or if the current signs or symptoms continue to change or worsen for further workup, evaluation and treatment as clinically indicated and appropriate  The patient will follow up with their current PCP if and as advised. If the patient does not currently have a PCP we will assist them in obtaining one.   The patient may need specialty follow up if the symptoms continue, in spite of conservative treatment and management, for further workup, evaluation, consultation and treatment as clinically indicated and appropriate.  Patient/parent/caregiver verbalized understanding and agreement of plan as discussed.  All questions were addressed during visit.  Please see discharge instructions below for further details of plan.  Discharge Instructions:   Discharge Instructions      Based on physical exam findings, I believe that you have developed acute bacterial sinusitis and bronchospasm with possible bronchitis although I cannot confirm that.  To clarify your history of antibiotic use, after reviewing your medication history, you have not received Keflex in the past, you have been prescribed clindamycin which sounds a similar, I do not  think it is safe to prescribe cefdinir for you at this time.  Please read below to learn more about the medications, dosages and frequencies that I recommend to help alleviate your symptoms and to get you feeling better soon:  Levofloxacin: Take 1 capsule daily for 7 days.  This antibiotic can cause upset stomach, this will resolve once antibiotics are complete.  You are welcome to use a probiotic, eat yogurt, take Imodium while taking this medication.  Please avoid other systemic medications such as Maalox, Pepto-Bismol or milk of magnesia as they can interfere with your body's ability to absorb  the antibiotics.      Medrol Dosepak (methylprednisolone): This is a steroid that will significantly calm your upper and lower airways, please take one row of tablets daily with your breakfast meal starting tomorrow morning until the prescription is complete.      Flonase (fluticasone): This is a steroid nasal spray that you use once daily, 1 spray in each nare.  This medication does not work well if you decide to use it only used as you feel you need to, it works best used on a daily basis.  After 3 to 5 days of use, you will notice significant reduction of the inflammation and mucus production that is currently being caused by exposure to allergens, whether seasonal or environmental.  The most common side effect of this medication is nosebleeds.  If you experience a nosebleed, please discontinue use for 1 week, then feel free to resume.       Atrovent (ipratropium): This is an excellent nasal decongestant spray that works better when used with a nasal steroid, please instill 2 sprays into each nare with each use.  Because nasal steroids can take several days before they begin to provide full benefit, I recommend that you continue to use this spray in addition to the nasal steroid prescribed for you.  Please use it after you have used your nasal steroid and repeat up to 3 times daily as needed.     ProAir, Ventolin, Proventil (albuterol): This inhaled medication contains a short acting beta agonist bronchodilator.  This medication works on the smooth muscle that opens and constricts of your airways by relaxing the muscle.  The result of relaxation of the smooth muscle is increased air movement and improved work of breathing.  This is a short acting medication that can be used every 4-6 hours as needed for increased work of breathing, shortness of breath, wheezing and excessive coughing.    Advil, Motrin (ibuprofen): This is a good anti-inflammatory medication which not only addresses aches, pains but also  significantly reduces soft tissue inflammation of the upper airways that causes sinus and nasal congestion as well as inflammation of the lower airways which makes you feel like your breathing is constricted or your cough feel tight.  I recommend that you take 400 mg every 8 hours as needed.      Pataday (olopatadine): This is an antihistamine eyedrop that can be used once daily to help relieve dry eyes, itchy eyes and red eyes.  This antihistamine drop not only works for allergic conjunctivitis but is also very helpful with viral conjunctivitis.  Please do not use this drop more than once a day, for best relief please use this in the morning.      Robitussin, Mucinex (guaifenesin): This is an expectorant.  This helps break up chest congestion and loosen up thick nasal drainage making phlegm and drainage more liquid and therefore easier  to remove.  I recommend being 400 mg three times daily as needed.      Promethazine DM: Promethazine is both a nasal decongestant and an antinausea medication that makes most patients feel fairly sleepy.  The DM is dextromethorphan, a cough suppressant found in many over-the-counter cough medications.  Please take 5 mL before bedtime to minimize your cough which will help you sleep better.      Please follow-up within the next 5-7 days either with your primary care provider or urgent care if your symptoms do not resolve.  If you do not have a primary care provider, we will assist you in finding one.        Thank you for visiting urgent care today.  We appreciate the opportunity to participate in your care.          This office note has been dictated using Museum/gallery curator.  Unfortunately, this method of dictation can sometimes lead to typographical or grammatical errors.  I apologize for your inconvenience in advance if this occurs.  Please do not hesitate to reach out to me if clarification is needed.      Lynden Oxford Scales,  PA-C 01/03/22 1654

## 2022-01-18 ENCOUNTER — Ambulatory Visit: Payer: 59 | Admitting: Internal Medicine

## 2022-01-18 ENCOUNTER — Encounter: Payer: Self-pay | Admitting: Internal Medicine

## 2022-01-18 ENCOUNTER — Ambulatory Visit (INDEPENDENT_AMBULATORY_CARE_PROVIDER_SITE_OTHER): Payer: 59

## 2022-01-18 VITALS — BP 128/80 | HR 80 | Temp 98.8°F | Ht 70.0 in | Wt 230.0 lb

## 2022-01-18 DIAGNOSIS — M546 Pain in thoracic spine: Secondary | ICD-10-CM | POA: Diagnosis not present

## 2022-01-18 DIAGNOSIS — M549 Dorsalgia, unspecified: Secondary | ICD-10-CM | POA: Insufficient documentation

## 2022-01-18 DIAGNOSIS — I10 Essential (primary) hypertension: Secondary | ICD-10-CM

## 2022-01-18 DIAGNOSIS — R051 Acute cough: Secondary | ICD-10-CM

## 2022-01-18 DIAGNOSIS — R059 Cough, unspecified: Secondary | ICD-10-CM | POA: Diagnosis not present

## 2022-01-18 DIAGNOSIS — J329 Chronic sinusitis, unspecified: Secondary | ICD-10-CM

## 2022-01-18 DIAGNOSIS — R509 Fever, unspecified: Secondary | ICD-10-CM | POA: Diagnosis not present

## 2022-01-18 MED ORDER — AZITHROMYCIN 250 MG PO TABS: ORAL_TABLET | ORAL | 1 refills | Status: AC

## 2022-01-18 NOTE — Patient Instructions (Addendum)
Your BP was rechecked  Please take all new medication as prescribed - the antibiotic  Please continue all other medications as before, and refills have been done if requested.  Please have the pharmacy call with any other refills you may need.  Please keep your appointments with your specialists as you may have planned  Please go to the XRAY Department in the first floor for the x-ray testing  You will be contacted by phone if any changes need to be made immediately.  Otherwise, you will receive a letter about your results with an explanation, but please check with MyChart first.  Please remember to sign up for MyChart if you have not done so, as this will be important to you in the future with finding out test results, communicating by private email, and scheduling acute appointments online when needed.  Please make an Appointment to return in August 30 2022, or sooner if needed, also with Lab Appointment for testing done 3-5 days before at the [facility] (so this is for TWO appointments - please see the scheduling desk as you leave)

## 2022-01-18 NOTE — Assessment & Plan Note (Signed)
?   Msk due to cough, but has cough and pleuritic - for cxr

## 2022-01-18 NOTE — Assessment & Plan Note (Addendum)
Mild to mod, for antibx course zpack,  to f/u any worsening symptoms or concerns 

## 2022-01-18 NOTE — Assessment & Plan Note (Signed)
BP Readings from Last 3 Encounters:  01/18/22 128/80  01/02/22 128/83  08/24/21 122/70   Stable, pt to continue medical treatment micardis 80 mg qd

## 2022-01-18 NOTE — Progress Notes (Signed)
Patient ID: Patrick [name], male   DOB: 1960-06-06, 61 y.o.   MRN: [id number]        Chief Complaint: follow up sinusitis, bilateral flank/back pain, htn       HPI:  Patrick [name] is a 61 y.o. male here to f/u after seen at [facility] [facility] with sinusitis, tx with levaquin x 7 days.  Was better, then getting worse again.   Here with 2-3 days acute onset fever, facial pain, pressure, headache, general weakness and malaise, and greenish d/c, with mild ST and cough, but pt denies chest pain, wheezing, increased sob or doe, orthopnea, PND, increased LE swelling, palpitations, dizziness or syncope.   Pt denies wt loss, night sweats, loss of appetite, or other constitutional symptoms       Wt Readings from Last 3 Encounters:  01/18/22 230 lb (104.3 kg)  08/24/21 233 lb (105.7 kg)  05/15/21 234 lb (106.1 kg)   BP Readings from Last 3 Encounters:  01/18/22 128/80  01/02/22 128/83  08/24/21 122/70         Past Medical History:  Diagnosis Date   Allergy    Arthritis    left knee   Dysrhythmia    " [name]"   Heart murmur    History of hiatal hernia    4 years ago per patient- no surgery needed   History of kidney stones    Hypertension    Past Surgical History:  Procedure Laterality Date   APPENDECTOMY     Arthroscopic knee surgeries on both knees     CHOLECYSTECTOMY     NASAL SEPTOPLASTY W/ TURBINOPLASTY Bilateral 04/13/2019   Procedure: NASAL SEPTOPLASTY WITH BILATERAL TURBINATE REDUCTION;  Surgeon: Leta Baptist, MD;  Location: [facility];  Service: ENT;  Laterality: Bilateral;   TOTAL KNEE ARTHROPLASTY Left 01/21/2020   Procedure: LEFT TOTAL KNEE ARTHROPLASTY;  Surgeon: Meredith Pel, MD;  Location: [facility];  Service: Orthopedics;  Laterality: Left;   VASECTOMY      reports that he has quit smoking. His smoking use included cigars and cigarettes. He has never used smokeless tobacco. He reports current alcohol use of about 5.0 standard drinks  of alcohol per week. He reports that he does not use drugs. family history is not on file. He was adopted. Allergies  Allergen Reactions   Penicillins Hives    Rash around neck  Has patient had a PCN reaction causing immediate rash, facial/tongue/throat swelling, SOB or lightheadedness with hypotension: Yes Has patient had a PCN reaction causing severe rash involving mucus membranes or skin necrosis: No Has patient had a PCN reaction that required hospitalization: No Has patient had a PCN reaction occurring within the last 10 years: No If all of the above answers are "NO", then may proceed with Cephalosporin use.   Penicillin G Other (See Comments)   Current Outpatient Medications on File Prior to Visit  Medication Sig Dispense Refill   albuterol (VENTOLIN HFA) 108 (90 Base) MCG/ACT inhaler Inhale 2 puffs into the lungs every 6 (six) hours as needed for wheezing or shortness of breath (Cough). 36 g 0   aspirin 81 MG EC tablet Take 81 mg by mouth daily.      cetirizine (ZYRTEC) 10 MG tablet Take 10 mg by mouth at bedtime.      cholecalciferol (VITAMIN D3) 25 MCG (1000 UNIT) tablet Take 1,000 Units by mouth daily.      fluticasone (FLONASE) 50  MCG/ACT nasal spray Place 1 spray into both nostrils daily. Begin by using 2 sprays in each nare daily for 3 to 5 days, then decrease to 1 spray in each nare daily. 15.8 mL 2   Ginger, Zingiber officinalis, (GINGER ROOT) 550 MG CAPS Take 550 mg by mouth daily.      ipratropium (ATROVENT) 0.06 % nasal spray 2 spray into both nostrils three times a day for  - USE PRN     Krill Oil 300 MG CAPS      Misc Natural Products (TART CHERRY ADVANCED) CAPS Take 1 capsule by mouth daily.      Multiple Vitamin (MULTIVITAMIN WITH MINERALS) TABS tablet Take 1 tablet by mouth daily. Men's Over 50      Olopatadine HCl (PATADAY) 0.2 % SOLN Apply 1 drop to eye daily. 2.5 mL 1   rosuvastatin (CRESTOR) 20 MG tablet Take 1 tablet (20 mg total) by mouth daily. 90 tablet 3    telmisartan (MICARDIS) 80 MG tablet TAKE ONE TABLET BY MOUTH DAILY 90 tablet 3   Turmeric Curcumin 500 MG CAPS Take 500 mg by mouth daily.      aspirin (ASPIRIN 81) 81 MG chewable tablet Aspirin 81     No current facility-administered medications on file prior to visit.        ROS:  All others reviewed and negative.  Objective        PE:  BP 128/80 (BP Location: Right Arm, Patient Position: Sitting, Cuff Size: Large)   Pulse 80   Temp 98.8 F (37.1 C) (Oral)   Ht '5\' 10"'$  (1.778 m)   Wt 230 lb (104.3 kg)   SpO2 94%   BMI 33.00 kg/m                 Constitutional: Pt appears in NAD               HENT: Head: NCAT.                Right Ear: External ear normal.                 Left Ear: External ear normal.  Bilat tm's with mild erythema.  Max sinus areas mild tender.  Pharynx with mild erythema, no exudate                 Eyes: . Pupils are equal, round, and reactive to light. Conjunctivae and EOM are normal               Nose: without d/c or deformity               Neck: Neck supple. Gross normal ROM               Cardiovascular: Normal rate and regular rhythm.                 Pulmonary/Chest: Effort normal and breath sounds without rales or wheezing.                               Neurological: Pt is alert. At baseline orientation, motor grossly intact               Skin: Skin is warm. No rashes, no other new lesions, LE edema - none               Psychiatric: Pt behavior is normal without agitation   Micro: none  Cardiac tracings I have personally interpreted today:  none  Pertinent Radiological findings (summarize): none   Lab Results  Component Value Date   WBC 5.8 08/21/2021   HGB 15.4 08/21/2021   HCT 44.9 08/21/2021   PLT 199.0 08/21/2021   GLUCOSE 95 08/21/2021   CHOL 121 08/21/2021   TRIG 111.0 08/21/2021   HDL 53.50 08/21/2021   LDLCALC 45 08/21/2021   ALT 32 08/21/2021   AST 23 08/21/2021   NA 142 08/21/2021   K 4.6 08/21/2021   CL 107 08/21/2021    CREATININE 0.90 08/21/2021   BUN 18 08/21/2021   CO2 30 08/21/2021   TSH 1.68 08/21/2021   PSA 0.49 08/21/2021   HGBA1C 5.7 08/21/2021   Assessment/Plan:  Patrick [name] is a 61 y.o. White or Caucasian [1] male with  has a past medical history of Allergy, Arthritis, Dysrhythmia, Heart murmur, History of hiatal hernia, History of kidney stones, and Hypertension.  Sinusitis Mild to mod, for antibx course zpack,  to f/u any worsening symptoms or concerns  Back pain ? Msk due to cough, but has cough and pleuritic - for cxr  HTN (hypertension) BP Readings from Last 3 Encounters:  01/18/22 128/80  01/02/22 128/83  08/24/21 122/70   Stable, pt to continue medical treatment micardis 80 mg qd  Followup: Return in about 32 weeks (around 08/30/2022).  Cathlean Cower, MD 01/18/2022 10:29 AM [facility]

## 2022-06-07 ENCOUNTER — Encounter: Payer: Self-pay | Admitting: Internal Medicine

## 2022-06-07 ENCOUNTER — Ambulatory Visit: Payer: 59 | Admitting: Internal Medicine

## 2022-06-07 VITALS — BP 128/80 | HR 117 | Temp 99.6°F | Ht 70.0 in | Wt 233.0 lb

## 2022-06-07 DIAGNOSIS — J4521 Mild intermittent asthma with (acute) exacerbation: Secondary | ICD-10-CM

## 2022-06-07 DIAGNOSIS — J329 Chronic sinusitis, unspecified: Secondary | ICD-10-CM

## 2022-06-07 DIAGNOSIS — J45909 Unspecified asthma, uncomplicated: Secondary | ICD-10-CM | POA: Insufficient documentation

## 2022-06-07 DIAGNOSIS — J3089 Other allergic rhinitis: Secondary | ICD-10-CM | POA: Diagnosis not present

## 2022-06-07 MED ORDER — PROMETHAZINE-DM 6.25-15 MG/5ML PO SYRP: 5.0000 mL | ORAL_SOLUTION | Freq: Four times a day (QID) | ORAL | 0 refills | Status: DC | PRN

## 2022-06-07 MED ORDER — CEFDINIR 300 MG PO CAPS: 300.0000 mg | ORAL_CAPSULE | Freq: Two times a day (BID) | ORAL | 0 refills | Status: DC

## 2022-06-07 MED ORDER — METHYLPREDNISOLONE 4 MG PO TBPK: ORAL_TABLET | ORAL | 0 refills | Status: DC

## 2022-06-07 NOTE — Progress Notes (Signed)
Subjective:  Patient ID: Patrick [name], male    DOB: 05-Mar-1961  Age: 62 y.o. MRN: [id number]  CC: No chief complaint on file.   HPI Patrick [name] presents for severe cough x 2 week, sinus congestion; dark green mucus. C/o wheezing COVID (-)  Outpatient Medications Prior to Visit  Medication Sig Dispense Refill   albuterol (VENTOLIN HFA) 108 (90 Base) MCG/ACT inhaler Inhale 2 puffs into the lungs every 6 (six) hours as needed for wheezing or shortness of breath (Cough). 36 g 0   aspirin 81 MG EC tablet Take 81 mg by mouth daily.      cetirizine (ZYRTEC) 10 MG tablet Take 10 mg by mouth at bedtime.      cholecalciferol (VITAMIN D3) 25 MCG (1000 UNIT) tablet Take 1,000 Units by mouth daily.      fluticasone (FLONASE) 50 MCG/ACT nasal spray Place 1 spray into both nostrils daily. Begin by using 2 sprays in each nare daily for 3 to 5 days, then decrease to 1 spray in each nare daily. 15.8 mL 2   Ginger, Zingiber officinalis, (GINGER ROOT) 550 MG CAPS Take 550 mg by mouth daily.      ipratropium (ATROVENT) 0.06 % nasal spray 2 spray into both nostrils three times a day for  - USE PRN     Krill Oil 300 MG CAPS      Misc Natural Products (TART CHERRY ADVANCED) CAPS Take 1 capsule by mouth daily.      Multiple Vitamin (MULTIVITAMIN WITH MINERALS) TABS tablet Take 1 tablet by mouth daily. Men's Over 50      Olopatadine HCl (PATADAY) 0.2 % SOLN Apply 1 drop to eye daily. 2.5 mL 1   rosuvastatin (CRESTOR) 20 MG tablet Take 1 tablet (20 mg total) by mouth daily. 90 tablet 3   telmisartan (MICARDIS) 80 MG tablet TAKE ONE TABLET BY MOUTH DAILY 90 tablet 3   Turmeric Curcumin 500 MG CAPS Take 500 mg by mouth daily.      aspirin (ASPIRIN 81) 81 MG chewable tablet Aspirin 81     No facility-administered medications prior to visit.    ROS: Review of Systems  Constitutional:  Positive for fatigue. Negative for appetite change and unexpected weight change.  HENT:  Positive for congestion, postnasal  drip, rhinorrhea and sinus pressure. Negative for nosebleeds, sneezing, sore throat and trouble swallowing.   Eyes:  Negative for itching and visual disturbance.  Respiratory:  Positive for cough and wheezing.   Cardiovascular:  Negative for chest pain, palpitations and leg swelling.  Gastrointestinal:  Negative for abdominal distention, blood in stool, diarrhea and nausea.  Genitourinary:  Negative for frequency and hematuria.  Musculoskeletal:  Negative for back pain, gait problem, joint swelling and neck pain.  Skin:  Negative for rash.  Neurological:  Negative for dizziness, tremors, speech difficulty and weakness.  Psychiatric/Behavioral:  Negative for agitation, dysphoric mood and sleep disturbance. The patient is not nervous/anxious.     Objective:  BP 128/80 (BP Location: Left Arm, Patient Position: Sitting, Cuff Size: Normal)   Pulse (!) 117   Temp 99.6 F (37.6 C) (Oral)   Ht 5\' 10"  (1.778 m)   Wt 233 lb (105.7 kg)   SpO2 95%   BMI 33.43 kg/m   BP Readings from Last 3 Encounters:  06/07/22 128/80  01/18/22 128/80  01/02/22 128/83    Wt Readings from Last 3 Encounters:  06/07/22 233 lb (105.7 kg)  01/18/22 230 lb (104.3 kg)  08/24/21 233  lb (105.7 kg)    Physical Exam Constitutional:      General: He is not in acute distress.    Appearance: He is well-developed. He is obese.     Comments: NAD  HENT:     Nose: Congestion present.     Mouth/Throat:     Pharynx: Posterior oropharyngeal erythema present.  Eyes:     Conjunctiva/sclera: Conjunctivae normal.     Pupils: Pupils are equal, round, and reactive to light.  Neck:     Thyroid: No thyromegaly.     Vascular: No JVD.  Cardiovascular:     Rate and Rhythm: Normal rate and regular rhythm.     Heart sounds: Normal heart sounds. No murmur heard.    No friction rub. No gallop.  Pulmonary:     Effort: Pulmonary effort is normal. No respiratory distress.     Breath sounds: Normal breath sounds. No wheezing or  rales.  Chest:     Chest wall: No tenderness.  Abdominal:     General: Bowel sounds are normal. There is no distension.     Palpations: Abdomen is soft. There is no mass.     Tenderness: There is no abdominal tenderness. There is no guarding or rebound.  Musculoskeletal:        General: No tenderness. Normal range of motion.     Cervical back: Normal range of motion.  Lymphadenopathy:     Cervical: No cervical adenopathy.  Skin:    General: Skin is warm and dry.     Findings: No rash.  Neurological:     Mental Status: He is alert and oriented to person, place, and time.     Cranial Nerves: No cranial nerve deficit.     Motor: No abnormal muscle tone.     Coordination: Coordination normal.     Gait: Gait normal.     Deep Tendon Reflexes: Reflexes are normal and symmetric.  Psychiatric:        Behavior: Behavior normal.        Thought Content: Thought content normal.        Judgment: Judgment normal.     Lab Results  Component Value Date   WBC 5.8 08/21/2021   HGB 15.4 08/21/2021   HCT 44.9 08/21/2021   PLT 199.0 08/21/2021   GLUCOSE 95 08/21/2021   CHOL 121 08/21/2021   TRIG 111.0 08/21/2021   HDL 53.50 08/21/2021   LDLCALC 45 08/21/2021   ALT 32 08/21/2021   AST 23 08/21/2021   NA 142 08/21/2021   K 4.6 08/21/2021   CL 107 08/21/2021   CREATININE 0.90 08/21/2021   BUN 18 08/21/2021   CO2 30 08/21/2021   TSH 1.68 08/21/2021   PSA 0.49 08/21/2021   HGBA1C 5.7 08/21/2021    No results found.  Assessment & Plan:   Problem List Items Addressed This Visit       Respiratory   Sinusitis    New Omnicef po x 10 d      Relevant Medications   cefdinir (OMNICEF) 300 MG capsule   promethazine-dextromethorphan (PROMETHAZINE-DM) 6.25-15 MG/5ML syrup   methylPREDNISolone (MEDROL DOSEPAK) 4 MG TBPK tablet   Other allergic rhinitis    Zyrtec po qd      Asthmatic bronchitis - Primary    Recurrent Albuterol MDI  Medrol pac      Relevant Medications    methylPREDNISolone (MEDROL DOSEPAK) 4 MG TBPK tablet      Meds ordered this encounter  Medications  cefdinir (OMNICEF) 300 MG capsule    Sig: Take 1 capsule (300 mg total) by mouth 2 (two) times daily.    Dispense:  20 capsule    Refill:  0   promethazine-dextromethorphan (PROMETHAZINE-DM) 6.25-15 MG/5ML syrup    Sig: Take 5 mLs by mouth 4 (four) times daily as needed for cough.    Dispense:  240 mL    Refill:  0   methylPREDNISolone (MEDROL DOSEPAK) 4 MG TBPK tablet    Sig: As directed    Dispense:  21 tablet    Refill:  0      Follow-up: Return for f/u with PCP.  Walker Kehr, MD

## 2022-06-07 NOTE — Assessment & Plan Note (Signed)
Recurrent Albuterol MDI  Medrol pac

## 2022-06-07 NOTE — Assessment & Plan Note (Signed)
Zyrtec po qd

## 2022-06-07 NOTE — Assessment & Plan Note (Addendum)
New Omnicef po x 10 d

## 2022-06-20 ENCOUNTER — Telehealth: Payer: Self-pay | Admitting: Internal Medicine

## 2022-06-20 NOTE — Telephone Encounter (Signed)
Pt called stating he is still coughing and having head congestion running nose with lots of mucus. Pt wants to know if he can get some medication because he is completely out and he is not completely well.

## 2022-06-20 NOTE — Telephone Encounter (Signed)
Pt saw Dr. Macario Golds on 06/07/22. Pls advise../l,mb

## 2022-06-22 MED ORDER — PROMETHAZINE-DM 6.25-15 MG/5ML PO SYRP: 5.0000 mL | ORAL_SOLUTION | Freq: Four times a day (QID) | ORAL | 0 refills | Status: DC | PRN

## 2022-06-22 NOTE — Telephone Encounter (Signed)
I renewed his cough syrup. Please see Dr. Jonny Ruiz or another provider if not well.  Thanks

## 2022-06-22 NOTE — Telephone Encounter (Signed)
Called pt and notified him of MD response and medication that was sent in, pt verbalized understanding

## 2022-06-26 DIAGNOSIS — L820 Inflamed seborrheic keratosis: Secondary | ICD-10-CM | POA: Diagnosis not present

## 2022-06-26 DIAGNOSIS — L821 Other seborrheic keratosis: Secondary | ICD-10-CM | POA: Diagnosis not present

## 2022-06-26 DIAGNOSIS — D225 Melanocytic nevi of trunk: Secondary | ICD-10-CM | POA: Diagnosis not present

## 2022-07-04 ENCOUNTER — Encounter: Payer: Self-pay | Admitting: Gastroenterology

## 2022-08-09 ENCOUNTER — Telehealth: Payer: Self-pay | Admitting: Internal Medicine

## 2022-08-09 NOTE — Telephone Encounter (Signed)
Patient is a friend and has requested I do his next colonoscopy  Please arrange direct colonoscopy if ok with Dr. Russella Dar

## 2022-08-09 NOTE — Telephone Encounter (Signed)
OK with me to transfer his care to [facility].

## 2022-08-13 NOTE — Telephone Encounter (Signed)
Please get him previsit + colonoscopy date

## 2022-08-14 NOTE — Telephone Encounter (Signed)
I left him a detailed message to please call us back and we will set him up for a pre-visit and colonoscopy appointment.

## 2022-08-15 ENCOUNTER — Encounter: Payer: Self-pay | Admitting: Internal Medicine

## 2022-08-15 DIAGNOSIS — D2272 Melanocytic nevi of left lower limb, including hip: Secondary | ICD-10-CM | POA: Diagnosis not present

## 2022-08-15 DIAGNOSIS — D225 Melanocytic nevi of trunk: Secondary | ICD-10-CM | POA: Diagnosis not present

## 2022-08-15 DIAGNOSIS — L821 Other seborrheic keratosis: Secondary | ICD-10-CM | POA: Diagnosis not present

## 2022-08-15 DIAGNOSIS — D2361 Other benign neoplasm of skin of right upper limb, including shoulder: Secondary | ICD-10-CM | POA: Diagnosis not present

## 2022-08-15 DIAGNOSIS — L578 Other skin changes due to chronic exposure to nonionizing radiation: Secondary | ICD-10-CM | POA: Diagnosis not present

## 2022-08-15 DIAGNOSIS — L819 Disorder of pigmentation, unspecified: Secondary | ICD-10-CM | POA: Diagnosis not present

## 2022-08-15 DIAGNOSIS — D2271 Melanocytic nevi of right lower limb, including hip: Secondary | ICD-10-CM | POA: Diagnosis not present

## 2022-08-15 NOTE — Telephone Encounter (Signed)
Pt PV scheduled 7/24 at 10:30 and [facility] is 8/14 at 9:30

## 2022-08-27 ENCOUNTER — Other Ambulatory Visit (INDEPENDENT_AMBULATORY_CARE_PROVIDER_SITE_OTHER): Payer: 59

## 2022-08-27 DIAGNOSIS — E7800 Pure hypercholesterolemia, unspecified: Secondary | ICD-10-CM

## 2022-08-27 DIAGNOSIS — E559 Vitamin D deficiency, unspecified: Secondary | ICD-10-CM

## 2022-08-27 DIAGNOSIS — E538 Deficiency of other specified B group vitamins: Secondary | ICD-10-CM | POA: Diagnosis not present

## 2022-08-27 DIAGNOSIS — Z125 Encounter for screening for malignant neoplasm of prostate: Secondary | ICD-10-CM

## 2022-08-27 DIAGNOSIS — R739 Hyperglycemia, unspecified: Secondary | ICD-10-CM | POA: Diagnosis not present

## 2022-08-27 LAB — CBC WITH DIFFERENTIAL/PLATELET
Basophils Absolute: 0.1 10*3/uL (ref 0.0–0.1)
Basophils Relative: 1 % (ref 0.0–3.0)
Eosinophils Absolute: 0.3 10*3/uL (ref 0.0–0.7)
Eosinophils Relative: 5.2 % — ABNORMAL HIGH (ref 0.0–5.0)
HCT: 40.3 % (ref 39.0–52.0)
Hemoglobin: 13.3 g/dL (ref 13.0–17.0)
Lymphocytes Relative: 24.8 % (ref 12.0–46.0)
Lymphs Abs: 1.4 10*3/uL (ref 0.7–4.0)
MCHC: 33.1 g/dL (ref 30.0–36.0)
MCV: 89.3 fl (ref 78.0–100.0)
Monocytes Absolute: 0.6 10*3/uL (ref 0.1–1.0)
Monocytes Relative: 11.4 % (ref 3.0–12.0)
Neutro Abs: 3.2 10*3/uL (ref 1.4–7.7)
Neutrophils Relative %: 57.6 % (ref 43.0–77.0)
Platelets: 212 10*3/uL (ref 150.0–400.0)
RBC: 4.51 Mil/uL (ref 4.22–5.81)
RDW: 13.3 % (ref 11.5–15.5)
WBC: 5.5 10*3/uL (ref 4.0–10.5)

## 2022-08-27 LAB — BASIC METABOLIC PANEL
BUN: 16 mg/dL (ref 6–23)
CO2: 29 mEq/L (ref 19–32)
Calcium: 9.1 mg/dL (ref 8.4–10.5)
Chloride: 104 mEq/L (ref 96–112)
Creatinine, Ser: 0.83 mg/dL (ref 0.40–1.50)
GFR: 94.5 mL/min (ref >60.00)
Glucose, Bld: 94 mg/dL (ref 70–99)
Potassium: 4.5 mEq/L (ref 3.5–5.1)
Sodium: 139 mEq/L (ref 135–145)

## 2022-08-27 LAB — HEPATIC FUNCTION PANEL
ALT: 27 U/L (ref 0–53)
AST: 22 U/L (ref 0–37)
Albumin: 4.1 g/dL (ref 3.5–5.2)
Alkaline Phosphatase: 54 U/L (ref 39–117)
Bilirubin, Direct: 0.1 mg/dL (ref 0.0–0.3)
Total Bilirubin: 0.7 mg/dL (ref 0.2–1.2)
Total Protein: 6.4 g/dL (ref 6.0–8.3)

## 2022-08-27 LAB — URINALYSIS, ROUTINE W REFLEX MICROSCOPIC
Bilirubin Urine: NEGATIVE
Hgb urine dipstick: NEGATIVE
Ketones, ur: NEGATIVE
Leukocytes,Ua: NEGATIVE
Nitrite: NEGATIVE
RBC / HPF: NONE SEEN (ref 0–?)
Specific Gravity, Urine: 1.025 (ref 1.000–1.030)
Total Protein, Urine: NEGATIVE
Urine Glucose: NEGATIVE
Urobilinogen, UA: 0.2 (ref 0.0–1.0)
pH: 6 (ref 5.0–8.0)

## 2022-08-27 LAB — HEMOGLOBIN A1C: Hgb A1c MFr Bld: 5.2 % (ref 4.6–6.5)

## 2022-08-27 LAB — LIPID PANEL
Cholesterol: 111 mg/dL (ref 0–200)
HDL: 41.8 mg/dL (ref >39.00)
LDL Cholesterol: 37 mg/dL (ref 0–99)
NonHDL: 68.92
Total CHOL/HDL Ratio: 3
Triglycerides: 160 mg/dL — ABNORMAL HIGH (ref 0.0–149.0)
VLDL: 32 mg/dL (ref 0.0–40.0)

## 2022-08-27 LAB — VITAMIN D 25 HYDROXY (VIT D DEFICIENCY, FRACTURES): VITD: 67.2 ng/mL (ref 30.00–100.00)

## 2022-08-27 LAB — TSH: TSH: 1.62 u[IU]/mL (ref 0.35–5.50)

## 2022-08-27 LAB — PSA: PSA: 0.39 ng/mL (ref 0.10–4.00)

## 2022-08-27 LAB — VITAMIN B12: Vitamin B-12: 302 pg/mL (ref 211–911)

## 2022-08-30 ENCOUNTER — Ambulatory Visit (INDEPENDENT_AMBULATORY_CARE_PROVIDER_SITE_OTHER): Payer: 59 | Admitting: Internal Medicine

## 2022-08-30 ENCOUNTER — Encounter: Payer: Self-pay | Admitting: Internal Medicine

## 2022-08-30 VITALS — BP 120/68 | HR 70 | Temp 98.3°F | Ht 70.0 in | Wt 235.0 lb

## 2022-08-30 DIAGNOSIS — I10 Essential (primary) hypertension: Secondary | ICD-10-CM

## 2022-08-30 DIAGNOSIS — R739 Hyperglycemia, unspecified: Secondary | ICD-10-CM

## 2022-08-30 DIAGNOSIS — E538 Deficiency of other specified B group vitamins: Secondary | ICD-10-CM

## 2022-08-30 DIAGNOSIS — Z0001 Encounter for general adult medical examination with abnormal findings: Secondary | ICD-10-CM

## 2022-08-30 DIAGNOSIS — Z125 Encounter for screening for malignant neoplasm of prostate: Secondary | ICD-10-CM

## 2022-08-30 DIAGNOSIS — E559 Vitamin D deficiency, unspecified: Secondary | ICD-10-CM

## 2022-08-30 MED ORDER — ROSUVASTATIN CALCIUM 20 MG PO TABS: 20.0000 mg | ORAL_TABLET | Freq: Every day | ORAL | 3 refills | Status: DC

## 2022-08-30 MED ORDER — TELMISARTAN 80 MG PO TABS: 80.0000 mg | ORAL_TABLET | Freq: Every day | ORAL | 3 refills | Status: DC

## 2022-08-30 NOTE — Patient Instructions (Signed)

## 2022-08-30 NOTE — Progress Notes (Signed)
Patient ID: Patrick [name], male   DOB: Apr 14, 1960, 62 y.o.   MRN: [id number]         Chief Complaint:: wellness exam and low b12       HPI:  Patrick [name] is a 62 y.o. male here for wellness exam; sched for colonoscopy Aug 2024 with Dr Leone Payor; o/w up to date   Wt Readings from Last 3 Encounters:  08/30/22 235 lb (106.6 kg)  06/07/22 233 lb (105.7 kg)  01/18/22 230 lb (104.3 kg)   BP Readings from Last 3 Encounters:  08/30/22 120/68  06/07/22 128/80  01/18/22 128/80   Immunization History  Administered Date(s) Administered   Moderna Sars-Covid-2 Vaccination 05/20/2019, 06/17/2019, 01/25/2020   Pneumococcal Conjugate-13 08/18/2020   Tdap 02/21/2015   Zoster Recombinant(Shingrix) 08/29/2020, 11/03/2020   Health Maintenance Due  Topic Date Due   Colonoscopy  07/23/2022      Past Medical History:  Diagnosis Date   Allergy    Arthritis    left knee   Dysrhythmia    " [name]"   Heart murmur    History of hiatal hernia    4 years ago per patient- no surgery needed   History of kidney stones    Hypertension    Past Surgical History:  Procedure Laterality Date   APPENDECTOMY     Arthroscopic knee surgeries on both knees     CHOLECYSTECTOMY     NASAL SEPTOPLASTY W/ TURBINOPLASTY Bilateral 04/13/2019   Procedure: NASAL SEPTOPLASTY WITH BILATERAL TURBINATE REDUCTION;  Surgeon: Newman Pies, MD;  Location: [facility];  Service: ENT;  Laterality: Bilateral;   TOTAL KNEE ARTHROPLASTY Left 01/21/2020   Procedure: LEFT TOTAL KNEE ARTHROPLASTY;  Surgeon: Cammy Copa, MD;  Location: [facility];  Service: Orthopedics;  Laterality: Left;   VASECTOMY      reports that he has quit smoking. His smoking use included cigars and cigarettes. He has never used smokeless tobacco. He reports current alcohol use of about 5.0 standard drinks of alcohol per week. He reports that he does not use drugs. family history is not on file. He was  adopted. Allergies  Allergen Reactions   Penicillins Hives    Rash around neck  Has patient had a PCN reaction causing immediate rash, facial/tongue/throat swelling, SOB or lightheadedness with hypotension: Yes Has patient had a PCN reaction causing severe rash involving mucus membranes or skin necrosis: No Has patient had a PCN reaction that required hospitalization: No Has patient had a PCN reaction occurring within the last 10 years: No If all of the above answers are "NO", then may proceed with Cephalosporin use.   Penicillin G Other (See Comments)   Current Outpatient Medications on File Prior to Visit  Medication Sig Dispense Refill   albuterol (VENTOLIN HFA) 108 (90 Base) MCG/ACT inhaler Inhale 2 puffs into the lungs every 6 (six) hours as needed for wheezing or shortness of breath (Cough). 36 g 0   aspirin 81 MG EC tablet Take 81 mg by mouth daily.      cetirizine (ZYRTEC) 10 MG tablet Take 10 mg by mouth at bedtime.      cholecalciferol (VITAMIN D3) 25 MCG (1000 UNIT) tablet Take 1,000 Units by mouth daily.      fluticasone (FLONASE) 50 MCG/ACT nasal spray Place 1 spray into both nostrils daily. Begin by using 2 sprays in each nare daily for 3 to 5 days, then decrease to 1 spray in  each nare daily. 15.8 mL 2   Ginger, Zingiber officinalis, (GINGER ROOT) 550 MG CAPS Take 550 mg by mouth daily.      Krill Oil 300 MG CAPS      Misc Natural Products (TART CHERRY ADVANCED) CAPS Take 1 capsule by mouth daily.      Multiple Vitamin (MULTIVITAMIN WITH MINERALS) TABS tablet Take 1 tablet by mouth daily. Men's Over 50      Olopatadine HCl (PATADAY) 0.2 % SOLN Apply 1 drop to eye daily. 2.5 mL 1   Turmeric Curcumin 500 MG CAPS Take 500 mg by mouth daily.      No current facility-administered medications on file prior to visit.        ROS:  All others reviewed and negative.  Objective        PE:  BP 120/68 (BP Location: Right Arm, Patient Position: Sitting, Cuff Size: Normal)   Pulse 70    Temp 98.3 F (36.8 C) (Oral)   Ht 5\' 10"  (1.778 m)   Wt 235 lb (106.6 kg)   SpO2 98%   BMI 33.72 kg/m                 Constitutional: Pt appears in NAD               HENT: Head: NCAT.                Right Ear: External ear normal.                 Left Ear: External ear normal.                Eyes: . Pupils are equal, round, and reactive to light. Conjunctivae and EOM are normal               Nose: without d/c or deformity               Neck: Neck supple. Gross normal ROM               Cardiovascular: Normal rate and regular rhythm.                 Pulmonary/Chest: Effort normal and breath sounds without rales or wheezing.                Abd:  Soft, NT, ND, + BS, no organomegaly               Neurological: Pt is alert. At baseline orientation, motor grossly intact               Skin: Skin is warm. No rashes, no other new lesions, LE edema - none               Psychiatric: Pt behavior is normal without agitation   Micro: none  Cardiac tracings I have personally interpreted today:  none  Pertinent Radiological findings (summarize): none   Lab Results  Component Value Date   WBC 5.5 08/27/2022   HGB 13.3 08/27/2022   HCT 40.3 08/27/2022   PLT 212.0 08/27/2022   GLUCOSE 94 08/27/2022   CHOL 111 08/27/2022   TRIG 160.0 (H) 08/27/2022   HDL 41.80 08/27/2022   LDLCALC 37 08/27/2022   ALT 27 08/27/2022   AST 22 08/27/2022   NA 139 08/27/2022   K 4.5 08/27/2022   CL 104 08/27/2022   CREATININE 0.83 08/27/2022   BUN 16 08/27/2022   CO2 29 08/27/2022   TSH 1.62  08/27/2022   PSA 0.39 08/27/2022   HGBA1C 5.2 08/27/2022   Assessment/Plan:  Patrick [name] is a 62 y.o. White or Caucasian [1] male with  has a past medical history of Allergy, Arthritis, Dysrhythmia, Heart murmur, History of hiatal hernia, History of kidney stones, and Hypertension.  Encounter for well adult exam with abnormal findings Age and sex appropriate education and counseling updated with regular exercise  and diet Referrals for preventative services - for colonoscopy aug 2024  Immunizations addressed - none needed Smoking counseling  - none needed Evidence for depression or other mood disorder - none significant Most recent labs reviewed. I have personally reviewed and have noted: 1) the patient's medical and social history 2) The patient's current medications and supplements 3) The patient's height, weight, and BMI have been recorded in the chart   B12 deficiency Lab Results  Component Value Date   VITAMINB12 302 08/27/2022   Low, to start oral replacement - b12 1000 mcg qd   Vitamin D deficiency Last vitamin D Lab Results  Component Value Date   VD25OH 67.20 08/27/2022   Stable, cont oral replacement  Followup: Return in about 1 year (around 08/30/2023).  Oliver Barre, MD 09/02/2022 4:31 PM [facility]

## 2022-09-02 ENCOUNTER — Encounter: Payer: Self-pay | Admitting: Internal Medicine

## 2022-09-02 DIAGNOSIS — E538 Deficiency of other specified B group vitamins: Secondary | ICD-10-CM | POA: Insufficient documentation

## 2022-09-02 NOTE — Assessment & Plan Note (Signed)
Last vitamin D Lab Results  Component Value Date   VD25OH 67.20 08/27/2022   Stable, cont oral replacement

## 2022-09-02 NOTE — Assessment & Plan Note (Signed)
Age and sex appropriate education and counseling updated with regular exercise and diet Referrals for preventative services - for colonoscopy aug 2024  Immunizations addressed - none needed Smoking counseling  - none needed Evidence for depression or other mood disorder - none significant Most recent labs reviewed. I have personally reviewed and have noted: 1) the patient's medical and social history 2) The patient's current medications and supplements 3) The patient's height, weight, and BMI have been recorded in the chart

## 2022-09-02 NOTE — Assessment & Plan Note (Signed)
Lab Results  Component Value Date   VITAMINB12 302 08/27/2022   Low, to start oral replacement - b12 1000 mcg qd

## 2022-09-26 ENCOUNTER — Ambulatory Visit (AMBULATORY_SURGERY_CENTER): Payer: 59 | Admitting: *Deleted

## 2022-09-26 VITALS — Ht 70.0 in | Wt 228.0 lb

## 2022-09-26 DIAGNOSIS — Z86010 Personal history of colonic polyps: Secondary | ICD-10-CM

## 2022-09-26 NOTE — Progress Notes (Signed)
Pt's name and DOB verified at the beginning of the pre-visit.  Pt denies any difficulty with ambulating,sitting, laying down or rolling side to side Gave both [facility] main # and MD on call # prior to instructions.  No egg or soy allergy known to patient  No issues known to pt with past sedation with any surgeries or procedures Pt denies having issues being intubated Pt has no issues moving head neck or swallowing No FH of Malignant Hyperthermia Pt is not on diet pills Pt is not on home 02  Pt is not on blood thinners  Pt denies issues with constipation  Pt is not on dialysis Pt denise any abnormal heart rhythms  Pt denies any upcoming cardiac testing Pt encouraged to use to use Singlecare or Goodrx to reduce cost  Patient's chart reviewed by Patrick [name] CNRA prior to pre-visit and patient appropriate for the [facility].  Pre-visit completed and red dot placed by patient's name on their procedure day (on provider's schedule).  . Visit by phone Pt states weight is 228 lb Instructed pt why it is important to and  to call if they have any changes in health or new medications. Directed them to the # given and on instructions.   Pt states they will.  Instructions reviewed with pt and pt states understanding. Instructed to review again prior to procedure. Pt states they will.  Instructions sent by mail with coupon and by my chart

## 2022-10-04 ENCOUNTER — Encounter: Payer: Self-pay | Admitting: Internal Medicine

## 2022-10-17 ENCOUNTER — Encounter: Payer: Self-pay | Admitting: Internal Medicine

## 2022-10-17 ENCOUNTER — Encounter: Payer: 59 | Admitting: Internal Medicine

## 2022-10-17 ENCOUNTER — Ambulatory Visit (AMBULATORY_SURGERY_CENTER): Payer: 59 | Admitting: Internal Medicine

## 2022-10-17 VITALS — BP 121/49 | HR 68 | Temp 97.3°F | Resp 18 | Ht 70.0 in | Wt 228.0 lb

## 2022-10-17 DIAGNOSIS — D122 Benign neoplasm of ascending colon: Secondary | ICD-10-CM | POA: Diagnosis not present

## 2022-10-17 DIAGNOSIS — Z09 Encounter for follow-up examination after completed treatment for conditions other than malignant neoplasm: Secondary | ICD-10-CM

## 2022-10-17 DIAGNOSIS — Z86010 Personal history of colonic polyps: Secondary | ICD-10-CM | POA: Diagnosis not present

## 2022-10-17 DIAGNOSIS — Z860101 Personal history of adenomatous and serrated colon polyps: Secondary | ICD-10-CM | POA: Insufficient documentation

## 2022-10-17 DIAGNOSIS — Z1211 Encounter for screening for malignant neoplasm of colon: Secondary | ICD-10-CM | POA: Diagnosis not present

## 2022-10-17 DIAGNOSIS — E785 Hyperlipidemia, unspecified: Secondary | ICD-10-CM | POA: Diagnosis not present

## 2022-10-17 DIAGNOSIS — I10 Essential (primary) hypertension: Secondary | ICD-10-CM | POA: Diagnosis not present

## 2022-10-17 MED ORDER — SODIUM CHLORIDE 0.9 % IV SOLN: 500.0000 mL | Freq: Once | INTRAVENOUS | Status: DC

## 2022-10-17 NOTE — Progress Notes (Signed)
Pt's states no medical or surgical changes since previsit or office visit. 

## 2022-10-17 NOTE — Patient Instructions (Addendum)
Please read handouts provided. Continue present medications. Await pathology results.   YOU HAD AN ENDOSCOPIC PROCEDURE TODAY AT THE  ENDOSCOPY CENTER:   Refer to the procedure report that was given to you for any specific questions about what was found during the examination.  If the procedure report does not answer your questions, please call your gastroenterologist to clarify.  If you requested that your care partner not be given the details of your procedure findings, then the procedure report has been included in a sealed envelope for you to review at your convenience later.  YOU SHOULD EXPECT: Some feelings of bloating in the abdomen. Passage of more gas than usual.  Walking can help get rid of the air that was put into your GI tract during the procedure and reduce the bloating. If you had a lower endoscopy (such as a colonoscopy or flexible sigmoidoscopy) you may notice spotting of blood in your stool or on the toilet paper. If you underwent a bowel prep for your procedure, you may not have a normal bowel movement for a few days.  Please Note:  You might notice some irritation and congestion in your nose or some drainage.  This is from the oxygen used during your procedure.  There is no need for concern and it should clear up in a day or so.  SYMPTOMS TO REPORT IMMEDIATELY:  Following lower endoscopy (colonoscopy or flexible sigmoidoscopy):  Excessive amounts of blood in the stool  Significant tenderness or worsening of abdominal pains  Swelling of the abdomen that is new, acute  Fever of 100F or higher  For urgent or emergent issues, a gastroenterologist can be reached at any hour by calling [phone]. Do not use MyChart messaging for urgent concerns.    DIET:  We do recommend a small meal at first, but then you may proceed to your regular diet.  Drink plenty of fluids but you should avoid alcoholic beverages for 24 hours.  ACTIVITY:  You should plan to take it easy for  the rest of today and you should NOT DRIVE or use heavy machinery until tomorrow (because of the sedation medicines used during the test).    FOLLOW UP: Our staff will call the number listed on your records the next business day following your procedure.  We will call around 7:15- 8:00 am to check on you and address any questions or concerns that you may have regarding the information given to you following your procedure. If we do not reach you, we will leave a message.     If any biopsies were taken you will be contacted by phone or by letter within the next 1-3 weeks.  Please call us at [phone] if you have not heard about the biopsies in 3 weeks.    SIGNATURES/CONFIDENTIALITY: You and/or your care partner have signed paperwork which will be entered into your electronic medical record.  These signatures attest to the fact that that the information above on your After Visit Summary has been reviewed and is understood.  Full responsibility of the confidentiality of this discharge information lies with you and/or your care-partner.I found and removed one tiny polyp that looks benign.  I will let you know pathology results and when to have another routine colonoscopy by mail and/or My Chart.  You also have a condition called diverticulosis - common and not usually a problem. Please read the handout provided.  I appreciate the opportunity to care for you. Iva Boop, MD, [facility]

## 2022-10-17 NOTE — Progress Notes (Signed)
Vssnnad trans to pacu 

## 2022-10-17 NOTE — Progress Notes (Signed)
Called to room to assist during endoscopic procedure.  Patient ID and intended procedure confirmed with present staff. Received instructions for my participation in the procedure from the performing physician.  

## 2022-10-17 NOTE — Progress Notes (Signed)
[facility] History and Physical   Primary Care Physician:  Corwin Levins, MD   Reason for Procedure:    Encounter Diagnoses  Name Primary?   Personal history of colonic polyps Yes   Hx of adenomatous polyp of colon      Plan:    colonoscopy     HPI: Patrick [name] is a 62 y.o. male s/p removal 8 mm adenoma 2019   Past Medical History:  Diagnosis Date   Allergy    Arthritis    left knee   Dysrhythmia    " [name]"   Heart murmur    History of hiatal hernia    4 years ago per patient- no surgery needed   History of kidney stones    Hx of adenomatous polyp of colon    Hyperlipidemia    Hypertension     Past Surgical History:  Procedure Laterality Date   APPENDECTOMY     Arthroscopic knee surgeries on both knees     CHOLECYSTECTOMY     COLONOSCOPY     NASAL SEPTOPLASTY W/ TURBINOPLASTY Bilateral 04/13/2019   Procedure: NASAL SEPTOPLASTY WITH BILATERAL TURBINATE REDUCTION;  Surgeon: Newman Pies, MD;  Location: [facility];  Service: ENT;  Laterality: Bilateral;   TOTAL KNEE ARTHROPLASTY Left 01/21/2020   Procedure: LEFT TOTAL KNEE ARTHROPLASTY;  Surgeon: Cammy Copa, MD;  Location: [facility];  Service: Orthopedics;  Laterality: Left;   VASECTOMY      Prior to Admission medications   Medication Sig Start Date End Date Taking? Authorizing Provider  aspirin 81 MG EC tablet Take 81 mg by mouth daily.  03/05/98  Yes [provider]  cetirizine (ZYRTEC) 10 MG tablet Take 10 mg by mouth at bedtime.    Yes [provider]  cholecalciferol (VITAMIN D3) 25 MCG (1000 UNIT) tablet Take 1,000 Units by mouth daily.  11/02/18  Yes [provider]  Ginger, Zingiber officinalis, (GINGER ROOT) 550 MG CAPS Take 550 mg by mouth daily.    Yes [provider]  Providence Lanius 300 MG CAPS    Yes [provider]  Misc Natural Products (TART CHERRY ADVANCED) CAPS Take 1 capsule by mouth  daily.    Yes [provider]  Multiple Vitamin (MULTIVITAMIN WITH MINERALS) TABS tablet Take 1 tablet by mouth daily. Men's Over 50    Yes [provider]  rosuvastatin (CRESTOR) 20 MG tablet Take 1 tablet (20 mg total) by mouth daily. 08/30/22  Yes Corwin Levins, MD  telmisartan (MICARDIS) 80 MG tablet Take 1 tablet (80 mg total) by mouth daily. 08/30/22  Yes Corwin Levins, MD  Turmeric Curcumin 500 MG CAPS Take 500 mg by mouth daily.    Yes [provider]  vitamin B-12 (CYANOCOBALAMIN) 100 MCG tablet Take 100 mcg by mouth daily.   Yes [provider]  fluticasone (FLONASE) 50 MCG/ACT nasal spray Place 1 spray into both nostrils daily. Begin by using 2 sprays in each nare daily for 3 to 5 days, then decrease to 1 spray in each nare daily. Patient not taking: Reported on 09/26/2022 01/02/22   Theadora Rama Scales, PA-C    Current Outpatient Medications  Medication Sig Dispense Refill   aspirin 81 MG EC tablet Take 81 mg by mouth daily.      cetirizine (ZYRTEC) 10 MG tablet Take 10 mg by mouth at bedtime.      cholecalciferol (VITAMIN D3)  25 MCG (1000 UNIT) tablet Take 1,000 Units by mouth daily.      Ginger, Zingiber officinalis, (GINGER ROOT) 550 MG CAPS Take 550 mg by mouth daily.      Krill Oil 300 MG CAPS      Misc Natural Products (TART CHERRY ADVANCED) CAPS Take 1 capsule by mouth daily.      Multiple Vitamin (MULTIVITAMIN WITH MINERALS) TABS tablet Take 1 tablet by mouth daily. Men's Over 50      rosuvastatin (CRESTOR) 20 MG tablet Take 1 tablet (20 mg total) by mouth daily. 90 tablet 3   telmisartan (MICARDIS) 80 MG tablet Take 1 tablet (80 mg total) by mouth daily. 90 tablet 3   Turmeric Curcumin 500 MG CAPS Take 500 mg by mouth daily.      vitamin B-12 (CYANOCOBALAMIN) 100 MCG tablet Take 100 mcg by mouth daily.     fluticasone (FLONASE) 50 MCG/ACT nasal spray Place 1 spray into both nostrils daily. Begin by using 2 sprays in each nare daily for 3  to 5 days, then decrease to 1 spray in each nare daily. (Patient not taking: Reported on 09/26/2022) 15.8 mL 2   Current Facility-Administered Medications  Medication Dose Route Frequency Provider Last Rate Last Admin   0.9 %  sodium chloride infusion  500 mL Intravenous Once Iva Boop, MD        Allergies as of 10/17/2022 - Review Complete 10/17/2022  Allergen Reaction Noted   Penicillins Hives 11/11/2014   Penicillin g Other (See Comments) 03/21/2015    Family History  Adopted: Yes  Problem Relation Age of Onset   Colon cancer Neg Hx    Colon polyps Neg Hx    Esophageal cancer Neg Hx    Rectal cancer Neg Hx    Stomach cancer Neg Hx     Social History   Socioeconomic History   Marital status: Married    Spouse name: Not on file   Number of children: Not on file   Years of education: Not on file   Highest education level: Not on file  Occupational History   Occupation: Teacher, early years/pre, Network engineer, Semi-retired  Tobacco Use   Smoking status: Former    Types: Cigars, Cigarettes   Smokeless tobacco: Never   Tobacco comments:    one cigar every few years  Vaping Use   Vaping status: Never Used  Substance and Sexual Activity   Alcohol use: Yes    Alcohol/week: 5.0 standard drinks of alcohol    Types: 5 Standard drinks or equivalent per week   Drug use: No   Sexual activity: Yes  Other Topics Concern   Not on file  Social History Narrative   Married.   Education: [employer]   Exercise: Yes   Social Determinants of Health   Financial Resource Strain: Not on file  Food Insecurity: Not on file  Transportation Needs: Not on file  Physical Activity: Not on file  Stress: Not on file  Social Connections: Not on file  Intimate Partner Violence: Unknown (06/08/2021)   Received from [facility]   HITS    Physically Hurt: Not on file    Insult or Talk Down To: Not on file    Threaten Physical Harm: Not on file    Scream or Curse: Not on file    Review of Systems:  All  other review of systems negative except as mentioned in the HPI.  Physical Exam: Vital signs BP (!) 161/86   Pulse 87  Temp (!) 97.3 F (36.3 C) (Temporal)   Ht 5\' 10"  (1.778 m)   Wt 228 lb (103.4 kg)   SpO2 96%   BMI 32.71 kg/m   General:   Alert,  Well-developed, well-nourished, pleasant and cooperative in NAD Lungs:  Clear throughout to auscultation.   Heart:  Regular rate and rhythm; no murmurs, clicks, rubs,  or gallops. Abdomen:  Soft, nontender and nondistended. Normal bowel sounds.   Neuro/Psych:  Alert and cooperative. Normal mood and affect. A and O x 3   @  Sena Slate, MD, [facility] [phone] (pager) 10/17/2022 9:37 AM@

## 2022-10-17 NOTE — Op Note (Signed)
[facility] Patient Name: Patrick [name] Procedure Date: 10/17/2022 9:41 AM MRN: [id number] Endoscopist: Iva Boop , MD, [id number] Age: 62 Referring MD:  Date of Birth: 08-11-1960 Gender: Male Account #: [id number] Procedure:                Colonoscopy Indications:              Surveillance: Personal history of adenomatous                            polyps on last colonoscopy 5 years ago, Last                            colonoscopy: 2019 Medicines:                Monitored Anesthesia Care Procedure:                Pre-Anesthesia Assessment:                           - Prior to the procedure, a History and Physical                            was performed, and patient medications and                            allergies were reviewed. The patient's tolerance of                            previous anesthesia was also reviewed. The risks                            and benefits of the procedure and the sedation                            options and risks were discussed with the patient.                            All questions were answered, and informed consent                            was obtained. Prior Anticoagulants: The patient has                            taken no anticoagulant or antiplatelet agents. ASA                            Grade Assessment: II - A patient with mild systemic                            disease. After reviewing the risks and benefits,                            the patient was deemed in satisfactory condition to  undergo the procedure.                           After obtaining informed consent, the colonoscope                            was passed under direct vision. Throughout the                            procedure, the patient's blood pressure, pulse, and                            oxygen saturations were monitored continuously. The                            Olympus CF-HQ190L [id number] [id number]) Colonoscope was                             introduced through the anus and advanced to the the                            cecum, identified by appendiceal orifice and                            ileocecal valve. The colonoscopy was performed                            without difficulty. The patient tolerated the                            procedure well. The quality of the bowel                            preparation was good. The bowel preparation used                            was Miralax via split dose instruction. The                            ileocecal valve, appendiceal orifice, and rectum                            were photographed. Scope In: 9:50:03 AM Scope Out: 10:04:48 AM Scope Withdrawal Time: 0 hours 12 minutes 29 seconds  Total Procedure Duration: 0 hours 14 minutes 45 seconds  Findings:                 The perianal and digital rectal examinations were                            normal. Pertinent negatives include normal prostate                            (size, shape, and consistency).  A 5 mm polyp was found in the ascending colon. The                            polyp was sessile. The polyp was removed with a                            cold snare. Resection and retrieval were complete.                            Verification of patient identification for the                            specimen was done. Estimated blood loss was minimal.                           A few small-mouthed diverticula were found in the                            left colon.                           The exam was otherwise without abnormality on                            direct and retroflexion views. Complications:            No immediate complications. Estimated Blood Loss:     Estimated blood loss was minimal. Impression:               - One 5 mm polyp in the ascending colon, removed                            with a cold snare. Resected and retrieved.                           -  Diverticulosis in the left colon.                           - The examination was otherwise normal on direct                            and retroflexion views.                           - Personal history of colonic polyp - 8 mm adenoma                            removed 07/2017 Recommendation:           - Patient has a contact number available for                            emergencies. The signs and symptoms of potential  delayed complications were discussed with the                            patient. Return to normal activities tomorrow.                            Written discharge instructions were provided to the                            patient.                           - Resume previous diet.                           - Continue present medications.                           - Repeat colonoscopy is recommended for                            surveillance. The colonoscopy date will be                            determined after pathology results from today's                            exam become available for review. Iva Boop, MD 10/17/2022 10:15:40 AM This report has been signed electronically.

## 2022-10-18 ENCOUNTER — Telehealth: Payer: Self-pay | Admitting: *Deleted

## 2022-10-18 NOTE — Telephone Encounter (Signed)
Attempted to call patient for their post-procedure follow-up call. No answer. Left voicemail.   

## 2022-10-19 ENCOUNTER — Encounter: Payer: Self-pay | Admitting: Internal Medicine

## 2022-10-19 DIAGNOSIS — Z860101 Personal history of adenomatous and serrated colon polyps: Secondary | ICD-10-CM

## 2022-10-19 DIAGNOSIS — Z86010 Personal history of colonic polyps: Secondary | ICD-10-CM

## 2022-11-14 ENCOUNTER — Ambulatory Visit
Admission: EM | Admit: 2022-11-14 | Discharge: 2022-11-14 | Disposition: A | Discharge status: Home / Self Care | Payer: 59 | Attending: Internal Medicine | Admitting: Internal Medicine

## 2022-11-14 DIAGNOSIS — J0100 Acute maxillary sinusitis, unspecified: Secondary | ICD-10-CM

## 2022-11-14 MED ORDER — FLUTICASONE PROPIONATE 50 MCG/ACT NA SUSP
1.0000 | Freq: Every day | NASAL | 0 refills | Status: DC
Start: 2022-11-14 — End: 2023-05-27

## 2022-11-14 MED ORDER — AZITHROMYCIN 250 MG PO TABS
250.0000 mg | ORAL_TABLET | Freq: Every day | ORAL | 0 refills | Status: DC
Start: 2022-11-14 — End: 2023-03-14

## 2022-11-14 NOTE — Discharge Instructions (Signed)
Start Zithromax and Flonase.  Continue your nasal rinses/Nettie Potts.  Lots of rest and fluids.  I hope you feel better soon!

## 2022-11-14 NOTE — ED Provider Notes (Signed)
[facility]    CSN: [id number] Arrival date & time: 11/14/22  0801      History   Chief Complaint Chief Complaint  Patient presents with   Nasal Congestion    HPI Patrick [name] is a 62 y.o. male  presents for evaluation of URI symptoms for 7 days. Patient reports associated symptoms of sinus pain/pressure, congestion, postnasal drip causing a cough, scratchy throat. Denies N/V/D, fevers, ear pain, body aches, shortness of breath. Patient does not have a hx of asthma or smoking. No known sick contacts.  Pt has taken Flonase, [employer], antihistamines OTC for symptoms. Pt has no other concerns at this time.   HPI  Past Medical History:  Diagnosis Date   Allergy    Arthritis    left knee   Dysrhythmia    " [name]"   Heart murmur    History of hiatal hernia    4 years ago per patient- no surgery needed   History of kidney stones    Hx of adenomatous polyp of colon    Hyperlipidemia    Hypertension     Patient Active Problem List   Diagnosis Date Noted   Hx of adenomatous polyp of colon    B12 deficiency 09/02/2022   Asthmatic bronchitis 06/07/2022   Back pain 01/18/2022   Calcification of patellar tendon determined by X-ray 05/15/2021   Coronary artery calcification 12/08/2020   Melanocytic nevi of trunk 08/18/2020   Sebaceous cyst 08/18/2020   Seborrheic keratosis 08/18/2020   S/P total knee arthroplasty, 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   Acute bursitis of right shoulder 02/02/2019   Sinusitis 11/06/2018   Penicillin allergy 11/06/2018   Hip flexor tightness 09/29/2018   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   Hyperglycemia 08/02/2018   HLD (hyperlipidemia) 08/02/2018   Vitamin D deficiency 08/02/2018   Encounter for well adult exam with abnormal findings 07/30/2018   HTN (hypertension) 07/30/2018   Tinnitus aurium, 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   Snoring 05/09/2017   Other allergic  rhinitis 02/21/2015    Past Surgical History:  Procedure Laterality Date   APPENDECTOMY     Arthroscopic knee surgeries on both knees     CHOLECYSTECTOMY     COLONOSCOPY     NASAL SEPTOPLASTY W/ TURBINOPLASTY Bilateral 04/13/2019   Procedure: NASAL SEPTOPLASTY WITH BILATERAL TURBINATE REDUCTION;  Surgeon: Newman Pies, MD;  Location: [facility];  Service: ENT;  Laterality: Bilateral;   TOTAL KNEE ARTHROPLASTY Left 01/21/2020   Procedure: LEFT TOTAL KNEE ARTHROPLASTY;  Surgeon: Cammy Copa, MD;  Location: [facility];  Service: Orthopedics;  Laterality: Left;   VASECTOMY         Home Medications    Prior to Admission medications   Medication Sig Start Date End Date Taking? Authorizing Provider  azithromycin (ZITHROMAX) 250 MG tablet Take 1 tablet (250 mg total) by mouth daily. Take first 2 tablets together, then 1 every day until finished. 11/14/22  Yes Radford Pax, NP  fluticasone (FLONASE) 50 MCG/ACT nasal spray Place 1 spray into both nostrils daily. 11/14/22  Yes Radford Pax, NP  aspirin 81 MG EC tablet Take 81 mg by mouth daily.  03/05/98   [provider]  cetirizine (ZYRTEC) 10 MG tablet Take 10 mg by mouth at bedtime.     [provider]  cholecalciferol (VITAMIN D3) 25 MCG (1000 UNIT) tablet Take 1,000 Units by mouth daily.  11/02/18  [provider]  Ginger, Zingiber officinalis, (GINGER ROOT) 550 MG CAPS Take 550 mg by mouth daily.     [provider]  Providence Lanius 300 MG CAPS     [provider]  Misc Natural Products (TART CHERRY ADVANCED) CAPS Take 1 capsule by mouth daily.     [provider]  Multiple Vitamin (MULTIVITAMIN WITH MINERALS) TABS tablet Take 1 tablet by mouth daily. Men's Over 50     [provider]  rosuvastatin (CRESTOR) 20 MG tablet Take 1 tablet (20 mg total) by mouth daily. 08/30/22   Corwin Levins, MD  telmisartan (MICARDIS) 80 MG tablet Take 1 tablet (80 mg total) by mouth  daily. 08/30/22   Corwin Levins, MD  Turmeric Curcumin 500 MG CAPS Take 500 mg by mouth daily.     [provider]  vitamin B-12 (CYANOCOBALAMIN) 100 MCG tablet Take 100 mcg by mouth daily.    [provider]    Family History Family History  Adopted: Yes  Problem Relation Age of Onset   Colon cancer Neg Hx    Colon polyps Neg Hx    Esophageal cancer Neg Hx    Rectal cancer Neg Hx    Stomach cancer Neg Hx     Social History Social History   Tobacco Use   Smoking status: Former    Types: Cigars, Cigarettes   Smokeless tobacco: Never   Tobacco comments:    one cigar every few years  Vaping Use   Vaping status: Never Used  Substance Use Topics   Alcohol use: Yes    Alcohol/week: 5.0 standard drinks of alcohol    Types: 5 Standard drinks or equivalent per week   Drug use: No     Allergies   Penicillins and Penicillin g   Review of Systems Review of Systems  HENT:  Positive for congestion, postnasal drip, sinus pressure and sinus pain.      Physical Exam Triage Vital Signs ED Triage Vitals  Encounter Vitals Group     BP 11/14/22 0813 (!) 181/92     Systolic BP Percentile --      Diastolic BP Percentile --      Pulse Rate 11/14/22 0813 91     Resp 11/14/22 0813 16     Temp 11/14/22 0813 98.2 F (36.8 C)     Temp Source 11/14/22 0813 Oral     SpO2 11/14/22 0813 95 %     Weight --      Height --      Head Circumference --      Peak Flow --      Pain Score 11/14/22 0815 1     Pain Loc --      Pain Education --      Exclude from Growth Chart --    No data found.  Updated Vital Signs BP (!) 181/92 (BP Location: Right Arm)   Pulse 91   Temp 98.2 F (36.8 C) (Oral)   Resp 16   SpO2 95%   Visual Acuity Right Eye Distance:   Left Eye Distance:   Bilateral Distance:    Right Eye Near:   Left Eye Near:    Bilateral Near:     Physical Exam Vitals and nursing note reviewed.  Constitutional:      General: He is not in acute  distress.    Appearance: Normal appearance. He is not ill-appearing or toxic-appearing.  HENT:     Head: Normocephalic  and atraumatic.     Right Ear: Tympanic membrane and ear canal normal.     Left Ear: Tympanic membrane and ear canal normal.     Nose: Congestion present.     Right Turbinates: Swollen and pale.     Left Turbinates: Swollen and pale.     Right Sinus: Maxillary sinus tenderness present.     Left Sinus: Maxillary sinus tenderness present.     Mouth/Throat:     Mouth: Mucous membranes are moist.     Pharynx: No posterior oropharyngeal erythema.  Eyes:     Pupils: Pupils are equal, round, and reactive to light.  Cardiovascular:     Rate and Rhythm: Normal rate and regular rhythm.     Heart sounds: Normal heart sounds.  Pulmonary:     Effort: Pulmonary effort is normal.     Breath sounds: Normal breath sounds.  Musculoskeletal:     Cervical back: Normal range of motion and neck supple.  Lymphadenopathy:     Cervical: No cervical adenopathy.  Skin:    General: Skin is warm and dry.  Neurological:     General: No focal deficit present.     Mental Status: He is alert and oriented to person, place, and time.  Psychiatric:        Mood and Affect: Mood normal.        Behavior: Behavior normal.      UC Treatments / Results  Labs (all labs ordered are listed, but only abnormal results are displayed) Labs Reviewed - No data to display  EKG   Radiology No results found.  Procedures Procedures (including critical care time)  Medications Ordered in UC Medications - No data to display  Initial Impression / Assessment and Plan / UC Course  I have reviewed the triage vital signs and the nursing notes.  Pertinent labs & imaging results that were available during my care of the patient were reviewed by me and considered in my medical decision making (see chart for details).     Reviewed exam and symptoms with patient.  No red flags.  Start Zithromax and  refilled Flonase.  Continue [place] Penton as needed.  PCP follow-up if symptoms do not improve.  ER precautions reviewed and patient verbalized understanding. Final Clinical Impressions(s) / UC Diagnoses   Final diagnoses:  Acute maxillary sinusitis, recurrence not specified     Discharge Instructions      Start Zithromax and Flonase.  Continue your nasal rinses/Nettie Potts.  Lots of rest and fluids.  I hope you feel better soon!   ED Prescriptions     Medication Sig Dispense Auth. Provider   fluticasone (FLONASE) 50 MCG/ACT nasal spray Place 1 spray into both nostrils daily. 15.8 mL Radford Pax, NP   azithromycin (ZITHROMAX) 250 MG tablet Take 1 tablet (250 mg total) by mouth daily. Take first 2 tablets together, then 1 every day until finished. 6 tablet Radford Pax, NP      PDMP not reviewed this encounter.   Radford Pax, NP 11/14/22 [phone]

## 2022-11-14 NOTE — ED Triage Notes (Signed)
Pt presents to UC w/ c/o nasal congestion, cough, scratchy throat x1 week. Netti pot, flonase, antihistamine pill without relief.

## 2022-12-13 ENCOUNTER — Other Ambulatory Visit: Payer: Self-pay | Admitting: Medical Genetics

## 2022-12-13 DIAGNOSIS — Z006 Encounter for examination for normal comparison and control in clinical research program: Secondary | ICD-10-CM

## 2023-01-07 ENCOUNTER — Other Ambulatory Visit
Admission: RE | Admit: 2023-01-07 | Discharge: 2023-01-07 | Disposition: A | Discharge status: Home / Self Care | Payer: 59 | Source: Ambulatory Visit | Attending: Medical Genetics | Admitting: Medical Genetics

## 2023-01-07 DIAGNOSIS — Z006 Encounter for examination for normal comparison and control in clinical research program: Secondary | ICD-10-CM | POA: Insufficient documentation

## 2023-01-10 ENCOUNTER — Other Ambulatory Visit: Payer: Self-pay

## 2023-01-10 ENCOUNTER — Ambulatory Visit (INDEPENDENT_AMBULATORY_CARE_PROVIDER_SITE_OTHER): Payer: 59

## 2023-01-10 ENCOUNTER — Ambulatory Visit: Payer: 59 | Admitting: Family Medicine

## 2023-01-10 ENCOUNTER — Encounter: Payer: Self-pay | Admitting: Family Medicine

## 2023-01-10 VITALS — BP 128/82 | HR 82 | Ht 70.0 in | Wt 238.0 lb

## 2023-01-10 DIAGNOSIS — R202 Paresthesia of skin: Secondary | ICD-10-CM | POA: Diagnosis not present

## 2023-01-10 DIAGNOSIS — M25522 Pain in left elbow: Secondary | ICD-10-CM | POA: Diagnosis not present

## 2023-01-10 DIAGNOSIS — G5622 Lesion of ulnar nerve, left upper limb: Secondary | ICD-10-CM | POA: Diagnosis not present

## 2023-01-10 DIAGNOSIS — M542 Cervicalgia: Secondary | ICD-10-CM | POA: Diagnosis not present

## 2023-01-10 DIAGNOSIS — M25521 Pain in right elbow: Secondary | ICD-10-CM | POA: Diagnosis not present

## 2023-01-10 DIAGNOSIS — G5620 Lesion of ulnar nerve, unspecified upper limb: Secondary | ICD-10-CM | POA: Insufficient documentation

## 2023-01-10 DIAGNOSIS — M5030 Other cervical disc degeneration, unspecified cervical region: Secondary | ICD-10-CM | POA: Diagnosis not present

## 2023-01-10 DIAGNOSIS — M4802 Spinal stenosis, cervical region: Secondary | ICD-10-CM | POA: Diagnosis not present

## 2023-01-10 MED ORDER — GABAPENTIN 100 MG PO CAPS: 200.0000 mg | ORAL_CAPSULE | Freq: Every day | ORAL | 0 refills | Status: DC

## 2023-01-10 NOTE — Assessment & Plan Note (Signed)
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  Discussed compression, gabapentin, avoiding certain activities and compression in the area.  Discussed icing regimen and home exercises.  Discussed icing regimen and home exercises otherwise.  Patient will follow-up again in 6 to 8 weeks to see how patient responds.

## 2023-01-10 NOTE — Progress Notes (Signed)
[facility] [address] [place] [address] Phone: [phone] Subjective:   Patrick [name], am serving as a scribe for Dr. Antoine Primas.  I'm seeing this patient by the request  of:  Corwin Levins, MD  CC: Elbow pain left greater than right  [id number]  Patrick [name] is a 62 y.o. male coming in with complaint of B elbow pain. Last seen in March 2023 for knee pain. Patient states that he has pain for past month. Played a lot of golf in beginning of October which seemed to be the start of his pain. Now pain is occurring at night. Sleeps on side and he wakes up with hands completely numb. Pain over medial epicondyle, L>R. Also feels is pillow is causing compression on his shoulders due to lack of rise in most of pillows he has tried.       Past Medical History:  Diagnosis Date   Allergy    Arthritis    left knee   Dysrhythmia    " [name]"   Heart murmur    History of hiatal hernia    4 years ago per patient- no surgery needed   History of kidney stones    Hx of adenomatous polyp of colon    Hyperlipidemia    Hypertension    Past Surgical History:  Procedure Laterality Date   APPENDECTOMY     Arthroscopic knee surgeries on both knees     CHOLECYSTECTOMY     COLONOSCOPY     NASAL SEPTOPLASTY W/ TURBINOPLASTY Bilateral 04/13/2019   Procedure: NASAL SEPTOPLASTY WITH BILATERAL TURBINATE REDUCTION;  Surgeon: Newman Pies, MD;  Location: [facility];  Service: ENT;  Laterality: Bilateral;   TOTAL KNEE ARTHROPLASTY Left 01/21/2020   Procedure: LEFT TOTAL KNEE ARTHROPLASTY;  Surgeon: Cammy Copa, MD;  Location: [facility];  Service: Orthopedics;  Laterality: Left;   VASECTOMY     Social History   Socioeconomic History   Marital status: Married    Spouse name: Not on file   Number of children: Not on file   Years of education: Not on file   Highest education level: Not on file   Occupational History   Occupation: Teacher, early years/pre, Network engineer, Semi-retired  Tobacco Use   Smoking status: Former    Types: Cigars, Cigarettes   Smokeless tobacco: Never   Tobacco comments:    one cigar every few years  Vaping Use   Vaping status: Never Used  Substance and Sexual Activity   Alcohol use: Yes    Alcohol/week: 5.0 standard drinks of alcohol    Types: 5 Standard drinks or equivalent per week   Drug use: No   Sexual activity: Yes  Other Topics Concern   Not on file  Social History Narrative   Married.   Education: [employer]   Exercise: Yes   Social Determinants of Corporate investment banker Strain: Not on file  Food Insecurity: Not on file  Transportation Needs: Not on file  Physical Activity: Not on file  Stress: Not on file  Social Connections: Not on file   Allergies  Allergen Reactions   Penicillins Hives    Rash around neck  Has patient had a PCN reaction causing immediate rash, facial/tongue/throat swelling, SOB or lightheadedness with hypotension: Yes Has patient had a PCN reaction causing severe rash involving mucus membranes or skin necrosis: No Has patient had a PCN reaction that  required hospitalization: No Has patient had a PCN reaction occurring within the last 10 years: No If all of the above answers are "NO", then may proceed with Cephalosporin use.   Penicillin G Other (See Comments)   Family History  Adopted: Yes  Problem Relation Age of Onset   Colon cancer Neg Hx    Colon polyps Neg Hx    Esophageal cancer Neg Hx    Rectal cancer Neg Hx    Stomach cancer Neg Hx      Current Outpatient Medications (Cardiovascular):    rosuvastatin (CRESTOR) 20 MG tablet, Take 1 tablet (20 mg total) by mouth daily.   telmisartan (MICARDIS) 80 MG tablet, Take 1 tablet (80 mg total) by mouth daily.  Current Outpatient Medications (Respiratory):    cetirizine (ZYRTEC) 10 MG tablet, Take 10 mg by mouth at bedtime.    fluticasone (FLONASE) 50 MCG/ACT nasal  spray, Place 1 spray into both nostrils daily.  Current Outpatient Medications (Analgesics):    aspirin 81 MG EC tablet, Take 81 mg by mouth daily.   Current Outpatient Medications (Hematological):    vitamin B-12 (CYANOCOBALAMIN) 100 MCG tablet, Take 100 mcg by mouth daily.  Current Outpatient Medications (Other):    cholecalciferol (VITAMIN D3) 25 MCG (1000 UNIT) tablet, Take 1,000 Units by mouth daily.    gabapentin (NEURONTIN) 100 MG capsule, Take 2 capsules (200 mg total) by mouth at bedtime.   Ginger, Zingiber officinalis, (GINGER ROOT) 550 MG CAPS, Take 550 mg by mouth daily.    Krill Oil 300 MG CAPS,    Misc Natural Products (TART CHERRY ADVANCED) CAPS, Take 1 capsule by mouth daily.    Multiple Vitamin (MULTIVITAMIN WITH MINERALS) TABS tablet, Take 1 tablet by mouth daily. Men's Over 50    Turmeric Curcumin 500 MG CAPS, Take 500 mg by mouth daily.    azithromycin (ZITHROMAX) 250 MG tablet, Take 1 tablet (250 mg total) by mouth daily. Take first 2 tablets together, then 1 every day until finished.   Reviewed prior external information including notes and imaging from  primary care provider As well as notes that were available from care everywhere and other healthcare systems.  Past medical history, social, surgical and family history all reviewed in electronic medical record.  No pertanent information unless stated regarding to the chief complaint.   Review of Systems:  No headache, visual changes, nausea, vomiting, diarrhea, constipation, dizziness, abdominal pain, skin rash, fevers, chills, night sweats, weight loss, swollen lymph nodes, body aches, joint swelling, chest pain, shortness of breath, mood changes. POSITIVE muscle aches  Objective  Blood pressure 128/82, pulse 82, height 5\' 10"  (1.778 m), weight 238 lb (108 kg), SpO2 98%.   General: No apparent distress alert and oriented x3 mood and affect normal, dressed appropriately.  HEENT: Pupils equal, extraocular  movements intact  Respiratory: Patient's speak in full sentences and does not appear short of breath  Cardiovascular: No lower extremity edema, non tender, no erythema  Exam shows good range of motion noted.  Positive Tinel's of the ulnar nerve noted though.  Good grip strength noted.  With compression of the ulnar nerve also has some radicular symptoms.  Limited muscular skeletal ultrasound was performed and interpreted by Antoine Primas, M   Limited ultrasound shows subluxation noted of the ulnar nerve noted with some increasing hypoechoic changes that is consistent with dilatation.  With compression of the ultrasound does have increasing discomfort and pain.    Impression and Recommendations:    The  above documentation has been reviewed and is accurate and complete Judi Saa, DO

## 2023-01-10 NOTE — Patient Instructions (Addendum)
Gabapentin 200mg  at night COOP pillow Compression sleeve Xray today See me in 2 months

## 2023-01-16 ENCOUNTER — Encounter: Payer: Self-pay | Admitting: Family Medicine

## 2023-01-18 LAB — HELIX MOLECULAR SCREEN: Genetic Analysis Overall Interpretation: NEGATIVE

## 2023-01-18 LAB — GENECONNECT MOLECULAR SCREEN

## 2023-01-30 ENCOUNTER — Ambulatory Visit: Payer: 59 | Admitting: Orthopedic Surgery

## 2023-01-30 ENCOUNTER — Other Ambulatory Visit (INDEPENDENT_AMBULATORY_CARE_PROVIDER_SITE_OTHER): Payer: Self-pay

## 2023-01-30 ENCOUNTER — Encounter: Payer: Self-pay | Admitting: Orthopedic Surgery

## 2023-01-30 VITALS — Ht 70.0 in | Wt 235.0 lb

## 2023-01-30 DIAGNOSIS — M25561 Pain in right knee: Secondary | ICD-10-CM | POA: Diagnosis not present

## 2023-01-30 DIAGNOSIS — M25551 Pain in right hip: Secondary | ICD-10-CM

## 2023-01-30 DIAGNOSIS — G8929 Other chronic pain: Secondary | ICD-10-CM

## 2023-01-31 NOTE — Progress Notes (Addendum)
Office Visit Note   Patient: Patrick [name]           Date of Birth: [birth year]           MRN: [id number] Visit Date: 01/30/2023 Requested by: Corwin Levins, MD [address] [place],  [place] [address] PCP: Corwin Levins, MD  Subjective: Chief Complaint  Patient presents with   Right Hip - Pain   Right Knee - Pain    HPI: Patrick [name] is a 62 y.o. male who presents to the office reporting multiple orthopedic complaints.  He is having little bit of medial sided left knee pain.  He has a well-functioning left total knee replacement done 3 years ago.  He describes some numbness in the lateral femoral cutaneous nerve distribution on that left-hand side.  No discrete groin pain.  Happens when he stands up.  He also describes some clicking in between the third and fourth metatarsal head which makes his foot hurt.  This has been going on for several months.  Gabapentin helps his symptoms.  He also reports bilateral hand numbness in the ulnar nerve distribution which is currently being treated with physical therapy and Neurontin.  He likes to do a lot of traveling with his wife.  Patient also reports right knee pain.  Having the same level of pain in the right knee that he had prior to left knee replacement.  Reports little bit of pain in the lateral calf region on that right-hand side but nothing radiating into the foot.  Has some stabbing pain intermittently around that posterior buttock region but none in the groin.  He notices this when he is walking.  He takes Advil occasionally but more for the knee than the hip..                ROS: All systems reviewed are negative as they relate to the chief complaint within the history of present illness.  Patient denies fevers or chills.  Assessment & Plan: Visit Diagnoses:  1. Chronic pain of right knee   2. Pain in right hip     Plan: Impression is well-functioning left total knee replacement with no effusion and excellent patella mobility and  range of motion.  Similar medial sided pain which looks to be MCL irritation.  He also has having what appears to be meralgia paresthetica in that left hip region.  Talked about injection is the most aggressive thing to try for that problem.  I think he also has a Morton's neuroma in the foot.  Talked him about wearing wider toebox shoes.  Could consider an injection in that region as well.  Talked about metatarsal bar type arch support which I think would be also helpful and he is going to consider those options.  Regarding the right hip he has no arthritis.  This appears to be coming more from his back.  He also does have right knee arthritis with medial sided pain at the level where he was prior to his left total knee replacement.  After lengthy discussion the patient would like to proceed with right total knee replacement.  Risk and benefits are discussed including not limited to infection or vessel damage incomplete pain relief as well as incomplete restoration of function.  Patient understands risk and benefits and wishes to proceed.  All questions answered.  Follow-Up Instructions: No follow-ups on file.   Orders:  Orders Placed This Encounter  Procedures   XR KNEE 3 VIEW RIGHT  XR HIP UNILAT W OR W/O PELVIS 2-3 VIEWS RIGHT   No orders of the defined types were placed in this encounter.     Procedures: No procedures performed   Clinical Data: No additional findings.  Objective: Vital Signs: Ht 5\' 10"  (1.778 m)   Wt 235 lb (106.6 kg)   BMI 33.72 kg/m   Physical Exam:  Constitutional: Patient appears well-developed HEENT:  Head: Normocephalic Eyes:EOM are normal Neck: Normal range of motion Cardiovascular: Normal rate Pulmonary/chest: Effort normal Neurologic: Patient is alert Skin: Skin is warm Psychiatric: Patient has normal mood and affect  Ortho Exam: Ortho exam demonstrates antalgic gait to the right.  Range of motion is 5-1 20.  Has medial and patellofemoral  tenderness to palpation.  No groin pain with internal/external rotation of either leg.  Left knee has excellent range of motion with no effusion and good patella mobility.  Does have some paresthesias in the lateral femoral cutaneous nerve distribution left versus right no masses in that area or around the pelvis to palpation.  Left foot has some tenderness between third and fourth metatarsal head.  Mulder's click is positive.  Right knee does have mild effusion present.  We discussed injections but the patient wants to hold off on that intervention and instead proceed with knee replacement.  No nerve root tension signs today.  Specialty Comments:  No specialty comments available.  Imaging: XR KNEE 3 VIEW RIGHT  Result Date: 01/31/2023 AP lateral merchant radiographs right knee reviewed.  End-stage bone-on-bone arthritis present in the medial compartment with moderate arthritis in the patellofemoral and lateral compartment.  Slight varus alignment noted.  No acute fracture.  XR HIP UNILAT W OR W/O PELVIS 2-3 VIEWS RIGHT  Result Date: 01/31/2023 AP pelvis lateral radiographs right hip reviewed.  Joint space in the right hip is maintained with no arthritis.  No acute fracture.  No evidence of significant femoral acetabular impingement.  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   Acute bursitis of right shoulder 02/02/2019   Sinusitis 11/06/2018   Penicillin  allergy 11/06/2018   Hip flexor tightness 09/29/2018   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   Hyperglycemia 08/02/2018   HLD (hyperlipidemia) 08/02/2018   Vitamin D deficiency 08/02/2018   Encounter for well adult exam with abnormal findings 07/30/2018   HTN (hypertension) 07/30/2018   Tinnitus aurium, 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   Snoring 05/09/2017   Other allergic rhinitis 02/21/2015   Past Medical History:  Diagnosis Date   Allergy    Arthritis    left knee   Dysrhythmia    " [name]"   Heart murmur    History of hiatal hernia    4 years ago per patient- no surgery needed   History of kidney stones    Hx of adenomatous polyp of colon    Hyperlipidemia    Hypertension     Family History  Adopted: Yes  Problem Relation Age  of Onset   Colon cancer Neg Hx    Colon polyps Neg Hx    Esophageal cancer Neg Hx    Rectal cancer Neg Hx    Stomach cancer Neg Hx     Past Surgical History:  Procedure Laterality Date   APPENDECTOMY     Arthroscopic knee surgeries on both knees     CHOLECYSTECTOMY     COLONOSCOPY     NASAL SEPTOPLASTY W/ TURBINOPLASTY Bilateral 04/13/2019   Procedure: NASAL SEPTOPLASTY WITH BILATERAL TURBINATE REDUCTION;  Surgeon: Newman Pies, MD;  Location: [facility];  Service: ENT;  Laterality: Bilateral;   TOTAL KNEE ARTHROPLASTY Left 01/21/2020   Procedure: LEFT TOTAL KNEE ARTHROPLASTY;  Surgeon: Cammy Copa, MD;  Location: [facility];  Service: Orthopedics;  Laterality: Left;   VASECTOMY     Social History   Occupational History   Occupation: Teacher, early years/pre, Network engineer, Semi-retired  Tobacco Use   Smoking status: Former    Types: Cigars, Cigarettes   Smokeless tobacco: Never   Tobacco comments:    one cigar every few years  Vaping Use   Vaping status: Never Used  Substance and Sexual Activity   Alcohol use: Yes    Alcohol/week: 5.0 standard drinks of alcohol    Types: 5 Standard drinks or equivalent per  week   Drug use: No   Sexual activity: Yes

## 2023-03-12 NOTE — Progress Notes (Signed)
 [name] [address] [place] [address] Phone: [phone] Subjective:   LILLETTE Patrick [name], am serving as a scribe for Dr. Arthea Claudene.  I'm seeing this patient by the request  of:  Norleen Lynwood ORN, MD  CC: bilateral elbow   YEP:Dlagzrupcz  01/10/2023 Ulnar neuropathy at the elbow.  Discussed compression, gabapentin , avoiding certain activities and compression in the area.  Discussed icing regimen and home exercises.  Discussed icing regimen and home exercises otherwise.  Patient will follow-up again in 6 to 8 weeks to see how patient responds.      Update 03/14/2023 Patrick [name] is a 63 y.o. male coming in with complaint of B elbow. Scheduled for TKR on 04/08/2023. Patient states over all a little bit better especially with the numbness. Has noticed he started getting zingers in his arm and hand on the right side when cutting or putting pressure. Full flexion and pain in joint. Numbness has been better but is now having pain when he wakes up. When sitting on the couch playing on his phone he has noticed numbness.       Past Medical History:  Diagnosis Date   Allergy     Arthritis    left knee   Dysrhythmia     [name]   Heart murmur    History of hiatal hernia    4 years ago per patient- no surgery needed   History of kidney stones    Hx of adenomatous polyp of colon    Hyperlipidemia    Hypertension    Past Surgical History:  Procedure Laterality Date   APPENDECTOMY     Arthroscopic knee surgeries on both knees     CHOLECYSTECTOMY     COLONOSCOPY     NASAL SEPTOPLASTY W/ TURBINOPLASTY Bilateral 04/13/2019   Procedure: NASAL SEPTOPLASTY WITH BILATERAL TURBINATE REDUCTION;  Surgeon: Karis Clunes, MD;  Location: [facility];  Service: ENT;  Laterality: Bilateral;   TOTAL KNEE ARTHROPLASTY Left 01/21/2020   Procedure: LEFT TOTAL KNEE ARTHROPLASTY;  Surgeon: Addie Cordella Hamilton, MD;   Location: [facility];  Service: Orthopedics;  Laterality: Left;   VASECTOMY     Social History   Socioeconomic History   Marital status: Married    Spouse name: Not on file   Number of children: Not on file   Years of education: Not on file   Highest education level: Not on file  Occupational History   Occupation: Teacher, Early Years/pre, Network Engineer, Semi-retired  Tobacco Use   Smoking status: Former    Types: Cigars, Cigarettes   Smokeless tobacco: Never   Tobacco comments:    one cigar every few years  Vaping Use   Vaping status: Never Used  Substance and Sexual Activity   Alcohol use: Yes    Alcohol/week: 5.0 standard drinks of alcohol    Types: 5 Standard drinks or equivalent per week   Drug use: No   Sexual activity: Yes  Other Topics Concern   Not on file  Social History Narrative   Married.   Education: [employer]   Exercise: Yes   Social Drivers of Corporate Investment Banker Strain: Not on file  Food Insecurity: Not on file  Transportation Needs: Not on file  Physical Activity: Not on file  Stress: Not on file  Social Connections: Not on file   Allergies  Allergen Reactions   Penicillins Hives    Rash around neck  Has patient had a PCN reaction causing immediate rash, facial/tongue/throat swelling, SOB or lightheadedness with hypotension: Yes Has patient had a PCN reaction causing severe rash involving mucus membranes or skin necrosis: No Has patient had a PCN reaction that required hospitalization: No Has patient had a PCN reaction occurring within the last 10 years: No If all of the above answers are NO, then may proceed with Cephalosporin use.   Penicillin G Other (See Comments)   Family History  Adopted: Yes  Problem Relation Age of Onset   Colon cancer Neg Hx    Colon polyps Neg Hx    Esophageal cancer Neg Hx    Rectal cancer Neg Hx    Stomach cancer Neg Hx      Current Outpatient Medications (Cardiovascular):    rosuvastatin  (CRESTOR ) 20 MG tablet, Take 1  tablet (20 mg total) by mouth daily.   telmisartan  (MICARDIS ) 80 MG tablet, Take 1 tablet (80 mg total) by mouth daily.  Current Outpatient Medications (Respiratory):    cetirizine (ZYRTEC) 10 MG tablet, Take 10 mg by mouth at bedtime.    fluticasone  (FLONASE ) 50 MCG/ACT nasal spray, Place 1 spray into both nostrils daily.  Current Outpatient Medications (Analgesics):    aspirin  81 MG EC tablet, Take 81 mg by mouth daily.   Current Outpatient Medications (Hematological):    vitamin B-12 (CYANOCOBALAMIN ) 100 MCG tablet, Take 100 mcg by mouth daily.  Current Outpatient Medications (Other):    cholecalciferol (VITAMIN D3) 25 MCG (1000 UNIT) tablet, Take 1,000 Units by mouth daily.    gabapentin  (NEURONTIN ) 100 MG capsule, Take 2 capsules (200 mg total) by mouth at bedtime.   Ginger, Zingiber officinalis, (GINGER ROOT) 550 MG CAPS, Take 550 mg by mouth daily.    Krill Oil 300 MG CAPS,    Misc Natural Products (TART CHERRY ADVANCED) CAPS, Take 1 capsule by mouth daily.    Multiple Vitamin (MULTIVITAMIN WITH MINERALS) TABS tablet, Take 1 tablet by mouth daily. Men's Over 50    Turmeric Curcumin 500 MG CAPS, Take 500 mg by mouth daily.    Reviewed prior external information including notes and imaging from  primary care provider As well as notes that were available from care everywhere and other healthcare systems.  Past medical history, social, surgical and family history all reviewed in electronic medical record.  No pertanent information unless stated regarding to the chief complaint.   Review of Systems:  No headache, visual changes, nausea, vomiting, diarrhea, constipation, dizziness, abdominal pain, skin rash, fevers, chills, night sweats, weight loss, swollen lymph nodes, body aches, joint swelling, chest pain, shortness of breath, mood changes. POSITIVE muscle aches  Objective  Blood pressure 132/82, pulse 72, height 5' 10 (1.778 m), SpO2 96%.   General: No apparent distress  alert and oriented x3 mood and affect normal, dressed appropriately.  HEENT: Pupils equal, extraocular movements intact  Respiratory: Patient's speak in full sentences and does not appear short of breath  Cardiovascular: No lower extremity edema, non tender, no erythema  Bilateral elbows do have some tenderness in the ulnar aspect.  Positive Tinel's on the left side that is very severe.  Good grip strength.  Neck exam improvement in range of motion.  Negative Spurling's.  Procedure: Real-time Ultrasound Guided Injection of left ulnar nerve sheath Device: GE Logiq Q7 Ultrasound guided injection is preferred based studies that show increased duration, increased effect, greater accuracy, decreased procedural pain, increased response rate, and decreased cost with ultrasound guided versus blind injection.  Verbal informed consent obtained.  Time-out conducted.  Noted no overlying erythema, induration, or other signs of local infection.  Skin prepped in a sterile fashion.  Local anesthesia: Topical Ethyl chloride.  With sterile technique and under real time ultrasound guidance: With a 25-gauge half inch needle injected with 0.5 cc of 0.5% Marcaine  and 0.5 cc of Kenalog  40 mg/mL. Completed without difficulty  Pain immediately resolved suggesting accurate placement of the medication.  Advised to call if fevers/chills, erythema, induration, drainage, or persistent bleeding.  Impression: Technically successful ultrasound guided injection.    Impression and Recommendations:    The above documentation has been reviewed and is accurate and complete Adria Costley M Amariz Flamenco, DO

## 2023-03-14 ENCOUNTER — Encounter: Payer: Self-pay | Admitting: Family Medicine

## 2023-03-14 ENCOUNTER — Other Ambulatory Visit: Payer: Self-pay

## 2023-03-14 ENCOUNTER — Ambulatory Visit (INDEPENDENT_AMBULATORY_CARE_PROVIDER_SITE_OTHER): Payer: 59 | Admitting: Family Medicine

## 2023-03-14 VITALS — BP 132/82 | HR 72 | Ht 70.0 in

## 2023-03-14 DIAGNOSIS — M25522 Pain in left elbow: Secondary | ICD-10-CM

## 2023-03-14 DIAGNOSIS — M25521 Pain in right elbow: Secondary | ICD-10-CM | POA: Diagnosis not present

## 2023-03-14 DIAGNOSIS — G5622 Lesion of ulnar nerve, left upper limb: Secondary | ICD-10-CM | POA: Diagnosis not present

## 2023-03-14 NOTE — Assessment & Plan Note (Signed)
 Severe noted on the left side.  Given injection today.  No significant bruising noted after the injection.  We discussed with patient to seek medical attention if they notice any significant findings.  Discussed avoiding certain movements if possible.  Discussed continuing the compression.  Follow-up again in 6 to 8 weeks after patient has his knee replacement.  Differential includes cervical radiculopathy but I do not think that it is contributing as much.  Do believe though that patient does have improvement with the gabapentin .

## 2023-03-14 NOTE — Patient Instructions (Addendum)
 Good to see you  Injection in left elbow today  Follow up 6 weeks after the knee replacement

## 2023-04-01 NOTE — Pre-Procedure Instructions (Signed)
Surgical Instructions   Your procedure is scheduled on Monday, February 3rd. Report to [facility] Main Entrance "A" at 12:30 P.M., then check in with the Admitting office. Any questions or running late day of surgery: call [phone]  Questions prior to your surgery date: call [phone], Monday-Friday, 8am-4pm. If you experience any cold or flu symptoms such as cough, fever, chills, shortness of breath, etc. between now and your scheduled surgery, please notify us at the above number.     Remember:  Do not eat after midnight the night before your surgery  You may drink clear liquids until 11:30 AM the morning of your surgery.   Clear liquids allowed are: Water, Non-Citrus Juices (without pulp), Carbonated Beverages, Clear Tea (no milk, honey, etc.), Black Coffee Only (NO MILK, CREAM OR POWDERED CREAMER of any kind), and Gatorade.    Take these medicines the morning of surgery with A SIP OF WATER  fluticasone (FLONASE)  rosuvastatin (CRESTOR)    Follow your surgeon's instructions on when to stop Aspirin.  If no instructions were given by your surgeon then you will need to call the office to get those instructions.     One week prior to surgery, STOP taking any Aleve, Naproxen, Ibuprofen, Motrin, Advil, Goody's, BC's, all herbal medications, fish oil, and non-prescription vitamins.                     Do NOT Smoke (Tobacco/Vaping) for 24 hours prior to your procedure.  If you use a CPAP at night, you may bring your mask/headgear for your overnight stay.   You will be asked to remove any contacts, glasses, piercing's, hearing aid's, dentures/partials prior to surgery. Please bring cases for these items if needed.    Patients discharged the day of surgery will not be allowed to drive home, and someone needs to stay with them for 24 hours.  SURGICAL WAITING ROOM VISITATION Patients may have no more than 2 support people in the waiting area - these visitors may rotate.   Pre-op  nurse will coordinate an appropriate time for 1 ADULT support person, who may not rotate, to accompany patient in pre-op.  Children under the age of 69 must have an adult with them who is not the patient and must remain in the main waiting area with an adult.  If the patient needs to stay at the hospital during part of their recovery, the visitor guidelines for inpatient rooms apply.  Please refer to the [facility] website for the visitor guidelines for any additional information.   If you received a COVID test during your pre-op visit  it is requested that you wear a mask when out in public, stay away from anyone that may not be feeling well and notify your surgeon if you develop symptoms. If you have been in contact with anyone that has tested positive in the last 10 days please notify you surgeon.      Pre-operative 5 CHG Bathing Instructions   You can play a key role in reducing the risk of infection after surgery. Your skin needs to be as free of germs as possible. You can reduce the number of germs on your skin by washing with CHG (chlorhexidine gluconate) soap before surgery. CHG is an antiseptic soap that kills germs and continues to kill germs even after washing.   DO NOT use if you have an allergy to chlorhexidine/CHG or antibacterial soaps. If your skin becomes reddened or irritated, stop using the CHG  and notify one of our RNs at [phone].   Please shower with the CHG soap starting 4 days before surgery using the following schedule:     Please keep in mind the following:  DO NOT shave, including legs and underarms, starting the day of your first shower.   You may shave your face at any point before/day of surgery.  Place clean sheets on your bed the day you start using CHG soap. Use a clean washcloth (not used since being washed) for each shower. DO NOT sleep with pets once you start using the CHG.   CHG Shower Instructions:  Wash your face and private area with normal  soap. If you choose to wash your hair, wash first with your normal shampoo.  After you use shampoo/soap, rinse your hair and body thoroughly to remove shampoo/soap residue.  Turn the water OFF and apply about 3 tablespoons (45 ml) of CHG soap to a CLEAN washcloth.  Apply CHG soap ONLY FROM YOUR NECK DOWN TO YOUR TOES (washing for 3-5 minutes)  DO NOT use CHG soap on face, private areas, open wounds, or sores.  Pay special attention to the area where your surgery is being performed.  If you are having back surgery, having someone wash your back for you may be helpful. Wait 2 minutes after CHG soap is applied, then you may rinse off the CHG soap.  Pat dry with a clean towel  Put on clean clothes/pajamas   If you choose to wear lotion, please use ONLY the CHG-compatible lotions that are listed below.  Additional instructions for the day of surgery: DO NOT APPLY any lotions, deodorants, cologne, or perfumes.   Do not bring valuables to the hospital. [facility] is not responsible for any belongings/valuables. Do not wear nail polish, gel polish, artificial nails, or any other type of covering on natural nails (fingers and toes) Do not wear jewelry or makeup Put on clean/comfortable clothes.  Please brush your teeth.  Ask your nurse before applying any prescription medications to the skin.     CHG Compatible Lotions   Aveeno Moisturizing lotion  Cetaphil Moisturizing Cream  Cetaphil Moisturizing Lotion  Clairol Herbal Essence Moisturizing Lotion, Dry Skin  Clairol Herbal Essence Moisturizing Lotion, Extra Dry Skin  Clairol Herbal Essence Moisturizing Lotion, Normal Skin  Curel Age Defying Therapeutic Moisturizing Lotion with Alpha Hydroxy  Curel Extreme Care Body Lotion  Curel Soothing Hands Moisturizing Hand Lotion  Curel Therapeutic Moisturizing Cream, Fragrance-Free  Curel Therapeutic Moisturizing Lotion, Fragrance-Free  Curel Therapeutic Moisturizing Lotion, Original Formula   Eucerin Daily Replenishing Lotion  Eucerin Dry Skin Therapy Plus Alpha Hydroxy Crme  Eucerin Dry Skin Therapy Plus Alpha Hydroxy Lotion  Eucerin Original Crme  Eucerin Original Lotion  Eucerin Plus Crme Eucerin Plus Lotion  Eucerin TriLipid Replenishing Lotion  Keri Anti-Bacterial Hand Lotion  Keri Deep Conditioning Original Lotion Dry Skin Formula Softly Scented  Keri Deep Conditioning Original Lotion, Fragrance Free Sensitive Skin Formula  Keri Lotion Fast Absorbing Fragrance Free Sensitive Skin Formula  Keri Lotion Fast Absorbing Softly Scented Dry Skin Formula  Keri Original Lotion  Keri Skin Renewal Lotion Keri Silky Smooth Lotion  Keri Silky Smooth Sensitive Skin Lotion  Nivea Body Creamy Conditioning Oil  Nivea Body Extra Enriched Teacher, adult education Moisturizing Lotion Nivea Crme  Nivea Skin Firming Lotion  NutraDerm 30 Skin Lotion  NutraDerm Skin Lotion  NutraDerm Therapeutic Skin Cream  NutraDerm Therapeutic Skin Lotion  ProShield  Protective Hand Cream  Provon moisturizing lotion  Please read over the following fact sheets that you were given.

## 2023-04-02 ENCOUNTER — Other Ambulatory Visit: Payer: Self-pay

## 2023-04-02 ENCOUNTER — Encounter (HOSPITAL_COMMUNITY): Payer: Self-pay

## 2023-04-02 ENCOUNTER — Other Ambulatory Visit: Payer: Self-pay | Admitting: Family Medicine

## 2023-04-02 ENCOUNTER — Encounter (HOSPITAL_COMMUNITY)
Admission: RE | Admit: 2023-04-02 | Discharge: 2023-04-02 | Disposition: A | Discharge status: Home / Self Care | Payer: 59 | Source: Ambulatory Visit | Attending: Orthopedic Surgery | Admitting: Orthopedic Surgery

## 2023-04-02 ENCOUNTER — Encounter: Payer: Self-pay | Admitting: Orthopedic Surgery

## 2023-04-02 VITALS — BP 137/83 | HR 72 | Temp 98.0°F | Resp 18 | Ht 70.0 in | Wt 241.4 lb

## 2023-04-02 DIAGNOSIS — Z01818 Encounter for other preprocedural examination: Secondary | ICD-10-CM | POA: Insufficient documentation

## 2023-04-02 LAB — BASIC METABOLIC PANEL
Anion gap: 11 (ref 5–15)
BUN: 18 mg/dL (ref 8–23)
CO2: 27 mmol/L (ref 22–32)
Calcium: 9.2 mg/dL (ref 8.9–10.3)
Chloride: 103 mmol/L (ref 98–111)
Creatinine, Ser: 1.04 mg/dL (ref 0.61–1.24)
GFR, Estimated: >60 mL/min (ref >60)
Glucose, Bld: 100 mg/dL — ABNORMAL HIGH (ref 70–99)
Potassium: 4.3 mmol/L (ref 3.5–5.1)
Sodium: 141 mmol/L (ref 135–145)

## 2023-04-02 LAB — URINALYSIS, W/ REFLEX TO CULTURE (INFECTION SUSPECTED)
Bacteria, UA: NONE SEEN
Bilirubin Urine: NEGATIVE
Glucose, UA: NEGATIVE mg/dL
Hgb urine dipstick: NEGATIVE
Ketones, ur: NEGATIVE mg/dL
Leukocytes,Ua: NEGATIVE
Nitrite: NEGATIVE
Protein, ur: NEGATIVE mg/dL
Specific Gravity, Urine: 1.015 (ref 1.005–1.030)
pH: 6 (ref 5.0–8.0)

## 2023-04-02 LAB — CBC
HCT: 45.3 % (ref 39.0–52.0)
Hemoglobin: 15.1 g/dL (ref 13.0–17.0)
MCH: 30.4 pg (ref 26.0–34.0)
MCHC: 33.3 g/dL (ref 30.0–36.0)
MCV: 91.3 fL (ref 80.0–100.0)
Platelets: 218 10*3/uL (ref 150–400)
RBC: 4.96 MIL/uL (ref 4.22–5.81)
RDW: 11.9 % (ref 11.5–15.5)
WBC: 5.8 10*3/uL (ref 4.0–10.5)
nRBC: 0 % (ref 0.0–0.2)

## 2023-04-02 LAB — SURGICAL PCR SCREEN
MRSA, PCR: NEGATIVE
Staphylococcus aureus: POSITIVE — AB

## 2023-04-02 MED ORDER — GABAPENTIN 100 MG PO CAPS: 200.0000 mg | ORAL_CAPSULE | Freq: Every day | ORAL | 0 refills | Status: DC

## 2023-04-02 NOTE — Progress Notes (Signed)
PCP - Corwin Levins, MD  Cardiologist - Donato Schultz, MD   PPM/ICD - denies Device Orders - n/a Rep Notified - n/a  Chest x-ray -  EKG - 04-02-23 Stress Test -  ECHO - 15+ years ago Cardiac Cath -   Sleep Study - denies CPAP - n/a  DM - denies  Blood Thinner Instructions: denies Aspirin Instructions: Asa instructed patient to call Dr. Diamantina [place] office for further instructions  ERAS Protcol - clear liquids until 11:30 PRE-SURGERY Ensure   COVID TEST- n/a   Anesthesia review: no  Patient denies shortness of breath, fever, cough and chest pain at PAT appointment   All instructions explained to the patient, with a verbal understanding of the material. Patient agrees to go over the instructions while at home for a better understanding. Patient also instructed to self quarantine after being tested for COVID-19. The opportunity to ask questions was provided.

## 2023-04-05 MED ORDER — TRANEXAMIC ACID 1000 MG/10ML IV SOLN
2000.0000 mg | INTRAVENOUS | Status: DC
  Filled 2023-04-05: qty 20

## 2023-04-08 ENCOUNTER — Other Ambulatory Visit: Payer: Self-pay

## 2023-04-08 ENCOUNTER — Observation Stay (HOSPITAL_COMMUNITY): Payer: Self-pay | Admitting: Anesthesiology

## 2023-04-08 ENCOUNTER — Encounter (HOSPITAL_COMMUNITY): Payer: Self-pay | Admitting: Orthopedic Surgery

## 2023-04-08 ENCOUNTER — Observation Stay (HOSPITAL_COMMUNITY)
Admission: RE | Admit: 2023-04-08 | Discharge: 2023-04-09 | Disposition: A | Discharge status: Home / Self Care | Payer: 59 | Attending: Orthopedic Surgery | Admitting: Orthopedic Surgery

## 2023-04-08 ENCOUNTER — Observation Stay (HOSPITAL_BASED_OUTPATIENT_CLINIC_OR_DEPARTMENT_OTHER): Payer: Self-pay | Admitting: Anesthesiology

## 2023-04-08 ENCOUNTER — Encounter (HOSPITAL_COMMUNITY)
Admission: RE | Disposition: A | Discharge status: Home / Self Care | Payer: Self-pay | Source: Home / Self Care | Attending: Orthopedic Surgery

## 2023-04-08 DIAGNOSIS — Z96651 Presence of right artificial knee joint: Secondary | ICD-10-CM

## 2023-04-08 DIAGNOSIS — J45909 Unspecified asthma, uncomplicated: Secondary | ICD-10-CM | POA: Insufficient documentation

## 2023-04-08 DIAGNOSIS — M1711 Unilateral primary osteoarthritis, right knee: Secondary | ICD-10-CM

## 2023-04-08 DIAGNOSIS — I10 Essential (primary) hypertension: Secondary | ICD-10-CM | POA: Insufficient documentation

## 2023-04-08 DIAGNOSIS — G8918 Other acute postprocedural pain: Secondary | ICD-10-CM | POA: Diagnosis not present

## 2023-04-08 DIAGNOSIS — Z96652 Presence of left artificial knee joint: Secondary | ICD-10-CM | POA: Insufficient documentation

## 2023-04-08 DIAGNOSIS — Z87891 Personal history of nicotine dependence: Secondary | ICD-10-CM | POA: Diagnosis not present

## 2023-04-08 DIAGNOSIS — Z01818 Encounter for other preprocedural examination: Principal | ICD-10-CM

## 2023-04-08 HISTORY — PX: TOTAL KNEE ARTHROPLASTY: SHX125

## 2023-04-08 SURGERY — ARTHROPLASTY, KNEE, TOTAL
Anesthesia: Monitor Anesthesia Care | Site: Knee | Laterality: Right

## 2023-04-08 MED ORDER — CLONIDINE HCL (ANALGESIA) 100 MCG/ML EP SOLN
EPIDURAL | Status: DC | PRN
  Administered 2023-04-08: 100 ug

## 2023-04-08 MED ORDER — FENTANYL CITRATE (PF) 100 MCG/2ML IJ SOLN
INTRAMUSCULAR | Status: AC
  Administered 2023-04-08: 100 ug via INTRAVENOUS
  Filled 2023-04-08: qty 2

## 2023-04-08 MED ORDER — ACETAMINOPHEN 325 MG PO TABS
325.0000 mg | ORAL_TABLET | Freq: Four times a day (QID) | ORAL | Status: DC | PRN
  Administered 2023-04-09: 650 mg via ORAL
  Filled 2023-04-08: qty 2

## 2023-04-08 MED ORDER — OXYCODONE HCL 5 MG PO TABS
5.0000 mg | ORAL_TABLET | Freq: Once | ORAL | Status: AC | PRN
  Administered 2023-04-08: 5 mg via ORAL

## 2023-04-08 MED ORDER — CEFAZOLIN SODIUM-DEXTROSE 2-4 GM/100ML-% IV SOLN: 2.0000 g | INTRAVENOUS | Status: DC

## 2023-04-08 MED ORDER — CHLORHEXIDINE GLUCONATE 0.12 % MT SOLN
OROMUCOSAL | Status: AC
  Administered 2023-04-08: 15 mL via OROMUCOSAL
  Filled 2023-04-08: qty 15

## 2023-04-08 MED ORDER — OXYCODONE HCL 5 MG/5ML PO SOLN: 5.0000 mg | Freq: Once | ORAL | Status: AC | PRN

## 2023-04-08 MED ORDER — METOCLOPRAMIDE HCL 5 MG PO TABS: 5.0000 mg | ORAL_TABLET | Freq: Three times a day (TID) | ORAL | Status: DC | PRN

## 2023-04-08 MED ORDER — PROPOFOL 500 MG/50ML IV EMUL
INTRAVENOUS | Status: DC | PRN
  Administered 2023-04-08: 50 ug/kg/min via INTRAVENOUS

## 2023-04-08 MED ORDER — SODIUM CHLORIDE 0.9 % IR SOLN
Status: DC | PRN
  Administered 2023-04-08: 3000 mL

## 2023-04-08 MED ORDER — PHENYLEPHRINE HCL-NACL 20-0.9 MG/250ML-% IV SOLN
INTRAVENOUS | Status: DC | PRN
  Administered 2023-04-08: 30 ug/min via INTRAVENOUS

## 2023-04-08 MED ORDER — POVIDONE-IODINE 10 % EX SWAB: 2.0000 | Freq: Once | CUTANEOUS | Status: DC

## 2023-04-08 MED ORDER — METHOCARBAMOL 1000 MG/10ML IJ SOLN: 500.0000 mg | Freq: Four times a day (QID) | INTRAMUSCULAR | Status: DC | PRN

## 2023-04-08 MED ORDER — PROPOFOL 10 MG/ML IV BOLUS
INTRAVENOUS | Status: AC
  Filled 2023-04-08: qty 20

## 2023-04-08 MED ORDER — BUPIVACAINE LIPOSOME 1.3 % IJ SUSP
INTRAMUSCULAR | Status: AC
  Filled 2023-04-08: qty 20

## 2023-04-08 MED ORDER — ONDANSETRON HCL 4 MG/2ML IJ SOLN
INTRAMUSCULAR | Status: AC
  Filled 2023-04-08: qty 2

## 2023-04-08 MED ORDER — MIDAZOLAM HCL 2 MG/2ML IJ SOLN
2.0000 mg | Freq: Once | INTRAMUSCULAR | Status: AC
Start: 2023-04-08 — End: 2023-04-08

## 2023-04-08 MED ORDER — TRANEXAMIC ACID-NACL 1000-0.7 MG/100ML-% IV SOLN
INTRAVENOUS | Status: AC
  Filled 2023-04-08: qty 100

## 2023-04-08 MED ORDER — LACTATED RINGERS IV SOLN: Freq: Once | INTRAVENOUS | Status: AC

## 2023-04-08 MED ORDER — LACTATED RINGERS IV SOLN: INTRAVENOUS | Status: DC | PRN

## 2023-04-08 MED ORDER — ONDANSETRON HCL 4 MG/2ML IJ SOLN: INTRAMUSCULAR | Status: DC | PRN

## 2023-04-08 MED ORDER — TRANEXAMIC ACID-NACL 1000-0.7 MG/100ML-% IV SOLN
1000.0000 mg | INTRAVENOUS | Status: AC
  Administered 2023-04-08: 1000 mg via INTRAVENOUS

## 2023-04-08 MED ORDER — BUPIVACAINE HCL (PF) 0.25 % IJ SOLN
INTRAMUSCULAR | Status: AC
  Filled 2023-04-08: qty 30

## 2023-04-08 MED ORDER — BUPIVACAINE IN DEXTROSE 0.75-8.25 % IT SOLN
INTRATHECAL | Status: DC | PRN
  Administered 2023-04-08: 1.8 mL via INTRATHECAL

## 2023-04-08 MED ORDER — METHOCARBAMOL 500 MG PO TABS
500.0000 mg | ORAL_TABLET | Freq: Four times a day (QID) | ORAL | Status: DC | PRN
  Administered 2023-04-08: 500 mg via ORAL
  Filled 2023-04-08: qty 1

## 2023-04-08 MED ORDER — VANCOMYCIN HCL 1000 MG IV SOLR
INTRAVENOUS | Status: AC
  Filled 2023-04-08: qty 20

## 2023-04-08 MED ORDER — CEFAZOLIN SODIUM-DEXTROSE 2-4 GM/100ML-% IV SOLN
2.0000 g | Freq: Three times a day (TID) | INTRAVENOUS | Status: AC
  Administered 2023-04-08 (×2): 2 g via INTRAVENOUS
  Filled 2023-04-08: qty 100

## 2023-04-08 MED ORDER — ACETAMINOPHEN 500 MG PO TABS
1000.0000 mg | ORAL_TABLET | Freq: Four times a day (QID) | ORAL | Status: AC
  Administered 2023-04-08 – 2023-04-09 (×4): 1000 mg via ORAL
  Filled 2023-04-08 (×4): qty 2

## 2023-04-08 MED ORDER — ONDANSETRON HCL 4 MG/2ML IJ SOLN: 4.0000 mg | Freq: Once | INTRAMUSCULAR | Status: DC | PRN

## 2023-04-08 MED ORDER — DOCUSATE SODIUM 100 MG PO CAPS
100.0000 mg | ORAL_CAPSULE | Freq: Two times a day (BID) | ORAL | Status: DC
  Administered 2023-04-08 – 2023-04-09 (×2): 100 mg via ORAL
  Filled 2023-04-08 (×2): qty 1

## 2023-04-08 MED ORDER — METOCLOPRAMIDE HCL 5 MG/ML IJ SOLN: 5.0000 mg | Freq: Three times a day (TID) | INTRAMUSCULAR | Status: DC | PRN

## 2023-04-08 MED ORDER — DEXAMETHASONE SODIUM PHOSPHATE 10 MG/ML IJ SOLN
INTRAMUSCULAR | Status: DC | PRN
  Administered 2023-04-08: 10 mg via INTRAVENOUS

## 2023-04-08 MED ORDER — PHENOL 1.4 % MT LIQD: 1.0000 | OROMUCOSAL | Status: DC | PRN

## 2023-04-08 MED ORDER — IRRISEPT - 450ML BOTTLE WITH 0.05% CHG IN STERILE WATER, USP 99.95% OPTIME
TOPICAL | Status: DC | PRN
  Administered 2023-04-08: 450 mL via TOPICAL

## 2023-04-08 MED ORDER — FENTANYL CITRATE (PF) 100 MCG/2ML IJ SOLN: 25.0000 ug | INTRAMUSCULAR | Status: DC | PRN

## 2023-04-08 MED ORDER — TRANEXAMIC ACID 1000 MG/10ML IV SOLN
INTRAVENOUS | Status: DC | PRN
  Administered 2023-04-08: 2000 mg via TOPICAL

## 2023-04-08 MED ORDER — MORPHINE SULFATE 4 MG/ML IJ SOLN
INTRAMUSCULAR | Status: DC | PRN
  Administered 2023-04-08: 13 mL via INTRAMUSCULAR

## 2023-04-08 MED ORDER — ACETAMINOPHEN 160 MG/5ML PO SOLN: 325.0000 mg | ORAL | Status: DC | PRN

## 2023-04-08 MED ORDER — OXYCODONE HCL 5 MG PO TABS
5.0000 mg | ORAL_TABLET | ORAL | Status: DC | PRN
Start: 2023-04-08 — End: 2023-04-09
  Filled 2023-04-08: qty 1

## 2023-04-08 MED ORDER — FENTANYL CITRATE (PF) 100 MCG/2ML IJ SOLN: 100.0000 ug | Freq: Once | INTRAMUSCULAR | Status: AC

## 2023-04-08 MED ORDER — POVIDONE-IODINE 7.5 % EX SOLN
Freq: Once | CUTANEOUS | Status: DC
  Filled 2023-04-08: qty 118

## 2023-04-08 MED ORDER — VANCOMYCIN HCL 1000 MG IV SOLR
INTRAVENOUS | Status: DC | PRN
  Administered 2023-04-08: 1000 mg

## 2023-04-08 MED ORDER — DEXAMETHASONE SODIUM PHOSPHATE 10 MG/ML IJ SOLN
INTRAMUSCULAR | Status: AC
  Filled 2023-04-08: qty 1

## 2023-04-08 MED ORDER — CEFAZOLIN SODIUM-DEXTROSE 2-4 GM/100ML-% IV SOLN
INTRAVENOUS | Status: AC
  Filled 2023-04-08: qty 100

## 2023-04-08 MED ORDER — MIDAZOLAM HCL 2 MG/2ML IJ SOLN
INTRAMUSCULAR | Status: AC
  Administered 2023-04-08: 2 mg via INTRAVENOUS
  Filled 2023-04-08: qty 2

## 2023-04-08 MED ORDER — CELECOXIB 100 MG PO CAPS
100.0000 mg | ORAL_CAPSULE | Freq: Two times a day (BID) | ORAL | Status: DC
  Administered 2023-04-08 – 2023-04-09 (×2): 100 mg via ORAL
  Filled 2023-04-08 (×2): qty 1

## 2023-04-08 MED ORDER — MENTHOL 3 MG MT LOZG: 1.0000 | LOZENGE | OROMUCOSAL | Status: DC | PRN

## 2023-04-08 MED ORDER — MORPHINE SULFATE (PF) 4 MG/ML IV SOLN
INTRAVENOUS | Status: AC
  Filled 2023-04-08: qty 2

## 2023-04-08 MED ORDER — CHLORHEXIDINE GLUCONATE 0.12 % MT SOLN: 15.0000 mL | OROMUCOSAL | Status: AC

## 2023-04-08 MED ORDER — ONDANSETRON HCL 4 MG/2ML IJ SOLN
INTRAMUSCULAR | Status: DC | PRN
  Administered 2023-04-08: 4 mg via INTRAVENOUS

## 2023-04-08 MED ORDER — ASPIRIN 81 MG PO CHEW
81.0000 mg | CHEWABLE_TABLET | Freq: Two times a day (BID) | ORAL | Status: DC
  Administered 2023-04-08 – 2023-04-09 (×2): 81 mg via ORAL
  Filled 2023-04-08 (×2): qty 1

## 2023-04-08 MED ORDER — 0.9 % SODIUM CHLORIDE (POUR BTL) OPTIME
TOPICAL | Status: DC | PRN
  Administered 2023-04-08: 4000 mL

## 2023-04-08 MED ORDER — OXYCODONE HCL 5 MG PO TABS
ORAL_TABLET | ORAL | Status: AC
  Administered 2023-04-08: 5 mg via ORAL
  Filled 2023-04-08: qty 1

## 2023-04-08 MED ORDER — ACETAMINOPHEN 325 MG PO TABS: 325.0000 mg | ORAL_TABLET | ORAL | Status: DC | PRN

## 2023-04-08 MED ORDER — ROPIVACAINE HCL 5 MG/ML IJ SOLN
INTRAMUSCULAR | Status: DC | PRN
  Administered 2023-04-08: 20 mL via PERINEURAL

## 2023-04-08 MED ORDER — HYDROMORPHONE HCL 1 MG/ML IJ SOLN: 0.5000 mg | INTRAMUSCULAR | Status: DC | PRN

## 2023-04-08 MED ORDER — ONDANSETRON HCL 4 MG PO TABS: 4.0000 mg | ORAL_TABLET | Freq: Four times a day (QID) | ORAL | Status: DC | PRN

## 2023-04-08 MED ORDER — SODIUM CHLORIDE (PF) 0.9 % IJ SOLN
INTRAMUSCULAR | Status: DC | PRN
  Administered 2023-04-08: 60 mL via INTRAMUSCULAR

## 2023-04-08 MED ORDER — MEPERIDINE HCL 25 MG/ML IJ SOLN: 6.2500 mg | INTRAMUSCULAR | Status: DC | PRN

## 2023-04-08 SURGICAL SUPPLY — 69 items
BAG COUNTER SPONGE SURGICOUNT (BAG) ×1 IMPLANT
BAG DECANTER FOR FLEXI CONT (MISCELLANEOUS) ×1 IMPLANT
BANDAGE ESMARK 6X9 LF (GAUZE/BANDAGES/DRESSINGS) ×1 IMPLANT
BLADE SAG 18X100X1.27 (BLADE) ×1 IMPLANT
BLADE SAW SGTL 13X75X1.27 (BLADE) ×1 IMPLANT
BLADE SAW THK.89X75X18XSGTL (BLADE) ×1 IMPLANT
BNDG COHESIVE 6X5 TAN ST LF (GAUZE/BANDAGES/DRESSINGS) ×1 IMPLANT
BNDG ELASTIC 6X15 VLCR STRL LF (GAUZE/BANDAGES/DRESSINGS) ×1 IMPLANT
BNDG ESMARK 6X9 LF (GAUZE/BANDAGES/DRESSINGS) ×1 IMPLANT
BOWL SMART MIX CTS (DISPOSABLE) IMPLANT
CNTNR URN SCR LID CUP LEK RST (MISCELLANEOUS) ×1 IMPLANT
COVER SURGICAL LIGHT HANDLE (MISCELLANEOUS) ×1 IMPLANT
CUFF TOURN SGL QUICK 42 (TOURNIQUET CUFF) IMPLANT
CUFF TRNQT CYL 34X4.125X (TOURNIQUET CUFF) ×1 IMPLANT
DRAPE INCISE IOBAN 66X45 STRL (DRAPES) IMPLANT
DRAPE SURG ORHT 6 SPLT 77X108 (DRAPES) ×3 IMPLANT
DRAPE U-SHAPE 47X51 STRL (DRAPES) ×1 IMPLANT
DRSG AQUACEL AG ADV 3.5X10 (GAUZE/BANDAGES/DRESSINGS) IMPLANT
DRSG AQUACEL AG ADV 3.5X14 (GAUZE/BANDAGES/DRESSINGS) IMPLANT
DURAPREP 26ML APPLICATOR (WOUND CARE) ×2 IMPLANT
ELECT CAUTERY BLADE 6.4 (BLADE) ×1 IMPLANT
ELECT PENCIL ROCKER SW 15FT (MISCELLANEOUS) IMPLANT
ELECT REM PT RETURN 9FT ADLT (ELECTROSURGICAL) ×1 IMPLANT
ELECTRODE REM PT RTRN 9FT ADLT (ELECTROSURGICAL) ×1 IMPLANT
GAUZE SPONGE 4X4 12PLY STRL (GAUZE/BANDAGES/DRESSINGS) ×1 IMPLANT
GLOVE BIOGEL PI IND STRL 7.0 (GLOVE) ×1 IMPLANT
GLOVE BIOGEL PI IND STRL 8 (GLOVE) ×1 IMPLANT
GLOVE ECLIPSE 7.0 STRL STRAW (GLOVE) ×1 IMPLANT
GLOVE ECLIPSE 8.0 STRL XLNG CF (GLOVE) ×1 IMPLANT
GLOVE SURG ENC MOIS LTX SZ6.5 (GLOVE) ×3 IMPLANT
GOWN STRL REUS W/ TWL LRG LVL3 (GOWN DISPOSABLE) ×3 IMPLANT
HOOD PEEL AWAY T7 (MISCELLANEOUS) ×3 IMPLANT
IMMOBILIZER KNEE 20 (SOFTGOODS) IMPLANT
IMMOBILIZER KNEE 20 THIGH 36 (SOFTGOODS) IMPLANT
IMMOBILIZER KNEE 22 UNIV (SOFTGOODS) IMPLANT
IMMOBILIZER KNEE 24 THIGH 36 (MISCELLANEOUS) IMPLANT
IMMOBILIZER KNEE 24 UNIV (MISCELLANEOUS) IMPLANT
INSERT TRIATH CS X3 11 (Insert) IMPLANT
KIT BASIN OR (CUSTOM PROCEDURE TRAY) ×1 IMPLANT
KIT TURNOVER KIT B (KITS) ×1 IMPLANT
KNEE FEMORAL COMP RT RETAIN (Knees) IMPLANT
KNEE PATELLA ASYMMETRIC 10X35 (Knees) IMPLANT
KNEE TIBIAL COMPONENT SZ5 (Knees) IMPLANT
MANIFOLD NEPTUNE II (INSTRUMENTS) ×1 IMPLANT
NDL 22X1.5 STRL (OR ONLY) (MISCELLANEOUS) ×2 IMPLANT
NDL SPNL 18GX3.5 QUINCKE PK (NEEDLE) ×1 IMPLANT
NEEDLE 22X1.5 STRL (OR ONLY) (MISCELLANEOUS) ×2 IMPLANT
NEEDLE SPNL 18GX3.5 QUINCKE PK (NEEDLE) ×1 IMPLANT
NS IRRIG 1000ML POUR BTL (IV SOLUTION) ×2 IMPLANT
PACK TOTAL JOINT (CUSTOM PROCEDURE TRAY) ×1 IMPLANT
PAD ARMBOARD 7.5X6 YLW CONV (MISCELLANEOUS) ×2 IMPLANT
PAD CAST 4YDX4 CTTN HI CHSV (CAST SUPPLIES) ×1 IMPLANT
PADDING CAST COTTON 6X4 STRL (CAST SUPPLIES) ×1 IMPLANT
SET HNDPC FAN SPRY TIP SCT (DISPOSABLE) ×1 IMPLANT
SPIKE FLUID TRANSFER (MISCELLANEOUS) ×1 IMPLANT
STRIP CLOSURE SKIN 1/2X4 (GAUZE/BANDAGES/DRESSINGS) ×2 IMPLANT
SUCTION TUBE FRAZIER 10FR DISP (SUCTIONS) ×1 IMPLANT
SUT MNCRL AB 3-0 PS2 18 (SUTURE) ×1 IMPLANT
SUT VIC AB 0 CT1 27XBRD ANBCTR (SUTURE) ×3 IMPLANT
SUT VIC AB 1 CT1 36 (SUTURE) ×5 IMPLANT
SUT VIC AB 2-0 CT2 27 (SUTURE) ×4 IMPLANT
SUT VICRYL 0 27 CT2 27 ABS (SUTURE) ×1 IMPLANT
SYR 30ML LL (SYRINGE) ×3 IMPLANT
SYR TB 1ML LUER SLIP (SYRINGE) ×1 IMPLANT
TOWEL GREEN STERILE (TOWEL DISPOSABLE) ×2 IMPLANT
TOWEL GREEN STERILE FF (TOWEL DISPOSABLE) ×2 IMPLANT
TRAY CATH INTERMITTENT SS 16FR (CATHETERS) IMPLANT
WATER STERILE IRR 1000ML POUR (IV SOLUTION) IMPLANT
YANKAUER SUCT BULB TIP NO VENT (SUCTIONS) ×1 IMPLANT

## 2023-04-08 NOTE — Progress Notes (Signed)
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  Patient would like to proceed with right total knee replacement.  Full H&P to follow.

## 2023-04-08 NOTE — H&P (Signed)
TOTAL KNEE ADMISSION H&P  Patient is being admitted for right total knee arthroplasty.  Subjective:  Chief Complaint:right knee pain.  HPI: Patrick [name], 63 y.o. male, has a history of pain and functional disability in the right knee due to arthritis and has failed non-surgical conservative treatments for greater than 12 weeks to includeNSAID's and/or analgesics, corticosteriod injections, flexibility and strengthening excercises, and activity modification.  Onset of symptoms was gradual, starting 5 years ago with gradually worsening course since that time. The patient noted prior procedures on the knee to include  arthroscopy and menisectomy on the right knee(s).  Patient currently rates pain in the right knee(s) at 8 out of 10 with activity. Patient has night pain, worsening of pain with activity and weight bearing, pain that interferes with activities of daily living, pain with passive range of motion, crepitus, and joint swelling.  Patient has evidence of subchondral sclerosis and joint space narrowing by imaging studies. This patient has had  a good result with his left total knee replacement. . There is no active infection.  No personal or family history of DVT or pulmonary embolism.  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   Acute bursitis of right shoulder 02/02/2019   Sinusitis 11/06/2018   Penicillin allergy 11/06/2018   Hip flexor tightness 09/29/2018    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   Hyperglycemia 08/02/2018   HLD (hyperlipidemia) 08/02/2018   Vitamin D deficiency 08/02/2018   Encounter for well adult exam with abnormal findings 07/30/2018   HTN (hypertension) 07/30/2018   Tinnitus aurium, 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   Snoring 05/09/2017   Other allergic rhinitis 02/21/2015   Past Medical History:  Diagnosis Date   Allergy    Arthritis    left knee   Dysrhythmia    " [name]"   Heart murmur    History of hiatal hernia    4 years ago per patient- no surgery needed   History of kidney stones    Hx of adenomatous polyp of colon    Hyperlipidemia    Hypertension     Past Surgical History:  Procedure Laterality Date   APPENDECTOMY     Arthroscopic knee surgeries on both knees     CHOLECYSTECTOMY     COLONOSCOPY     NASAL SEPTOPLASTY W/ TURBINOPLASTY Bilateral 04/13/2019   Procedure: NASAL SEPTOPLASTY WITH BILATERAL TURBINATE REDUCTION;  Surgeon: Newman Pies, MD;  Location: [facility];  Service: ENT;  Laterality: Bilateral;   TOTAL KNEE ARTHROPLASTY Left 01/21/2020   Procedure: LEFT TOTAL KNEE ARTHROPLASTY;  Surgeon: Cammy Copa, MD;  Location: [facility] OR;  Service: Orthopedics;  Laterality: Left;   VASECTOMY      Current Facility-Administered Medications  Medication Dose Route Frequency Provider Last Rate Last Admin   acetaminophen (TYLENOL) tablet 1,000 mg  1,000 mg Oral Q6H Magnant, Charles L, PA-C       [START ON 04/09/2023] acetaminophen (TYLENOL) tablet 325-650 mg  325-650 mg Oral Q6H PRN Magnant, Charles L, PA-C       aspirin chewable tablet 81 mg  81 mg Oral BID Magnant, Charles L, PA-C       ceFAZolin (ANCEF) 2-4 GM/100ML-% IVPB            [START ON 04/09/2023] ceFAZolin (ANCEF) IVPB 2g/100 mL premix  2 g Intravenous On Call to OR Magnant, Charles L, PA-C       ceFAZolin (ANCEF) IVPB 2g/100 mL premix  2 g Intravenous Q8H Magnant, Charles L, PA-C       celecoxib  (CELEBREX) capsule 100 mg  100 mg Oral BID Magnant, Charles L, PA-C       docusate sodium (COLACE) capsule 100 mg  100 mg Oral BID Magnant, Charles L, PA-C       HYDROmorphone (DILAUDID) injection 0.5 mg  0.5 mg Intravenous Q4H PRN Magnant, Charles L, PA-C       menthol-cetylpyridinium (CEPACOL) lozenge 3 mg  1 lozenge Oral PRN Magnant, Charles L, PA-C       Or   phenol (CHLORASEPTIC) mouth spray 1 spray  1 spray Mouth/Throat PRN Magnant, Charles L, PA-C       methocarbamol (ROBAXIN) tablet 500 mg  500 mg Oral Q6H PRN Magnant, Charles L, PA-C       Or   methocarbamol (ROBAXIN) injection 500 mg  500 mg Intravenous Q6H PRN Magnant, Charles L, PA-C       metoCLOPramide (REGLAN) tablet 5-10 mg  5-10 mg Oral Q8H PRN Magnant, Charles L, PA-C       Or   metoCLOPramide (REGLAN) injection 5-10 mg  5-10 mg Intravenous Q8H PRN Magnant, Charles L, PA-C       ondansetron (ZOFRAN) tablet 4 mg  4 mg Oral Q6H PRN Magnant, Charles L, PA-C       oxyCODONE (Oxy IR/ROXICODONE) immediate release tablet 5-10 mg  5-10 mg Oral Q4H PRN Magnant, Charles L, PA-C       povidone-iodine (BETADINE) 7.5 % scrub   Topical Once Magnant, Charles L, PA-C       povidone-iodine 10 % swab 2 Application  2 Application Topical Once Magnant, Charles L, PA-C       povidone-iodine 10 % swab 2 Application  2 Application Topical Once Magnant, Charles L, PA-C       tranexamic acid (CYKLOKAPRON) 1000MG /148mL IVPB            tranexamic acid (CYKLOKAPRON) 2,000 mg in sodium chloride 0.9 % 50 mL Topical Application  2,000 mg Topical To OR August Saucer, Corrie Mckusick, MD       tranexamic acid (CYKLOKAPRON) IVPB 1,000 mg  1,000 mg Intravenous To OR Magnant, Charles L, PA-C       Allergies  Allergen Reactions   Penicillins Hives    Rash around neck      Social History   Tobacco Use   Smoking status: Former    Types: Cigars, Cigarettes   Smokeless tobacco: Never   Tobacco comments:    one cigar every few years  Substance Use Topics   Alcohol  use: Yes    Alcohol/week: 5.0 standard drinks of alcohol    Types: 5 Standard drinks or equivalent per week    Comment: socially    Family History  Adopted: Yes  Problem Relation Age of Onset   Colon cancer Neg Hx    Colon polyps Neg Hx    Esophageal cancer Neg Hx    Rectal cancer Neg Hx  Stomach cancer Neg Hx      Review of Systems  Musculoskeletal:  Positive for arthralgias.  All other systems reviewed and are negative.   Objective:  Physical Exam Vitals reviewed.  HENT:     Head: Normocephalic.     Nose: Nose normal.  Eyes:     Pupils: Pupils are equal, round, and reactive to light.  Cardiovascular:     Rate and Rhythm: Normal rate.     Pulses: Normal pulses.  Pulmonary:     Effort: Pulmonary effort is normal.  Abdominal:     General: Abdomen is flat.  Musculoskeletal:     Cervical back: Normal range of motion.  Skin:    General: Skin is warm.     Capillary Refill: Capillary refill takes less than 2 seconds.  Neurological:     General: No focal deficit present.     Mental Status: He is alert.  Psychiatric:        Mood and Affect: Mood normal.    Ortho exam demonstrates antalgic gait to the right.  Range of motion right knee  is 5-1 20.  Has medial and patellofemoral tenderness to palpation.  No groin pain with internal/external rotation of either leg.  Left knee has excellent range of motion with no effusion and good patella mobility.  Vital signs in last 24 hours:    Labs:   Estimated body mass index is 34.64 kg/m as calculated from the following:   Height as of 04/02/23: 5\' 10"  (1.778 m).   Weight as of 04/02/23: 109.5 kg.   Imaging Review Plain radiographs demonstrate severe degenerative joint disease of the right knee(s). The overall alignment ismild varus. The bone quality appears to be good for age and reported activity level.      Assessment/Plan:  End stage arthritis, right knee   The patient history, physical examination, clinical  judgment of the provider and imaging studies are consistent with end stage degenerative joint disease of the right knee(s) and total knee arthroplasty is deemed medically necessary. The treatment options including medical management, injection therapy arthroscopy and arthroplasty were discussed at length. The risks and benefits of total knee arthroplasty were presented and reviewed. The risks due to aseptic loosening, infection, stiffness, patella tracking problems, thromboembolic complications and other imponderables were discussed. The patient acknowledged the explanation, agreed to proceed with the plan and consent was signed. Patient is being admitted for inpatient treatment for surgery, pain control, PT, OT, prophylactic antibiotics, VTE prophylaxis, progressive ambulation and ADL's and discharge planning. The patient is planning to be discharged home with home health services     Patient's anticipated LOS is less than 2 midnights, meeting these requirements: - Younger than 3 - Lives within 1 hour of care - Has a competent adult at home to recover with post-op recover - NO history of  - Chronic pain requiring opiods  - Diabetes  - Coronary Artery Disease  - Heart failure  - Heart attack  - Stroke  - DVT/VTE  - Cardiac arrhythmia  - Respiratory Failure/COPD  - Renal failure  - Anemia  - Advanced Liver disease

## 2023-04-08 NOTE — Anesthesia Preprocedure Evaluation (Signed)
Anesthesia Evaluation  Patient identified by MRN, date of birth, ID band Patient awake    Reviewed: Allergy & Precautions, NPO status , Patient's Chart, lab work & pertinent test results  History of Anesthesia Complications Negative for: history of anesthetic complications  Airway Mallampati: II  TM Distance: >3 FB Neck ROM: Full    Dental  (+)    Pulmonary neg pulmonary ROS, former smoker   Pulmonary exam normal        Cardiovascular hypertension, Pt. on medications Normal cardiovascular exam+ dysrhythmias      Neuro/Psych negative neurological ROS  negative psych ROS   GI/Hepatic negative GI ROS, Neg liver ROS,,,  Endo/Other  negative endocrine ROS    Renal/GU negative Renal ROS  negative genitourinary   Musculoskeletal  (+) Arthritis , Osteoarthritis,    Abdominal   Peds  Hematology negative hematology ROS (+)   Anesthesia Other Findings   Reproductive/Obstetrics                              Anesthesia Physical Anesthesia Plan  ASA: II  Anesthesia Plan: MAC, Spinal and Regional   Post-op Pain Management: Tylenol PO (pre-op)* and Celebrex PO (pre-op)*   Induction: Intravenous  PONV Risk Score and Plan: 2 and Ondansetron, Dexamethasone, Treatment may vary due to age or medical condition and Midazolam  Airway Management Planned: Nasal Cannula, Natural Airway, Simple Face Mask and Mask  Additional Equipment: None  Intra-op Plan:   Post-operative Plan: Extubation in OR  Informed Consent: I have reviewed the patients History and Physical, chart, labs and discussed the procedure including the risks, benefits and alternatives for the proposed anesthesia with the patient or authorized representative who has indicated his/her understanding and acceptance.     Dental advisory given  Plan Discussed with: Anesthesiologist and CRNA  Anesthesia Plan Comments: (  )          Anesthesia Quick Evaluation

## 2023-04-08 NOTE — Anesthesia Procedure Notes (Signed)
Procedure Name: MAC Date/Time: 04/08/2023 2:29 PM  Performed by: Cy Blamer, CRNAPre-anesthesia Checklist: Emergency Drugs available, Suction available, Patient being monitored and Patient identified Patient Re-evaluated:Patient Re-evaluated prior to induction Oxygen Delivery Method: Simple face mask Placement Confirmation: CO2 detector Dental Injury: Teeth and Oropharynx as per pre-operative assessment

## 2023-04-08 NOTE — Brief Op Note (Signed)
   04/08/2023  5:13 PM  PATIENT:  Patrick [name]  63 y.o. male  PRE-OPERATIVE DIAGNOSIS:  right knee osteoarthritis  POST-OPERATIVE DIAGNOSIS:  right knee osteoarthritis  PROCEDURE:  Procedure(s): RIGHT TOTAL KNEE ARTHROPLASTY  SURGEON:  Surgeon(s): Cammy Copa, MD  ASSISTANT: magnant pa  ANESTHESIA:   spinal  EBL: 25 ml    Total I/O In: 1000 [I.V.:1000] Out: 1020 [Urine:1000; Blood:20]  BLOOD ADMINISTERED: none  DRAINS: none   LOCAL MEDICATIONS USED:  marcaine morphine clonidine exparel vanco powder  SPECIMEN:  No Specimen  COUNTS:  YES  TOURNIQUET:   Total Tourniquet Time Documented: Thigh (Right) - 85 minutes Total: Thigh (Right) - 85 minutes   DICTATION: .Other Dictation: Dictation Number [id number]  PLAN OF CARE: Admit for overnight observation  PATIENT DISPOSITION:  PACU - hemodynamically stable

## 2023-04-08 NOTE — Anesthesia Procedure Notes (Addendum)
Anesthesia Regional Block: Adductor canal block   Pre-Anesthetic Checklist: , timeout performed,  Correct Patient, Correct Site, Correct Laterality,  Correct Procedure, Correct Position, site marked,  Risks and benefits discussed,  Surgical consent,  Pre-op evaluation,  At surgeon's request and post-op pain management  Laterality: Left  Prep: chloraprep       Needles:  Injection technique: Single-shot  Needle Type: Echogenic Stimulator Needle     Needle Length: 5cm  Needle Gauge: 22     Additional Needles:   Procedures:,,,, ultrasound used (permanent image in chart),,    Narrative:  Start time: 04/08/2023 1:30 PM End time: 04/08/2023 1:35 PM Injection made incrementally with aspirations every 5 mL.  Performed by: Personally  Anesthesiologist: Bethena Midget, MD  Additional Notes: Functioning IV was confirmed and monitors were applied.  A 50mm 22ga Arrow echogenic stimulator needle was used. Sterile prep and drape,hand hygiene and sterile gloves were used. Ultrasound guidance: relevant anatomy identified, needle position confirmed, local anesthetic spread visualized around nerve(s)., vascular puncture avoided.  Image printed for medical record. Negative aspiration and negative test dose prior to incremental administration of local anesthetic. The patient tolerated the procedure well.

## 2023-04-08 NOTE — Transfer of Care (Signed)
Immediate Anesthesia Transfer of Care Note  Patient: Patrick [name]  Procedure(s) Performed: RIGHT TOTAL KNEE ARTHROPLASTY (Right: Knee)  Patient Location: PACU  Anesthesia Type:MAC combined with regional for post-op pain  Level of Consciousness: awake, alert , and oriented  Airway & Oxygen Therapy: Patient Spontanous Breathing  Post-op Assessment: Report given to RN, Post -op Vital signs reviewed and stable, Patient moving all extremities X 4, and Patient able to stick tongue midline  Post vital signs: Reviewed and stable  Last Vitals:  Vitals Value Taken Time  BP 111/66   Temp 98.5   Pulse 88 04/08/23 1704  Resp 21 04/08/23 1704  SpO2 92 % 04/08/23 1704  Vitals shown include unfiled device data.  Last Pain:  Vitals:   04/08/23 1330  PainSc: 0-No pain      Patients Stated Pain Goal: 0 (04/08/23 1253)  Complications: No notable events documented.

## 2023-04-08 NOTE — Anesthesia Procedure Notes (Signed)
Spinal  Patient location during procedure: OR Start time: 04/08/2023 2:15 PM End time: 04/08/2023 2:20 PM Reason for block: surgical anesthesia Staffing Performed by: Bethena Midget, MD Authorized by: Bethena Midget, MD   Preanesthetic Checklist Completed: patient identified, IV checked, site marked, risks and benefits discussed, surgical consent, monitors and equipment checked, pre-op evaluation and timeout performed Spinal Block Patient position: sitting Prep: DuraPrep Patient monitoring: heart rate, cardiac monitor, continuous pulse ox and blood pressure Approach: midline Location: L4-5 Injection technique: single-shot Needle Needle type: Sprotte  Needle gauge: 24 G Needle length: 9 cm Assessment Sensory level: T4 Events: CSF return

## 2023-04-08 NOTE — Op Note (Signed)
Patrick [name], Patrick [name] MEDICAL RECORD NO: [id number] ACCOUNT NO: [id number] DATE OF BIRTH: Jul 04, 1960 FACILITY: [facility] LOCATION: [facility] PHYSICIAN: Patrick Shiver. August Saucer, MD  Operative Report   PREOPERATIVE DIAGNOSIS:  Right knee arthritis.  POSTOPERATIVE DIAGNOSIS:  Right knee arthritis.  PROCEDURE:  Right total knee replacement using Triathlon press fit cruciate retaining size 4 femur, press-fit Triathlon deep dish polyethylene insert size 11 mm, tibial press fit size 5, and asymmetric patella size 35.  SURGEON:  Patrick Shiver. August Saucer, MD.  ASSISTANT:  Patrick Cai, PA.  INDICATIONS:  This is a 63 year old patient with end-stage right knee arthritis.  He has done well with his left total knee replacement.  He presents now for operative management of his right knee arthritis, which has been refractory to nonoperative  care.  PROCEDURE IN DETAIL:  The patient was brought to the operating room where spinal anesthetic was induced.  Preoperative antibiotics administered.  Timeout was called.  Right knee was prescrubbed with alcohol and Betadine, and allowed to air dry.  Prepped  with DuraPrep solution and draped in a sterile manner.  Patrick [name] was used to cover the operative field.  The leg was then elevated and examined with the Esmarch wrap.  Tourniquet was inflated.  Anterior approach to the knee was made.  IrriSept solution was  utilized.  Median parapatellar approach was made and marked with #1 Vicryl suture.  IrriSept solution was utilized again in the arthrotomy.  Medial soft tissue dissection was performed proportional to the patient's moderate preop varus deformity.  Fat  pad partially excised.  Lateral patellofemoral ligament was released.  Soft tissue removed from the anterior distal femur.  Patella everted.  Osteophytes were removed. ACL released.  Anterior horn lateral meniscus released.  Severe tricompartmental  arthritis was present.  Collateral ligament retractors and posterior retractors were placed.   Patrick [name] was used to create starting point on the tibia.  Intramedullary alignment was then used to make a cut perpendicular to the mechanical axis of the tibia,  10 mm off the least affected lateral tibial plateau.  This was later revised 2 more mm to get under sclerotic bone.  An 8 mm cut was then made off the femur using intramedullary alignment set at 5 degrees of valgus.  The extension space was 10 mm with  good extension with testing with a spacer.  The femur was sized to a size 4. The cutting block was placed in 3 degrees of external rotation, which gave symmetric flexion and extension gaps.  Anterior, posterior, and chamfer cuts were then made.  Tibial  baseplate was then placed in line with the medial third of the tibial tubercle.  Trial femur was placed and we placed both the 9 and 11 mm spacer.  The 11 mm spacer gave about 2 degrees of hyperextension, but very good stability to varus and valgus  stress at 0, 30, and 90 degrees.  Patella was then cut down from 25 to 15 mm and a 3 peg patella trial was placed.  With all trial components in position, the patient had full extension, full flexion, no lift-off, excellent tracking of the patella using  no thumbs technique. Preparations were completed on both the femur and the tibia.  At this time, thorough irrigation was performed with 3 liters of irrigating solution after removing the trial components.  Capsule anesthetized using Marcaine, saline, and  Exparel.  Next, we allowed TXA sponge and IrriSept to sit 3 minutes within the knee cavity.  These were removed.  IrriSept solution was then used to irrigate out the tibial canal and we placed vancomycin powder into the canal.  The tibia was then  press-fit into position with very good press-fit achieved.  Femur was press-fit into position.  We placed an 11-mm true spacer along with the patella.  The same stability parameters were maintained.  Tourniquet was released.  Bleeding points were  encountered  using electrocautery.  3 liters of pouring irrigation was then utilized.  Arthrotomy was then closed over a bolster using #1 Vicryl suture.  Prior to final closure, we did irrigate the knee out again with IrriSept solution, sucked that out,  and then placed in vancomycin powder into the knee joint.  Completion of the arthrotomy closure was then performed with #1 Vicryl suture.  Injected the knee at that time with Marcaine, morphine, and clonidine.  We then irrigated on top of the arthrotomy  with IrriSept solution, placed vancomycin powder, and then we closed the incision using 0 Vicryl suture, 2-0 Vicryl suture and 3-0 Monocryl with Steri-Strips, soft sterile dressing applied.  Ace wrap and knee immobilizer applied.  Patrick [name]'s assistance was  required for mobilization of tissue, opening, closing, and retraction.  His assistance was a medical necessity.    NIK D: 04/08/2023 5:19:31 pm T: 04/08/2023 10:09:00 pm  JOB: [id number]/ [id number]

## 2023-04-09 ENCOUNTER — Encounter (HOSPITAL_COMMUNITY): Payer: Self-pay | Admitting: Orthopedic Surgery

## 2023-04-09 DIAGNOSIS — Z96652 Presence of left artificial knee joint: Secondary | ICD-10-CM | POA: Diagnosis not present

## 2023-04-09 DIAGNOSIS — M1711 Unilateral primary osteoarthritis, right knee: Secondary | ICD-10-CM | POA: Diagnosis not present

## 2023-04-09 DIAGNOSIS — I10 Essential (primary) hypertension: Secondary | ICD-10-CM | POA: Diagnosis not present

## 2023-04-09 DIAGNOSIS — Z96651 Presence of right artificial knee joint: Secondary | ICD-10-CM | POA: Diagnosis not present

## 2023-04-09 DIAGNOSIS — J45909 Unspecified asthma, uncomplicated: Secondary | ICD-10-CM | POA: Diagnosis not present

## 2023-04-09 DIAGNOSIS — Z87891 Personal history of nicotine dependence: Secondary | ICD-10-CM | POA: Diagnosis not present

## 2023-04-09 MED ORDER — DOCUSATE SODIUM 100 MG PO CAPS: 100.0000 mg | ORAL_CAPSULE | Freq: Two times a day (BID) | ORAL | 0 refills | Status: DC

## 2023-04-09 MED ORDER — ACETAMINOPHEN 325 MG PO TABS: 325.0000 mg | ORAL_TABLET | Freq: Four times a day (QID) | ORAL | 0 refills | Status: DC | PRN

## 2023-04-09 MED ORDER — CELECOXIB 100 MG PO CAPS: 100.0000 mg | ORAL_CAPSULE | Freq: Two times a day (BID) | ORAL | 0 refills | Status: DC

## 2023-04-09 MED ORDER — METHOCARBAMOL 500 MG PO TABS: 500.0000 mg | ORAL_TABLET | Freq: Four times a day (QID) | ORAL | 0 refills | Status: DC | PRN

## 2023-04-09 MED ORDER — ASPIRIN 81 MG PO CHEW: 81.0000 mg | CHEWABLE_TABLET | Freq: Two times a day (BID) | ORAL | 0 refills | Status: DC

## 2023-04-09 MED ORDER — OXYCODONE HCL 5 MG PO TABS: 5.0000 mg | ORAL_TABLET | ORAL | 0 refills | Status: DC | PRN

## 2023-04-09 NOTE — Progress Notes (Signed)
 Orthopedic Tech Progress Note Patient Details:  Patrick [name] December 02, 1960 [id number]  Applied CPM on patient yesterday as soon as he got up to the floor with wife at bedside. I just forgot to chart it Patient ID: [name], male   DOB: [birth year], 63 y.o.   MRN: [id number]  Delanna LITTIE Pac 04/09/2023, 7:35 AM

## 2023-04-09 NOTE — Progress Notes (Signed)
 Discharge instructions given. Patient verbalized understanding and all questions were answered.  ?

## 2023-04-09 NOTE — Progress Notes (Signed)
 Physical Therapy Evaluation and Discharge  Patient Details Name: Patrick [name] MRN: [id number] DOB: 1960-10-07 Today's Date: 04/09/2023  History of Present Illness  63 y.o. male presents to [facility] hospital on 04/08/2023 for elective R TKA. PMH includes right shoulder bursitis, HTN, hiatal hernia, HLD, L TKA, CAD.  Clinical Impression  Patient lives at home with his wife and is independent with ADLs/IADLs. Session began with a review of HEP and patient adheres compliance.The patient was independent with bed mobility transfers, using good UE support to transition EOB and demonstrating the ability to move bilateral LEs to EOB. Patient was Mod I with transfers (sit > stand and stand > sit), maintaining weightbearing restrictions. Patient utilized UE support to assist with push off from a seated position and manages to distribute weight through both LEs. The patient was able to ambulate 378ft within the unit, demonstrating good cadence and demonstrating a step through pattern. Patient ascended/descended 3 steps with CGA +1 to ensure safety, although assist level not required. No further consults needed from acute care PT and the patient will benefit from OPPT.       If plan is discharge home, recommend the following: Assistance with cooking/housework;Assist for transportation   Can travel by private vehicle        Equipment Recommendations None recommended by PT  Recommendations for Other Services       Functional Status Assessment Patient has had a recent decline in their functional status and demonstrates the ability to make significant improvements in function in a reasonable and predictable amount of time.     Precautions / Restrictions Precautions Required Braces or Orthoses: Knee Immobilizer - Right Knee Immobilizer - Right: On when out of bed or walking Restrictions Weight Bearing Restrictions Per Provider Order: Yes RLE Weight Bearing Per Provider Order: Weight bearing as tolerated       Mobility  Bed Mobility Overal bed mobility: Independent             General bed mobility comments: Good use of UE support to transition EOB, able to move legs independently    Transfers Overall transfer level: Modified independent Equipment used: Rolling walker (2 wheels)               General transfer comment: Patient utilizes good hand placement needed to assist with push off from a seated position. Manages to distribute weight through both LEs    Ambulation/Gait Ambulation/Gait assistance: Modified independent (Device/Increase time) Gait Distance (Feet): 328 Feet Assistive device: Rolling walker (2 wheels) Gait Pattern/deviations: Step-through pattern       General Gait Details: Good cadence, no rest breaks, no reports of increased pain during ambulation  Stairs Stairs: Yes Stairs assistance: Contact guard assist Stair Management: One rail Left, Step to pattern, Forwards Number of Stairs: 3 General stair comments: CGA +1 to ensure safety, although patient may not have required CGA. Pt was able to ascend and descend stairs safely with no difficulty  Wheelchair Mobility     Tilt Bed    Modified Rankin (Stroke Patients Only)       Balance Overall balance assessment: Independent                                           Pertinent Vitals/Pain Pain Assessment Pain Assessment: 0-10 Pain Score: 2  Pain Location: R knee Pain Descriptors / Indicators: Aching, Tender Pain Intervention(s): Monitored during session,  Premedicated before session, Ice applied    Home Living Family/patient expects to be discharged to:: Private residence Living Arrangements: Spouse/significant other Available Help at Discharge: Family Type of Home: House Home Access: Stairs to enter Entrance Stairs-Rails: Left Entrance Stairs-Number of Steps: 3 Alternate Level Stairs-Number of Steps: 13 Home Layout: Two level Home Equipment: Agricultural Consultant (2  wheels);Cane - single point;Shower seat      Prior Function Prior Level of Function : Independent/Modified Independent;Driving               ADLs Comments: Works part-time     Programmer, Systems Upper Extremity Assessment: Overall WFL for tasks assessed    Lower Extremity Assessment Lower Extremity Assessment: RLE deficits/detail RLE Deficits / Details: Pain, decreased ROM RLE: Unable to fully assess due to immobilization RLE Sensation: WNL RLE Coordination: decreased gross motor    Cervical / Trunk Assessment Cervical / Trunk Assessment: Normal  Communication   Communication Communication: No apparent difficulties  Cognition Arousal: Alert Behavior During Therapy: WFL for tasks assessed/performed Overall Cognitive Status: Within Functional Limits for tasks assessed                                          General Comments General comments (skin integrity, edema, etc.): R Knee ROM: -11-59    Exercises General Exercises - Lower Extremity Ankle Circles/Pumps: AROM, Right, 10 reps, Supine Quad Sets: AROM, Right, 10 reps, Supine Short Arc Quad: AROM, Right, Other (comment) (8 reps) Long Arc Quad: AROM, Right, Seated, Other reps (comment) (8 reps) Heel Slides: AAROM, Right, Seated, Other reps (comment) (8 reps) Hip ABduction/ADduction: AROM, Right, 10 reps, Supine Straight Leg Raises: AROM, Right, Supine, Other reps (comment) (8 reps)   Assessment/Plan    PT Assessment All further PT needs can be met in the next venue of care  PT Problem List Decreased strength;Decreased range of motion;Pain;Decreased activity tolerance       PT Treatment Interventions      PT Goals (Current goals can be found in the Care Plan section)  Acute Rehab PT Goals Patient Stated Goal: No Knee Pain, return to PLOF PT Goal Formulation: With patient Time For Goal Achievement: 04/23/23 Potential to Achieve Goals: Good     Frequency       Co-evaluation               AM-PAC PT 6 Clicks Mobility  Outcome Measure Help needed turning from your back to your side while in a flat bed without using bedrails?: None Help needed moving from lying on your back to sitting on the side of a flat bed without using bedrails?: None Help needed moving to and from a bed to a chair (including a wheelchair)?: None Help needed standing up from a chair using your arms (e.g., wheelchair or bedside chair)?: None Help needed to walk in hospital room?: None Help needed climbing 3-5 steps with a railing? : None 6 Click Score: 24    End of Session Equipment Utilized During Treatment: Gait belt Activity Tolerance: Patient tolerated treatment well Patient left: in chair;with call bell/phone within reach Nurse Communication: Mobility status;Other (comment) (RN notified that PT has cleared paitient from services) PT Visit Diagnosis: Muscle weakness (generalized) (M62.81);Other abnormalities of gait and mobility (R26.89);Pain Pain - Right/Left: Right Pain - part of body: Knee    Time: 0822-0858 PT Time Calculation (min) (  ACUTE ONLY): 36 min   Charges:   PT Evaluation $PT Eval Low Complexity: 1 Low PT Treatments $Gait Training: 8-22 mins PT General Charges $$ ACUTE PT VISIT: 1 Visit         Deanie DOROTHA Harms, SPT   Drena Ham 04/09/2023, 10:30 AM

## 2023-04-09 NOTE — Progress Notes (Signed)
  Subjective: Patient stable.  Pain controlled.  Has ambulated in the hall.   Objective: Vital signs in last 24 hours: Temp:  [98.2 F (36.8 C)-98.7 F (37.1 C)] 98.3 F (36.8 C) (02/04 1405) Pulse Rate:  [82-103] 91 (02/04 1405) Resp:  [15-22] 18 (02/04 0600) BP: (111-134)/(59-82) 128/70 (02/04 1405) SpO2:  [91 %-96 %] 96 % (02/04 1405)  Intake/Output from previous day: 02/03 0701 - 02/04 0700 In: 1120 [P.O.:120; I.V.:1000] Out: 2520 [Urine:2500; Blood:20] Intake/Output this shift: Total I/O In: -  Out: 1200 [Urine:1200]  Exam:  Sensation intact distally Intact pulses distally Dorsiflexion/Plantar flexion intact  Labs: No results for input(s): HGB in the last 72 hours. No results for input(s): WBC, RBC, HCT, PLT in the last 72 hours. No results for input(s): NA, K, CL, CO2, BUN, CREATININE, GLUCOSE, CALCIUM  in the last 72 hours. No results for input(s): LABPT, INR in the last 72 hours.  Assessment/Plan: Plan at this time is to discharge to home.  He is able to do a straight leg raise.  Having little bit of paresthesias in the right hand thumb and index finger which may be subclinical carpal tunnel which is a little symptomatic from the volume that he had.  We can watch that and follow-up.  He is discharged home in good condition and will follow-up in 2 weeks.   G Scott Seaton Hofmann 04/09/2023, 3:35 PM

## 2023-04-09 NOTE — Anesthesia Postprocedure Evaluation (Signed)
Anesthesia Post Note  Patient: Merrel Crabbe  Procedure(s) Performed: RIGHT TOTAL KNEE ARTHROPLASTY (Right: Knee)     Patient location during evaluation: PACU Anesthesia Type: Regional Level of consciousness: awake and alert Pain management: pain level controlled Vital Signs Assessment: post-procedure vital signs reviewed and stable Respiratory status: spontaneous breathing, nonlabored ventilation, respiratory function stable and patient connected to nasal cannula oxygen Cardiovascular status: blood pressure returned to baseline and stable Postop Assessment: no apparent nausea or vomiting Anesthetic complications: no   No notable events documented.  Last Vitals:  Vitals:   04/09/23 0731 04/09/23 1405  BP: 134/64 128/70  Pulse: 82 91  Resp:    Temp: 36.8 C 36.8 C  SpO2: 95% 96%    Last Pain:  Vitals:   04/09/23 1405  TempSrc: Oral  PainSc:                  Mariann Barter

## 2023-04-10 ENCOUNTER — Telehealth: Payer: Self-pay | Admitting: Orthopedic Surgery

## 2023-04-10 NOTE — Telephone Encounter (Signed)
I called.  Likely atelectasis.  Encouraged him to use the incentive spirometer and it should be resolving by the weekend.

## 2023-04-10 NOTE — Telephone Encounter (Signed)
 Patient called triage this AM reporting that he has been running a fever of 101.5 yesterday and 102 this morning. S/p TKA on 04/08/23. He noted that prior to surgery he did develop a post nasal drip and the hospital told him that was okay to still proceed with surgery. Now he is coughing up green mucus w/ the fever. No problems with his surgical site. Everything is okay in that aspect. I let him know that he should probably reach out to his PCP for this as this is not orthopedic problem but I will still send the message to Dr. Addie team as an RICK in case anything different.  [phone]

## 2023-04-11 ENCOUNTER — Telehealth (INDEPENDENT_AMBULATORY_CARE_PROVIDER_SITE_OTHER): Payer: 59 | Admitting: Internal Medicine

## 2023-04-11 ENCOUNTER — Telehealth: Payer: Self-pay | Admitting: Orthopedic Surgery

## 2023-04-11 DIAGNOSIS — E559 Vitamin D deficiency, unspecified: Secondary | ICD-10-CM

## 2023-04-11 DIAGNOSIS — J329 Chronic sinusitis, unspecified: Secondary | ICD-10-CM | POA: Diagnosis not present

## 2023-04-11 DIAGNOSIS — R739 Hyperglycemia, unspecified: Secondary | ICD-10-CM | POA: Diagnosis not present

## 2023-04-11 DIAGNOSIS — J3089 Other allergic rhinitis: Secondary | ICD-10-CM

## 2023-04-11 MED ORDER — HYDROCODONE BIT-HOMATROP MBR 5-1.5 MG/5ML PO SOLN: 5.0000 mL | Freq: Four times a day (QID) | ORAL | 0 refills | Status: AC | PRN

## 2023-04-11 MED ORDER — CEFDINIR 300 MG PO CAPS: 300.0000 mg | ORAL_CAPSULE | Freq: Two times a day (BID) | ORAL | 0 refills | Status: DC

## 2023-04-11 NOTE — Progress Notes (Signed)
 Patient ID: Patrick [name], male   DOB: 03-Dec-1960, 63 y.o.   MRN: [id number]  Virtual Visit via Video Note  I connected with Patrick [name] on 04/12/23 at  3:20 PM EST by a video enabled telemedicine application and verified that I am speaking with the correct person using two identifiers.  Location of all participants today Patient:  at home Provider: at office   I discussed the limitations of evaluation and management by telemedicine and the availability of in person appointments. The patient expressed understanding and agreed to proceed.  History of Present Illness:  Here with 2-3 days acute onset fever, facial pain, pressure, headache, general weakness and malaise, and greenish d/c, with mild ST and cough, but pt denies chest pain, wheezing, increased sob or doe, orthopnea, PND, increased LE swelling, palpitations, dizziness or syncope.  Just had right knee TKR 3 days ago.  Temp has been up to 102   Pt denies polydipsia, polyuria, or new focal neuro s/s.    Pt denies fever, wt loss, night sweats, loss of appetite, or other constitutional symptoms  Does have several wks ongoing nasal allergy  symptoms with clearish congestion, itch and sneezing, without fever, pain, ST, cough, swelling or wheezing. Past Medical History:  Diagnosis Date   Allergy     Arthritis    left knee   Dysrhythmia     [name]   Heart murmur    History of hiatal hernia    4 years ago per patient- no surgery needed   History of kidney stones    Hx of adenomatous polyp of colon    Hyperlipidemia    Hypertension    Past Surgical History:  Procedure Laterality Date   APPENDECTOMY     Arthroscopic knee surgeries on both knees     CHOLECYSTECTOMY     COLONOSCOPY     NASAL SEPTOPLASTY W/ TURBINOPLASTY Bilateral 04/13/2019   Procedure: NASAL SEPTOPLASTY WITH BILATERAL TURBINATE REDUCTION;  Surgeon: Karis Clunes, MD;  Location: [facility];  Service: ENT;  Laterality:  Bilateral;   TOTAL KNEE ARTHROPLASTY Left 01/21/2020   Procedure: LEFT TOTAL KNEE ARTHROPLASTY;  Surgeon: Addie Cordella Hamilton, MD;  Location: [facility] OR;  Service: Orthopedics;  Laterality: Left;   TOTAL KNEE ARTHROPLASTY Right 04/08/2023   Procedure: RIGHT TOTAL KNEE ARTHROPLASTY;  Surgeon: Addie Cordella Hamilton, MD;  Location: [facility] OR;  Service: Orthopedics;  Laterality: Right;   VASECTOMY      reports that he has quit smoking. His smoking use included cigars and cigarettes. He has never used smokeless tobacco. He reports current alcohol use of about 5.0 standard drinks of alcohol per week. He reports that he does not use drugs. family history is not on file. He was adopted. Allergies  Allergen Reactions   Penicillins Hives    Rash around neck; 04/08/23 tolerated cefazolin     Current Outpatient Medications on File Prior to Visit  Medication Sig Dispense Refill   acetaminophen  (TYLENOL ) 325 MG tablet Take 1-2 tablets (325-650 mg total) by mouth every 6 (six) hours as needed for mild pain (pain score 1-3) (or temp > 100.5). 50 tablet 0   Ascorbic Acid (VITAMIN C) 1000 MG tablet Take 1,000 mg by mouth daily.     aspirin  81 MG chewable tablet Chew 1 tablet (81 mg total) by mouth 2 (two) times daily. 60 tablet 0   celecoxib  (CELEBREX ) 100 MG capsule Take 1 capsule (100 mg total) by mouth 2 (two) times  daily. 30 capsule 0   cetirizine (ZYRTEC) 10 MG tablet Take 10 mg by mouth at bedtime.      cholecalciferol (VITAMIN D3) 25 MCG (1000 UNIT) tablet Take 1,000 Units by mouth daily.      Cyanocobalamin  (B-12) 2000 MCG TABS Take 2,000 mcg by mouth daily.     docusate sodium  (COLACE) 100 MG capsule Take 1 capsule (100 mg total) by mouth 2 (two) times daily. 10 capsule 0   fluticasone  (FLONASE ) 50 MCG/ACT nasal spray Place 1 spray into both nostrils daily. 15.8 mL 0   gabapentin  (NEURONTIN ) 100 MG capsule Take 2 capsules (200 mg total) by mouth at bedtime. 180 capsule 0   Ginger, Zingiber officinalis, (GINGER  ROOT) 550 MG CAPS Take 550 mg by mouth daily.      methocarbamol  (ROBAXIN ) 500 MG tablet Take 1 tablet (500 mg total) by mouth every 6 (six) hours as needed for muscle spasms. 30 tablet 0   Misc Natural Products (TART CHERRY ADVANCED) CAPS Take 1 capsule by mouth daily.      Multiple Vitamin (MULTIVITAMIN WITH MINERALS) TABS tablet Take 1 tablet by mouth daily. Men's Over 50      oxyCODONE  (OXY IR/ROXICODONE ) 5 MG immediate release tablet Take 1-2 tablets (5-10 mg total) by mouth every 4 (four) hours as needed for moderate pain (pain score 4-6) (pain score 4-6). 30 tablet 0   rosuvastatin  (CRESTOR ) 20 MG tablet Take 1 tablet (20 mg total) by mouth daily. 90 tablet 3   telmisartan  (MICARDIS ) 80 MG tablet Take 1 tablet (80 mg total) by mouth daily. 90 tablet 3   Turmeric Curcumin 500 MG CAPS Take 500 mg by mouth daily.      No current facility-administered medications on file prior to visit.    Observations/Objective: Alert, mild ill, appropriate mood and affect, resps normal, cn 2-12 intact, moves all 4s, no visible rash or swelling Lab Results  Component Value Date   WBC 5.8 04/02/2023   HGB 15.1 04/02/2023   HCT 45.3 04/02/2023   PLT 218 04/02/2023   GLUCOSE 100 (H) 04/02/2023   CHOL 111 08/27/2022   TRIG 160.0 (H) 08/27/2022   HDL 41.80 08/27/2022   LDLCALC 37 08/27/2022   ALT 27 08/27/2022   AST 22 08/27/2022   NA 141 04/02/2023   K 4.3 04/02/2023   CL 103 04/02/2023   CREATININE 1.04 04/02/2023   BUN 18 04/02/2023   CO2 27 04/02/2023   TSH 1.62 08/27/2022   PSA 0.39 08/27/2022   HGBA1C 5.2 08/27/2022   Assessment and Plan: See notes  Follow Up Instructions: See notes   I discussed the assessment and treatment plan with the patient. The patient was provided an opportunity to ask questions and all were answered. The patient agreed with the plan and demonstrated an understanding of the instructions.   The patient was advised to call back or seek an in-person evaluation if  the symptoms worsen or if the condition fails to improve as anticipated.  Lynwood Rush, MD

## 2023-04-11 NOTE — Telephone Encounter (Signed)
 Received call from Patrick [name]-(PT) with [facility] needing verbal orders to delay and start (PT) tomorrow or Saturday. Patient is having respiratory issue.  The number to contact [place] is [phone]

## 2023-04-11 NOTE — Telephone Encounter (Signed)
 IC LMVM with verbal

## 2023-04-12 ENCOUNTER — Encounter: Payer: Self-pay | Admitting: Internal Medicine

## 2023-04-12 DIAGNOSIS — M25561 Pain in right knee: Secondary | ICD-10-CM | POA: Diagnosis not present

## 2023-04-12 DIAGNOSIS — Z471 Aftercare following joint replacement surgery: Secondary | ICD-10-CM | POA: Diagnosis not present

## 2023-04-12 NOTE — Assessment & Plan Note (Signed)
 Mild to mod, for restart zyrtec and flonase  asd, to f/u any worsening symptoms or concerns

## 2023-04-12 NOTE — Assessment & Plan Note (Signed)
Last vitamin D Lab Results  Component Value Date   VD25OH 67.20 08/27/2022   Stable, cont oral replacement

## 2023-04-12 NOTE — Assessment & Plan Note (Signed)
Lab Results  Component Value Date   HGBA1C 5.2 08/27/2022   Stable, pt to continue current medical treatment  - diet, wt control

## 2023-04-12 NOTE — Assessment & Plan Note (Signed)
 Mild to mod, for antibx course omnicef  300 bid, cough med prn,,  to f/u any worsening symptoms or concerns

## 2023-04-12 NOTE — Patient Instructions (Signed)
 Please take all new medication as prescribed

## 2023-04-15 DIAGNOSIS — Z471 Aftercare following joint replacement surgery: Secondary | ICD-10-CM | POA: Diagnosis not present

## 2023-04-15 DIAGNOSIS — M25561 Pain in right knee: Secondary | ICD-10-CM | POA: Diagnosis not present

## 2023-04-17 DIAGNOSIS — M25561 Pain in right knee: Secondary | ICD-10-CM | POA: Diagnosis not present

## 2023-04-17 DIAGNOSIS — Z471 Aftercare following joint replacement surgery: Secondary | ICD-10-CM | POA: Diagnosis not present

## 2023-04-18 ENCOUNTER — Other Ambulatory Visit: Payer: Self-pay | Admitting: Orthopedic Surgery

## 2023-04-19 DIAGNOSIS — Z471 Aftercare following joint replacement surgery: Secondary | ICD-10-CM | POA: Diagnosis not present

## 2023-04-19 DIAGNOSIS — M25561 Pain in right knee: Secondary | ICD-10-CM | POA: Diagnosis not present

## 2023-04-20 ENCOUNTER — Other Ambulatory Visit: Payer: Self-pay | Admitting: Orthopedic Surgery

## 2023-04-21 DIAGNOSIS — M1711 Unilateral primary osteoarthritis, right knee: Secondary | ICD-10-CM

## 2023-04-22 ENCOUNTER — Encounter: Payer: Self-pay | Admitting: Surgical

## 2023-04-22 ENCOUNTER — Other Ambulatory Visit: Payer: Self-pay

## 2023-04-22 ENCOUNTER — Ambulatory Visit (INDEPENDENT_AMBULATORY_CARE_PROVIDER_SITE_OTHER): Payer: 59 | Admitting: Surgical

## 2023-04-22 ENCOUNTER — Other Ambulatory Visit (INDEPENDENT_AMBULATORY_CARE_PROVIDER_SITE_OTHER): Payer: Self-pay

## 2023-04-22 DIAGNOSIS — Z96651 Presence of right artificial knee joint: Secondary | ICD-10-CM

## 2023-04-22 DIAGNOSIS — M25551 Pain in right hip: Secondary | ICD-10-CM

## 2023-04-22 MED ORDER — METHYLPREDNISOLONE ACETATE 40 MG/ML IJ SUSP
40.0000 mg | INTRAMUSCULAR | Status: AC | PRN
  Administered 2023-04-22: 40 mg via INTRA_ARTICULAR

## 2023-04-22 MED ORDER — BUPIVACAINE HCL 0.25 % IJ SOLN
4.0000 mL | INTRAMUSCULAR | Status: AC | PRN
  Administered 2023-04-22: 4 mL via INTRA_ARTICULAR

## 2023-04-22 MED ORDER — LIDOCAINE HCL 1 % IJ SOLN
5.0000 mL | INTRAMUSCULAR | Status: AC | PRN
Start: 2023-04-22 — End: 2023-04-22
  Administered 2023-04-22: 5 mL

## 2023-04-22 NOTE — Progress Notes (Signed)
 Post-Op Visit Note   Patient: Patrick [name]           Date of Birth: 05-10-1960           MRN: [id number] Visit Date: 04/22/2023 PCP: Corwin Levins, MD   Assessment & Plan:  Chief Complaint:  Chief Complaint  Patient presents with   Right Knee - Follow-up    Right total knee arthroplasty 04/08/2023   Visit Diagnoses:  1. S/P total knee arthroplasty, right   2. Pain in right hip     Plan: Patrick [name] is a 63 y.o. male who presents s/p right total knee arthroplasty on 04/08/2023.  Doing well overall.  Using CPM machine up to 100 degrees.  They deny any calf pain, shortness of breath, chest pain, abdominal pain.  Pain is overall controlled and taking Tylenol during the day and oxycodone at night for pain control.  Taking aspirin for DVT prophylaxis.  Ambulating with cane on occasion.  Does have complaint of anterolateral hip pain since surgery.  Has tried massage, heat without any relief.  Had similar symptoms after surgery after his last knee replacement that improved with intra-articular hip injection in the left hip.  No back pain.  No numbness or tingling.  No burning sensation.  No radicular pain.  On exam, patient has range of motion 3 degrees extension to about 100 degrees of knee flexion.  Incision is healing well without evidence of infection or dehiscence.  2+ DP pulse of the operative extremity.  No calf tenderness, negative Homans' sign.  Able to perform straight leg raise.  Intact ankle dorsiflexion.  No pain with reproduced with FADIR sign.  Negative Stinchfield sign.  Does have pain reproduced with palpation of the posterior aspect of the greater trochanter and pain with resisted hip abduction and lateral position.  Plan is start physical therapy upstairs for the right knee.  Trochanteric injection administered under ultrasound guidance to the right trochanteric bursa today.  If this does not help, could consider intra-articular hip injection which he had in the past for his left  hip and this helps.  Currently today on exam, really does not seem to be coming from the hip joint itself based on really no pain with flexion/internal rotation.  Follow-up in 4 weeks for clinical recheck with Dr. August Saucer.  He will let us know how the injection did in about 1 week.    Procedure Note  Patient: Patrick [name]             Date of Birth: 07-12-1960           MRN: [id number]             Visit Date: 04/22/2023  Procedures: Visit Diagnoses:  1. S/P total knee arthroplasty, right   2. Pain in right hip     Large Joint Inj: R greater trochanter on 04/22/2023 4:06 PM Indications: pain and diagnostic evaluation Details: 18 G 3.5 in needle, ultrasound-guided lateral approach  Arthrogram: No  Medications: 5 mL lidocaine 1 %; 4 mL bupivacaine 0.25 %; 40 mg methylPREDNISolone acetate 40 MG/ML Outcome: tolerated well, no immediate complications Procedure, treatment alternatives, risks and benefits explained, specific risks discussed. Consent was given by the patient. Immediately prior to procedure a time out was called to verify the correct patient, procedure, equipment, support staff and site/side marked as required. Patient was prepped and draped in the usual sterile fashion.         Follow-Up Instructions: Return in about 4  weeks (around 05/20/2023).   Orders:  Orders Placed This Encounter  Procedures   XR Knee 1-2 Views Right   US Guided Needle Placement - No Linked Charges   No orders of the defined types were placed in this encounter.   Imaging: XR Knee 1-2 Views Right Result Date: 04/22/2023 AP and lateral views of the knee reviewed.  Right total knee arthroplasty prosthesis in good position and alignment without any complicating features.  There is no evidence of dislocation, periprosthetic fracture, patella baja/alta   US Guided Needle Placement - No Linked Charges Result Date: 04/22/2023 Ultrasound imaging demonstrates needle placement deep to the iliotibial band  just superficial to the greater trochanter with injection of fluid around the trochanter.  No complication.  There was no significant resistance during administration of the injection and care was taken to avoid injection into the gluteal tendons.   PMFS History: Patient Active Problem List   Diagnosis Date Noted   Arthritis of right knee 04/21/2023   S/P total knee replacement, right 04/08/2023   Ulnar neuropathy at elbow 01/10/2023   Hx of adenomatous polyp of colon    B12 deficiency 09/02/2022   Asthmatic bronchitis 06/07/2022   Back pain 01/18/2022   Calcification of patellar tendon determined by X-ray 05/15/2021   Coronary artery calcification 12/08/2020   Melanocytic nevi of trunk 08/18/2020   Sebaceous cyst 08/18/2020   Seborrheic keratosis 08/18/2020   S/P total knee arthroplasty, 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   Acute bursitis of right shoulder 02/02/2019   Sinusitis 11/06/2018   Penicillin allergy 11/06/2018   Hip flexor tightness 09/29/2018   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   Hyperglycemia 08/02/2018   HLD (hyperlipidemia) 08/02/2018   Vitamin D deficiency 08/02/2018   Encounter for well adult exam with abnormal findings 07/30/2018   HTN (hypertension) 07/30/2018   Tinnitus aurium, 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   Snoring 05/09/2017   Other allergic rhinitis 02/21/2015   Past Medical History:  Diagnosis Date   Allergy    Arthritis    left knee   Dysrhythmia    " [name]"   Heart murmur    History of hiatal hernia    4 years ago per patient- no surgery needed   History of kidney stones    Hx of adenomatous polyp of colon    Hyperlipidemia    Hypertension     Family History  Adopted: Yes  Problem Relation Age of Onset   Colon cancer Neg Hx    Colon polyps Neg Hx    Esophageal cancer Neg Hx    Rectal cancer Neg Hx    Stomach cancer Neg Hx     Past Surgical History:  Procedure Laterality  Date   APPENDECTOMY     Arthroscopic knee surgeries on both knees     CHOLECYSTECTOMY     COLONOSCOPY     NASAL SEPTOPLASTY W/ TURBINOPLASTY Bilateral 04/13/2019   Procedure: NASAL SEPTOPLASTY WITH BILATERAL TURBINATE REDUCTION;  Surgeon: Newman Pies, MD;  Location: [facility];  Service: ENT;  Laterality: Bilateral;   TOTAL KNEE ARTHROPLASTY Left 01/21/2020   Procedure: LEFT TOTAL KNEE ARTHROPLASTY;  Surgeon: Cammy Copa, MD;  Location: [facility] OR;  Service: Orthopedics;  Laterality: Left;   TOTAL KNEE ARTHROPLASTY Right 04/08/2023   Procedure: RIGHT TOTAL KNEE ARTHROPLASTY;  Surgeon: Cammy Copa, MD;  Location: [facility] OR;  Service: Orthopedics;  Laterality: Right;   VASECTOMY  Social History   Occupational History   Occupation: Teacher, early years/pre, Network engineer, Semi-retired  Tobacco Use   Smoking status: Former    Types: Cigars, Cigarettes   Smokeless tobacco: Never   Tobacco comments:    one cigar every few years  Vaping Use   Vaping status: Never Used  Substance and Sexual Activity   Alcohol use: Yes    Alcohol/week: 5.0 standard drinks of alcohol    Types: 5 Standard drinks or equivalent per week    Comment: socially   Drug use: No   Sexual activity: Yes

## 2023-04-22 NOTE — Addendum Note (Signed)
 Addended by: Wendi Maya on: 04/22/2023 04:21 PM   Modules accepted: Orders

## 2023-04-23 ENCOUNTER — Other Ambulatory Visit: Payer: Self-pay | Admitting: Orthopedic Surgery

## 2023-04-23 ENCOUNTER — Other Ambulatory Visit: Payer: Self-pay | Admitting: Surgical

## 2023-04-23 DIAGNOSIS — Z471 Aftercare following joint replacement surgery: Secondary | ICD-10-CM | POA: Diagnosis not present

## 2023-04-23 DIAGNOSIS — M25561 Pain in right knee: Secondary | ICD-10-CM | POA: Diagnosis not present

## 2023-04-23 MED ORDER — OXYCODONE HCL 5 MG PO TABS: 5.0000 mg | ORAL_TABLET | Freq: Three times a day (TID) | ORAL | 0 refills | Status: DC | PRN

## 2023-04-23 MED ORDER — DOCUSATE SODIUM 100 MG PO CAPS: 100.0000 mg | ORAL_CAPSULE | Freq: Two times a day (BID) | ORAL | 0 refills | Status: DC

## 2023-04-25 ENCOUNTER — Ambulatory Visit (INDEPENDENT_AMBULATORY_CARE_PROVIDER_SITE_OTHER): Payer: 59 | Admitting: Physical Therapy

## 2023-04-25 ENCOUNTER — Other Ambulatory Visit: Payer: Self-pay

## 2023-04-25 DIAGNOSIS — M6281 Muscle weakness (generalized): Secondary | ICD-10-CM

## 2023-04-25 DIAGNOSIS — M25661 Stiffness of right knee, not elsewhere classified: Secondary | ICD-10-CM

## 2023-04-25 DIAGNOSIS — Z96651 Presence of right artificial knee joint: Secondary | ICD-10-CM | POA: Diagnosis not present

## 2023-04-25 DIAGNOSIS — R2689 Other abnormalities of gait and mobility: Secondary | ICD-10-CM

## 2023-04-25 DIAGNOSIS — M25561 Pain in right knee: Secondary | ICD-10-CM

## 2023-04-25 NOTE — Therapy (Signed)
 OUTPATIENT PHYSICAL THERAPY LOWER EXTREMITY EVALUATION   Patient Name: Patrick [name] MRN: [id number] DOB:[birth year], 63 y.o., male Today's Date: 04/25/2023  END OF SESSION:  PT End of Session - 04/25/23 1338     Visit Number 1    Number of Visits 16    Date for PT Re-Evaluation 06/20/23    Authorization Type [employer]    PT Start Time 1343    PT Stop Time 1440    PT Time Calculation (min) 57 min    Activity Tolerance Patient tolerated treatment well    Behavior During Therapy WFL for tasks assessed/performed             Past Medical History:  Diagnosis Date   Allergy    Arthritis    left knee   Dysrhythmia    " [name]"   Heart murmur    History of hiatal hernia    4 years ago per patient- no surgery needed   History of kidney stones    Hx of adenomatous polyp of colon    Hyperlipidemia    Hypertension    Past Surgical History:  Procedure Laterality Date   APPENDECTOMY     Arthroscopic knee surgeries on both knees     CHOLECYSTECTOMY     COLONOSCOPY     NASAL SEPTOPLASTY W/ TURBINOPLASTY Bilateral 04/13/2019   Procedure: NASAL SEPTOPLASTY WITH BILATERAL TURBINATE REDUCTION;  Surgeon: Newman Pies, MD;  Location: [facility];  Service: ENT;  Laterality: Bilateral;   TOTAL KNEE ARTHROPLASTY Left 01/21/2020   Procedure: LEFT TOTAL KNEE ARTHROPLASTY;  Surgeon: Cammy Copa, MD;  Location: [facility] OR;  Service: Orthopedics;  Laterality: Left;   TOTAL KNEE ARTHROPLASTY Right 04/08/2023   Procedure: RIGHT TOTAL KNEE ARTHROPLASTY;  Surgeon: Cammy Copa, MD;  Location: [facility] OR;  Service: Orthopedics;  Laterality: Right;   VASECTOMY     Patient Active Problem List   Diagnosis Date Noted   Arthritis of right knee 04/21/2023   S/P total knee replacement, right 04/08/2023   Ulnar neuropathy at elbow 01/10/2023   Hx of adenomatous polyp of colon    B12 deficiency 09/02/2022   Asthmatic bronchitis 06/07/2022   Back pain  01/18/2022   Calcification of patellar tendon determined by X-ray 05/15/2021   Coronary artery calcification 12/08/2020   Melanocytic nevi of trunk 08/18/2020   Sebaceous cyst 08/18/2020   Seborrheic keratosis 08/18/2020   S/P total knee arthroplasty, 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   Acute bursitis of right shoulder 02/02/2019   Sinusitis 11/06/2018   Penicillin allergy 11/06/2018   Hip flexor tightness 09/29/2018   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   Hyperglycemia 08/02/2018   HLD (hyperlipidemia) 08/02/2018   Vitamin D deficiency 08/02/2018   Encounter for well adult exam with abnormal findings 07/30/2018   HTN (hypertension) 07/30/2018   Tinnitus aurium, 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   Snoring 05/09/2017   Other allergic rhinitis 02/21/2015    PCP: Corwin Levins, MD  REFERRING PROVIDER: Julieanne Cotton, PA-C  REFERRING DIAG: [phone] (ICD-10-CM) - S/P total knee arthroplasty, right  THERAPY DIAG:  S/P total knee arthroplasty, right  Muscle weakness (generalized)  Other abnormalities of gait and mobility  Acute pain of right knee  Stiffness of right knee, not elsewhere classified  Rationale for Evaluation and Treatment: Rehabilitation  ONSET DATE: 04/08/23 R TKA  SUBJECTIVE:   SUBJECTIVE STATEMENT: Pt states biggest problem is R hip pain. Worked with  HHPT until last Tuesday. Pt was given cortisone shot on Monday (thinks it may be bursitis). Pt states his sleep has not been good. Has been using CPM at home. Pt states he is at 105 deg in CPM. Pt states he's been walking without assistance since last week.   PERTINENT HISTORY: L TKA  PAIN:  Are you having pain? Yes: NPRS scale: 2 currently, 5 or 6 at worst Pain location: R hip Pain description: nagging R lateral/posterior hip Aggravating factors: CPM, standing Relieving factors: Massager helps a little  PAIN:  Are you having pain? Yes: NPRS scale: 0 Pain location: anterior  thigh Pain description: Tightness Aggravating factors: Nothing Relieving factors: ice   PRECAUTIONS: None  RED FLAGS: None   WEIGHT BEARING RESTRICTIONS: No  FALLS:  Has patient fallen in last 6 months? No  LIVING ENVIRONMENT: Lives with: lives with their spouse Lives in: House/apartment Stairs: Yes: Internal: 18 steps; can reach both and External: 4 steps; on left going up Has following equipment at home: None  OCCUPATION: Retired -- Works part time for [employer] during baseball season (April)  PLOF: Independent  PATIENT GOALS: Return to normal activities without pain  NEXT MD VISIT: 05/20/23  OBJECTIVE:  Note: Objective measures were completed at Evaluation unless otherwise noted.  DIAGNOSTIC FINDINGS: n/a  PATIENT SURVEYS:  The Patient-Specific Functional Scale  Initial:  I am going to ask you to identify up to 3 important activities that you are unable to do or are having difficulty with as a result of this problem.  Today are there any activities that you are unable to do or having difficulty with because of this?  (Patient shown scale and patient rated each activity)  Follow up: When you first came in you had difficulty performing these activities.  Today do you still have difficulty?  Patient-Specific activity scoring scheme (Point to one number):  0 1 2 3 4 5 6 7 8 9  10 Unable                                                                                                          Able to perform To perform                                                                                                    activity at the same Activity         Level as before  Injury or problem Activity Initial (eval): Follow up:  Stepping down 3   2.   Putting socks/shoes on  5   3.   Sleep without hip discomfort 2   4. Driving 0 (hasn't tested it yet)    AVERAGE 2.5      COGNITION: Overall cognitive status: Within functional limits for tasks assessed     SENSATION: "Just a little numbness across the top on R"  EDEMA:  Circumferential: 36.5 cm L; 39 cm R  MUSCLE LENGTH: Hamstrings: did not assess Thomas test: Right appears WFL  POSTURE: rounded shoulders and increased thoracic kyphosis  PALPATION: Tightness in R quad and glute med  LOWER EXTREMITY ROM:  Active ROM Right eval Left eval  Hip flexion    Hip extension    Hip abduction    Hip adduction    Hip internal rotation    Hip external rotation    Knee flexion 95 112  Knee extension -7 0  Ankle dorsiflexion    Ankle plantarflexion    Ankle inversion    Ankle eversion     (Blank rows = not tested)  LOWER EXTREMITY MMT:  MMT Right eval Left eval  Hip flexion 4 5  Hip extension 4 4  Hip abduction 3+ 5  Hip adduction    Hip internal rotation    Hip external rotation    Knee flexion 5 5  Knee extension 5 5  Ankle dorsiflexion 5 5  Ankle plantarflexion    Ankle inversion    Ankle eversion     (Blank rows = not tested)  LOWER EXTREMITY SPECIAL TESTS:  Did not assess  FUNCTIONAL TESTS:  5 times sit to stand: 11.97 sec Functional gait assessment: 23/30  [facility] PT Assessment - 04/25/23 0001       Functional Gait  Assessment   Gait assessed  Yes    Gait Level Surface Walks 20 ft in less than 5.5 sec, no assistive devices, good speed, no evidence for imbalance, normal gait pattern, deviates no more than 6 in outside of the 12 in walkway width.    Change in Gait Speed Able to smoothly change walking speed without loss of balance or gait deviation. Deviate no more than 6 in outside of the 12 in walkway width.    Gait with Horizontal Head Turns Performs head turns smoothly with slight change in gait velocity (eg, minor disruption to smooth gait path), deviates 6-10 in outside 12 in walkway width, or uses an assistive device.    Gait with Vertical Head Turns  Performs head turns with no change in gait. Deviates no more than 6 in outside 12 in walkway width.    Gait and Pivot Turn Pivot turns safely within 3 sec and stops quickly with no loss of balance.    Step Over Obstacle Is able to step over one shoe box (4.5 in total height) without changing gait speed. No evidence of imbalance.    Gait with Narrow Base of Support Ambulates 7-9 steps.   Able to do 10 steps but had stagger so dropped to 2 points instead of 3   Gait with Eyes Closed Walks 20 ft, uses assistive device, slower speed, mild gait deviations, deviates 6-10 in outside 12 in walkway width. Ambulates 20 ft in less than 9 sec but greater than 7 sec.   mild L deviation   Ambulating Backwards Walks 20 ft, uses assistive device, slower speed, mild gait deviations, deviates 6-10 in outside 12 in walkway  width.    Steps Alternating feet, must use rail.    Total Score 24              GAIT: Distance walked: Into clinic Assistive device utilized: None Level of assistance: Complete [place] Comments: Reciprocal pattern, mildly antalgic with knee slightly flexed during stance phase, unable to fully extend knee during swing                                                                                                                                TREATMENT DATE: 04/25/23 See HEP below  Vaso x 10 min, low compression, 34 deg R knee  PATIENT EDUCATION:  Education details: Exam findings, POC, initial HEP, sleep positioning / trial of heat to decrease hip discomfort Person educated: Patient Education method: Explanation, Demonstration, and Handouts Education comprehension: verbalized understanding, returned demonstration, and needs further education  HOME EXERCISE PROGRAM: Access Code: [email or web address] URL: [email or web address] Date: 04/25/2023 Prepared by: Vernon Prey April Kirstie Peri  Exercises - Supine Quadriceps Stretch with Strap on Table  - 1 x daily - 7 x weekly - 2  sets - 30 sec hold - Supine Piriformis Stretch with Foot on Ground  - 1 x daily - 7 x weekly - 2 sets - 30 sec hold - Clamshell with Resistance  - 1 x daily - 7 x weekly - 2 sets - 10 reps - Squat with Chair Touch and Resistance Loop  - 1 x daily - 7 x weekly - 2 sets - 10 reps - Hip Abduction with Resistance Loop  - 1 x daily - 7 x weekly - 2 sets - 10 reps - Hip Extension with Resistance Loop  - 1 x daily - 7 x weekly - 2 sets - 10 reps - Prone Knee Flexion  - 1 x daily - 7 x weekly - 2 sets - 10 reps - Prone Quadriceps Set  - 1 x daily - 7 x weekly - 2 sets - 10 reps  ASSESSMENT:  CLINICAL IMPRESSION: Patient is a 63 y.o. M who was seen today for physical therapy evaluation and treatment s/p R TKA. Assessment significant for reduced LE ROM, edema, decreased strength, and decreased balance affecting his functional mobility, t/fs, and amb for home and community tasks. At this time, pt has more pain and discomfort in his R hip along glute med/piriformis. Added stretches and strengthening to try and address this. Progressed his home health exercises with resistance bands. Pt will greatly benefit from PT to address these issues to maximize his level of function and reach his goals.   OBJECTIVE IMPAIRMENTS: Abnormal gait, decreased activity tolerance, decreased balance, decreased endurance, decreased mobility, difficulty walking, decreased ROM, decreased strength, hypomobility, increased edema, increased fascial restrictions, increased muscle spasms, impaired flexibility, impaired sensation, improper body mechanics, postural dysfunction, and pain.   ACTIVITY LIMITATIONS: carrying, lifting, bending, standing, squatting, sleeping, stairs, transfers, bed mobility, bathing, dressing, locomotion level, and caring for others  PARTICIPATION LIMITATIONS: meal prep, cleaning, driving, shopping, and community activity  PERSONAL FACTORS: Age, Fitness, Past/current experiences, and Time since onset of  injury/illness/exacerbation are also affecting patient's functional outcome.   REHAB POTENTIAL: Good  CLINICAL DECISION MAKING: Evolving/moderate complexity  EVALUATION COMPLEXITY: Moderate   GOALS: Goals reviewed with patient? Yes  SHORT TERM GOALS: Target date: 05/23/2023  Patient will be independent with initial HEP. Baseline: Newly provided Goal status: INITIAL  2.  Pt will have improved R knee ROM from 5 to 105 deg Baseline: 7 to 95 deg Goal status: INITIAL   LONG TERM GOALS: Target date: 06/20/2023  Patient will be independent with advanced/ongoing HEP to improve outcomes and carryover.  Baseline:  Goal status: INITIAL  2.  Patient will report at least 75% improvement in hip and knee pain to improve QOL. Baseline: 2-6 out of 10 R hip pain Goal status: INITIAL  3.  Patient will demonstrate improved R knee AROM to >/= 0-110 deg to allow for normal gait and stair mechanics. Baseline: 7-95 Goal status: INITIAL  4.  Patient will demonstrate improved functional LE strength as demonstrated by ability to perform single leg step downs from 6" step x10 without hand held assist for stairs. Baseline: Has to use bilat rails to maintain, gets pulling/discomfort in front of knee Goal status: INITIAL  5.  Patient will be able to ambulate 1000' with LRAD and normal gait pattern without increased pain to access community.  Baseline: Just started to walk without a/d 04/23/23 around the house Goal status: INITIAL   6.  Patient will have >/=5.5 point average in PSFS to demonstrate MCID for improved functional ability. Baseline: 2.5 Goal status: INITIAL  8.  Patient will demonstrate at least 28/30 on FGA to decrease risk of falls. Baseline: 23/30 Goal status: INITIAL     PLAN:  PT FREQUENCY: 2x/week  PT DURATION: 8 weeks  PLANNED INTERVENTIONS: 97164- PT Re-evaluation, 97110-Therapeutic exercises, 97530- Therapeutic activity, 97112- Neuromuscular re-education, 97535- Self  Care, 86578- Manual therapy, [id number]- Gait training, [phone]- Aquatic Therapy, [phone]- Electrical stimulation (unattended), 97016- Vasopneumatic device, [id number]- Ultrasound, [id number]- Ionotophoresis 4mg /ml Dexamethasone, Patient/Family education, Balance training, Stair training, Taping, Dry Needling, Joint mobilization, Spinal mobilization, Cryotherapy, and Moist heat  PLAN FOR NEXT SESSION: Assess/modify HEP. Progress quad and LE strengthening, knee ROM, gait and balance.   Ruie Sendejo April Ma L Advaith Lamarque, PT 04/25/2023, 2:45 PM

## 2023-04-25 NOTE — Discharge Summary (Signed)
 Physician Discharge Summary      Patient ID: Patrick [name] MRN: [id number] DOB/AGE: 10-29-1960 63 y.o.  Admit date: 04/08/2023 Discharge date: 04/09/2023  Admission Diagnoses:  Principal Problem:   S/P total knee replacement, right Active Problems:   Arthritis of right knee   Discharge Diagnoses:  Same  Surgeries: Procedure(s): RIGHT TOTAL KNEE ARTHROPLASTY on 04/08/2023   Consultants:   Discharged Condition: Stable  Hospital Course: Patrick [name] is an 63 y.o. male who was admitted 04/08/2023 with a chief complaint of right knee pain, and found to have a diagnosis of right knee arthritis.  They were brought to the operating room on 04/08/2023 and underwent the above named procedures.  Pt awoke from anesthesia without complication and was transferred to the floor. On POD1, patient's pain was controlled and he was able to mobilize well with physical therapy.  No red flag signs or symptoms throughout stay.  Discharged home on POD 1..  Pt will f/u with Dr. Addie in clinic in ~2 weeks.   Antibiotics given:  Anti-infectives (From admission, onward)    Start     Dose/Rate Route Frequency Ordered Stop   04/08/23 1449  vancomycin  (VANCOCIN ) powder  Status:  Discontinued          As needed 04/08/23 1449 04/08/23 1659   04/08/23 1400  ceFAZolin  (ANCEF ) IVPB 2g/100 mL premix        2 g 200 mL/hr over 30 Minutes Intravenous Every 8 hours 04/08/23 1239 04/08/23 2218   04/08/23 1345  ceFAZolin  (ANCEF ) IVPB 2g/100 mL premix  Status:  Discontinued        2 g 200 mL/hr over 30 Minutes Intravenous On call to O.R. 04/08/23 1239 04/08/23 1800   04/08/23 1247  ceFAZolin  (ANCEF ) 2-4 GM/100ML-% IVPB       Note to Pharmacy: Patrick [name] A: cabinet override      04/08/23 1247 04/08/23 1425     .  Recent vital signs:  Vitals:   04/09/23 0731 04/09/23 1405  BP: 134/64 128/70  Pulse: 82 91  Resp:    Temp: 98.2 F (36.8 C) 98.3 F (36.8 C)  SpO2: 95% 96%    Recent laboratory studies:  Results for  orders placed or performed during the hospital encounter of 04/02/23  Urinalysis, w/ Reflex to Culture (Infection Suspected) -Urine, Clean Catch   Collection Time: 04/02/23 11:14 AM  Result Value Ref Range   Specimen Source URINE, CLEAN CATCH    Color, Urine YELLOW YELLOW   APPearance CLEAR CLEAR   Specific Gravity, Urine 1.015 1.005 - 1.030   pH 6.0 5.0 - 8.0   Glucose, UA NEGATIVE NEGATIVE mg/dL   Hgb urine dipstick NEGATIVE NEGATIVE   Bilirubin Urine NEGATIVE NEGATIVE   Ketones, ur NEGATIVE NEGATIVE mg/dL   Protein, ur NEGATIVE NEGATIVE mg/dL   Nitrite NEGATIVE NEGATIVE   Leukocytes,Ua NEGATIVE NEGATIVE   RBC / HPF 0-5 0 - 5 RBC/hpf   WBC, UA 0-5 0 - 5 WBC/hpf   Bacteria, UA NONE SEEN NONE SEEN   Squamous Epithelial / HPF 0-5 0 - 5 /HPF  Surgical pcr screen   Collection Time: 04/02/23 11:15 AM   Specimen: Nasal Mucosa; Nasal Swab  Result Value Ref Range   MRSA, PCR NEGATIVE NEGATIVE   Staphylococcus aureus POSITIVE (A) NEGATIVE  CBC   Collection Time: 04/02/23 11:30 AM  Result Value Ref Range   WBC 5.8 4.0 - 10.5 K/uL   RBC 4.96 4.22 - 5.81 MIL/uL   Hemoglobin 15.1 13.0 -  17.0 g/dL   HCT 54.6 60.9 - 47.9 %   MCV 91.3 80.0 - 100.0 fL   MCH 30.4 26.0 - 34.0 pg   MCHC 33.3 30.0 - 36.0 g/dL   RDW 88.0 88.4 - 84.4 %   Platelets 218 150 - 400 K/uL   nRBC 0.0 0.0 - 0.2 %  Basic metabolic panel   Collection Time: 04/02/23 11:30 AM  Result Value Ref Range   Sodium 141 135 - 145 mmol/L   Potassium 4.3 3.5 - 5.1 mmol/L   Chloride 103 98 - 111 mmol/L   CO2 27 22 - 32 mmol/L   Glucose, Bld 100 (H) 70 - 99 mg/dL   BUN 18 8 - 23 mg/dL   Creatinine, Ser 8.95 0.61 - 1.24 mg/dL   Calcium  9.2 8.9 - 10.3 mg/dL   GFR, Estimated >39 >39 mL/min   Anion gap 11 5 - 15    Discharge Medications:   Allergies as of 04/09/2023       Reactions   Penicillins Hives   Rash around neck; 04/08/23 tolerated cefazolin         Medication List     STOP taking these medications    aspirin   EC 81 MG tablet Replaced by: aspirin  81 MG chewable tablet   Krill Oil 500 MG Caps       TAKE these medications    aspirin  81 MG chewable tablet Chew 1 tablet (81 mg total) by mouth 2 (two) times daily. Replaces: aspirin  EC 81 MG tablet   B-12 2000 MCG Tabs Take 2,000 mcg by mouth daily.   cetirizine 10 MG tablet Commonly known as: ZYRTEC Take 10 mg by mouth at bedtime.   cholecalciferol 25 MCG (1000 UNIT) tablet Commonly known as: VITAMIN D3 Take 1,000 Units by mouth daily.   fluticasone  50 MCG/ACT nasal spray Commonly known as: FLONASE  Place 1 spray into both nostrils daily.   gabapentin  100 MG capsule Commonly known as: NEURONTIN  Take 2 capsules (200 mg total) by mouth at bedtime.   Ginger Root 550 MG Caps Take 550 mg by mouth daily.   multivitamin with minerals Tabs tablet Take 1 tablet by mouth daily. Men's Over 50   rosuvastatin  20 MG tablet Commonly known as: CRESTOR  Take 1 tablet (20 mg total) by mouth daily.   Tart Cherry Advanced Caps Take 1 capsule by mouth daily.   telmisartan  80 MG tablet Commonly known as: MICARDIS  Take 1 tablet (80 mg total) by mouth daily.   Turmeric Curcumin 500 MG Caps Take 500 mg by mouth daily.   vitamin C 1000 MG tablet Take 1,000 mg by mouth daily.        Diagnostic Studies: XR Knee 1-2 Views Right Result Date: 04/22/2023 AP and lateral views of the knee reviewed.  Right total knee arthroplasty prosthesis in good position and alignment without any complicating features.  There is no evidence of dislocation, periprosthetic fracture, patella baja/alta   US  Guided Needle Placement - No Linked Charges Result Date: 04/22/2023 Ultrasound imaging demonstrates needle placement deep to the iliotibial band just superficial to the greater trochanter with injection of fluid around the trochanter.  No complication.  There was no significant resistance during administration of the injection and care was taken to avoid injection  into the gluteal tendons.   Disposition: Discharge disposition: 01-Home or Self Care       Discharge Instructions     Call MD / Call 911   Complete by: As directed  If you experience chest pain or shortness of breath, CALL 911 and be transported to the hospital emergency room.  If you develope a fever above 101 F, pus (white drainage) or increased drainage or redness at the wound, or calf pain, call your surgeon's office.   Constipation Prevention   Complete by: As directed    Drink plenty of fluids.  Prune juice may be helpful.  You may use a stool softener, such as Colace (over the counter) 100 mg twice a day.  Use MiraLax (over the counter) for constipation as needed.   Diet - low sodium heart healthy   Complete by: As directed    Discharge instructions   Complete by: As directed    Okay to shower dressing is waterproof CP machine 1 hour 3 times a day try to increase the degrees daily Weightbearing as tolerated and transition from walker to cane when you feel able Follow-up in 2 weeks Take 1 baby aspirin  twice a day in addition to the other medications for pain and muscle relaxation   Increase activity slowly as tolerated   Complete by: As directed    Post-operative opioid taper instructions:   Complete by: As directed    POST-OPERATIVE OPIOID TAPER INSTRUCTIONS: It is important to wean off of your opioid medication as soon as possible. If you do not need pain medication after your surgery it is ok to stop day one. Opioids include: Codeine, Hydrocodone (Norco, Vicodin), Oxycodone (Percocet, oxycontin ) and hydromorphone  amongst others.  Long term and even short term use of opiods can cause: Increased pain response Dependence Constipation Depression Respiratory depression And more.  Withdrawal symptoms can include Flu like symptoms Nausea, vomiting And more Techniques to manage these symptoms Hydrate well Eat regular healthy meals Stay active Use relaxation  techniques(deep breathing, meditating, yoga) Do Not substitute Alcohol to help with tapering If you have been on opioids for less than two weeks and do not have pain than it is ok to stop all together.  Plan to wean off of opioids This plan should start within one week post op of your joint replacement. Maintain the same interval or time between taking each dose and first decrease the dose.  Cut the total daily intake of opioids by one tablet each day Next start to increase the time between doses. The last dose that should be eliminated is the evening dose.           Follow-up Information     Norleen Lynwood ORN, MD Follow up.   Specialties: Internal Medicine, Radiology Contact information: [address] [place] [address] [phone]         [facility] Follow up.   Why: home health services will be provide by [facility], start of care within 48 hours post discharge Contact information: [address] [place] [address] [phone]                  Signed: Herlene [name] 04/25/2023, 11:08 AM

## 2023-04-29 ENCOUNTER — Ambulatory Visit (INDEPENDENT_AMBULATORY_CARE_PROVIDER_SITE_OTHER): Payer: 59 | Admitting: Physical Therapy

## 2023-04-29 ENCOUNTER — Encounter: Payer: Self-pay | Admitting: Physical Therapy

## 2023-04-29 DIAGNOSIS — M25661 Stiffness of right knee, not elsewhere classified: Secondary | ICD-10-CM

## 2023-04-29 DIAGNOSIS — M25561 Pain in right knee: Secondary | ICD-10-CM

## 2023-04-29 DIAGNOSIS — M6281 Muscle weakness (generalized): Secondary | ICD-10-CM

## 2023-04-29 DIAGNOSIS — R2689 Other abnormalities of gait and mobility: Secondary | ICD-10-CM

## 2023-04-29 NOTE — Therapy (Signed)
 OUTPATIENT PHYSICAL THERAPY TREATMENT   Patient Name: Aramis Weil MRN: [id number] DOB:04-18-1960, 63 y.o., male Today's Date: 04/29/2023  END OF SESSION:  PT End of Session - 04/29/23 1427     Visit Number 2    Number of Visits 16    Date for PT Re-Evaluation 06/20/23    Authorization Type [employer]    Authorization - Number of Visits 30    PT Start Time 1427    PT Stop Time 1516    PT Time Calculation (min) 49 min    Activity Tolerance Patient tolerated treatment well    Behavior During Therapy WFL for tasks assessed/performed              Past Medical History:  Diagnosis Date   Allergy    Arthritis    left knee   Dysrhythmia    " [name]"   Heart murmur    History of hiatal hernia    4 years ago per patient- no surgery needed   History of kidney stones    Hx of adenomatous polyp of colon    Hyperlipidemia    Hypertension    Past Surgical History:  Procedure Laterality Date   APPENDECTOMY     Arthroscopic knee surgeries on both knees     CHOLECYSTECTOMY     COLONOSCOPY     NASAL SEPTOPLASTY W/ TURBINOPLASTY Bilateral 04/13/2019   Procedure: NASAL SEPTOPLASTY WITH BILATERAL TURBINATE REDUCTION;  Surgeon: Newman Pies, MD;  Location: [facility];  Service: ENT;  Laterality: Bilateral;   TOTAL KNEE ARTHROPLASTY Left 01/21/2020   Procedure: LEFT TOTAL KNEE ARTHROPLASTY;  Surgeon: Cammy Copa, MD;  Location: [facility] OR;  Service: Orthopedics;  Laterality: Left;   TOTAL KNEE ARTHROPLASTY Right 04/08/2023   Procedure: RIGHT TOTAL KNEE ARTHROPLASTY;  Surgeon: Cammy Copa, MD;  [facility];  Service: Orthopedics;  Laterality: Right;   VASECTOMY     Patient Active Problem List   Diagnosis Date Noted   Arthritis of right knee 04/21/2023   S/P total knee replacement, right 04/08/2023   Ulnar neuropathy at elbow 01/10/2023   Hx of adenomatous polyp of colon    B12 deficiency 09/02/2022   Asthmatic bronchitis  06/07/2022   Back pain 01/18/2022   Calcification of patellar tendon determined by X-ray 05/15/2021   Coronary artery calcification 12/08/2020   Melanocytic nevi of trunk 08/18/2020   Sebaceous cyst 08/18/2020   Seborrheic keratosis 08/18/2020   S/P total knee arthroplasty, 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   Acute bursitis of right shoulder 02/02/2019   Sinusitis 11/06/2018   Penicillin allergy 11/06/2018   Hip flexor tightness 09/29/2018   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   Hyperglycemia 08/02/2018   HLD (hyperlipidemia) 08/02/2018   Vitamin D deficiency 08/02/2018   Encounter for well adult exam with abnormal findings 07/30/2018   HTN (hypertension) 07/30/2018   Tinnitus aurium, 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   Snoring 05/09/2017   Other allergic rhinitis 02/21/2015    PCP: Corwin Levins, MD  REFERRING PROVIDER: Julieanne Cotton, PA-C  REFERRING DIAG: [phone] (ICD-10-CM) - S/P total knee arthroplasty, right  THERAPY DIAG:  Muscle weakness (generalized)  Other abnormalities of gait and mobility  Acute pain of right knee  Stiffness of right knee, not elsewhere classified  Rationale for Evaluation and Treatment: Rehabilitation  ONSET DATE: 04/08/23 R TKA  SUBJECTIVE:   SUBJECTIVE STATEMENT: Knee is doing okay; the Rt hip is  still uncomfortable.  Night time is the worst for pain.   PERTINENT HISTORY: L TKA  PAIN:  Are you having pain? Yes: NPRS scale: 2 currently, 5 or 6 at worst Pain location: R hip Pain description: nagging R lateral/posterior hip Aggravating factors: CPM, standing Relieving factors: Massager helps a little  PAIN:  Are you having pain? Yes: NPRS scale: 0 Pain location: anterior thigh Pain description: Tightness Aggravating factors: Nothing Relieving factors: ice   PRECAUTIONS: None  RED FLAGS: None   WEIGHT BEARING RESTRICTIONS: No  FALLS:  Has patient fallen in last 6 months? 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  DIAGNOSTIC FINDINGS: n/a  PATIENT SURVEYS:  The Patient-Specific Functional Scale  Initial:  I am going to ask you to identify up to 3 important activities that you are unable to do or are having difficulty with as a result of this problem.  Today are there any activities that you are unable to do or having difficulty with because of this?  (Patient shown scale and patient rated each activity)  Follow up: When you first came in you had difficulty performing these activities.  Today do you still have difficulty?  Patient-Specific activity scoring scheme (Point to one number):  0 1 2 3 4 5 6 7 8 9  10 Unable                                                                                                          Able to perform To perform                                                                                                    activity at the same Activity         Level as before  Injury or problem Activity Initial (eval): Follow up:  Stepping down 3   2.   Putting socks/shoes on  5   3.   Sleep without hip discomfort 2   4. Driving 0 (hasn't tested it yet)   AVERAGE 2.5      COGNITION: Overall cognitive status: Within functional limits for tasks assessed     SENSATION: "Just a little numbness across the top on R"  EDEMA:  Circumferential: 36.5 cm L; 39 cm R  MUSCLE  LENGTH: Hamstrings: did not assess Thomas test: Right appears WFL  POSTURE: rounded shoulders and increased thoracic kyphosis  PALPATION: Tightness in R quad and glute med  LOWER EXTREMITY ROM:  Active ROM Right eval Left eval Right 04/29/23  Knee flexion 95 112 A: 105  Knee extension -7 0 A: -3 (seated LAQ)   (Blank rows = not tested)  LOWER EXTREMITY MMT:  MMT Right eval Left eval  Hip flexion 4 5  Hip extension 4 4  Hip abduction 3+ 5  Knee flexion 5 5  Knee extension 5 5  Ankle dorsiflexion 5 5   (Blank rows = not tested)   FUNCTIONAL TESTS:  5 times sit to stand: 11.97 sec Functional gait assessment: 23/30     GAIT: Distance walked: Into clinic Assistive device utilized: None Level of assistance: Complete Independence Comments: Reciprocal pattern, mildly antalgic with knee slightly flexed during stance phase, unable to fully extend knee during swing                                                                                                                                TREATMENT DATE:  04/29/23 TherEx SciFit bike L4 x 8 min; seat 11 Rt LAQ 4# 3x10; 5 sec hold ROM measurements - see above for details  TherAct Leg Press bil 100# 3x10; RLE only 50# 3x10  Neuro Re-ed Seated SLR on Rt 2x10; cues for quad activation; 5 sec hold with mod cues  Modalities Vaso Rt knee x 10 min; mod pressure 34 deg in elevation   04/25/23 See HEP below  Vaso x 10 min, low compression, 34 deg R knee  PATIENT EDUCATION:  Education details: Exam findings, POC, initial HEP, sleep positioning / trial of heat to decrease hip discomfort Person educated: Patient Education method: Explanation, Demonstration, and Handouts Education comprehension: verbalized understanding, returned demonstration, and needs further education  HOME EXERCISE PROGRAM: Access Code: [email or web address] URL: [email or web address] Date: 04/25/2023 Prepared by: Vernon Prey April Kirstie Peri  Exercises - Supine Quadriceps Stretch with Strap on Table  - 1 x daily - 7 x weekly - 2 sets - 30 sec hold - Supine Piriformis Stretch with Foot on Ground  - 1 x daily - 7 x weekly - 2 sets - 30 sec hold - Clamshell with Resistance  - 1 x daily - 7 x weekly - 2 sets - 10 reps - Squat with Chair Touch and Resistance Loop  -  1 x daily - 7 x weekly - 2 sets - 10 reps - Hip Abduction with Resistance Loop  - 1 x daily - 7 x weekly - 2 sets - 10 reps - Hip Extension with Resistance Loop  - 1 x daily - 7 x weekly - 2 sets - 10 reps - Prone Knee Flexion  - 1 x daily - 7 x weekly - 2 sets - 10 reps - Prone Quadriceps Set  - 1 x daily - 7 x weekly - 2 sets - 10 reps  ASSESSMENT:  CLINICAL IMPRESSION: Pt tolerated session well today with good improvements noted in AROM today.  Overall progressing well with PT and will continue to benefit from PT to maximize function.  OBJECTIVE IMPAIRMENTS: Abnormal gait, decreased activity tolerance, decreased balance, decreased endurance, decreased mobility, difficulty walking, decreased ROM, decreased strength, hypomobility, increased edema, increased fascial restrictions, increased muscle spasms, impaired flexibility, impaired sensation, improper body mechanics, postural dysfunction, and pain.   ACTIVITY LIMITATIONS: carrying, lifting, bending, standing, squatting, sleeping, stairs, transfers, bed mobility, bathing, dressing, locomotion level, and caring for others  PARTICIPATION LIMITATIONS: meal prep, cleaning, driving, shopping, and community activity  PERSONAL FACTORS: Age, Fitness, Past/current experiences, and Time since onset of injury/illness/exacerbation are also affecting patient's functional outcome.   REHAB POTENTIAL: Good  CLINICAL DECISION MAKING: Evolving/moderate complexity  EVALUATION COMPLEXITY: Moderate   GOALS: Goals reviewed with patient? Yes  SHORT TERM GOALS: Target date: 05/23/2023  Patient will be independent with initial  HEP. Baseline: Newly provided Goal status: INITIAL  2.  Pt will have improved R knee ROM from 5 to 105 deg Baseline: 7 to 95 deg Goal status: INITIAL   LONG TERM GOALS: Target date: 06/20/2023  Patient will be independent with advanced/ongoing HEP to improve outcomes and carryover.  Baseline:  Goal status: INITIAL  2.  Patient will report at least 75% improvement in hip and knee pain to improve QOL. Baseline: 2-6 out of 10 R hip pain Goal status: INITIAL  3.  Patient will demonstrate improved R knee AROM to >/= 0-110 deg to allow for normal gait and stair mechanics. Baseline: 7-95 Goal status: INITIAL  4.  Patient will demonstrate improved functional LE strength as demonstrated by ability to perform single leg step downs from 6" step x10 without hand held assist for stairs. Baseline: Has to use bilat rails to maintain, gets pulling/discomfort in front of knee Goal status: INITIAL  5.  Patient will be able to ambulate 1000' with LRAD and normal gait pattern without increased pain to access community.  Baseline: Just started to walk without a/d 04/23/23 around the house Goal status: INITIAL   6.  Patient will have >/=5.5 point average in PSFS to demonstrate MCID for improved functional ability. Baseline: 2.5 Goal status: INITIAL  8.  Patient will demonstrate at least 28/30 on FGA to decrease risk of falls. Baseline: 23/30 Goal status: INITIAL     PLAN:  PT FREQUENCY: 2x/week  PT DURATION: 8 weeks  PLANNED INTERVENTIONS: 97164- PT Re-evaluation, 97110-Therapeutic exercises, 97530- Therapeutic activity, 97112- Neuromuscular re-education, 97535- Self Care, 65784- Manual therapy, [id number]- Gait training, [id number]- Aquatic Therapy, [id number]- Electrical stimulation (unattended), 97016- Vasopneumatic device, [id number]- Ultrasound, 32440- Ionotophoresis 4mg /ml Dexamethasone, Patient/Family education, Balance training, Stair training, Taping, Dry Needling, Joint mobilization, Spinal  mobilization, Cryotherapy, and Moist heat  PLAN FOR NEXT SESSION:  Assess/modify HEP PRN. Progress quad and LE strengthening, knee ROM, gait and balance.   Clarita Crane, PT, DPT 04/29/23 3:10 PM

## 2023-04-30 ENCOUNTER — Other Ambulatory Visit: Payer: Self-pay | Admitting: Orthopedic Surgery

## 2023-04-30 MED ORDER — OXYCODONE HCL 5 MG PO TABS: 5.0000 mg | ORAL_TABLET | Freq: Two times a day (BID) | ORAL | 0 refills | Status: DC | PRN

## 2023-04-30 MED ORDER — DOCUSATE SODIUM 100 MG PO CAPS: 100.0000 mg | ORAL_CAPSULE | Freq: Two times a day (BID) | ORAL | 0 refills | Status: DC

## 2023-04-30 MED ORDER — METHOCARBAMOL 500 MG PO TABS: 500.0000 mg | ORAL_TABLET | Freq: Four times a day (QID) | ORAL | 0 refills | Status: DC | PRN

## 2023-05-01 NOTE — Telephone Encounter (Signed)
 Hey lauren can you add [name] to schedule for Friday for hip intraarticular injection at 12:45?

## 2023-05-01 NOTE — Telephone Encounter (Signed)
 Patient is scheduled for Friday at 12:45

## 2023-05-02 ENCOUNTER — Ambulatory Visit (INDEPENDENT_AMBULATORY_CARE_PROVIDER_SITE_OTHER): Payer: 59 | Admitting: Physical Therapy

## 2023-05-02 ENCOUNTER — Encounter: Payer: Self-pay | Admitting: Physical Therapy

## 2023-05-02 DIAGNOSIS — M6281 Muscle weakness (generalized): Secondary | ICD-10-CM | POA: Diagnosis not present

## 2023-05-02 DIAGNOSIS — R2689 Other abnormalities of gait and mobility: Secondary | ICD-10-CM

## 2023-05-02 DIAGNOSIS — M25661 Stiffness of right knee, not elsewhere classified: Secondary | ICD-10-CM

## 2023-05-02 DIAGNOSIS — M25561 Pain in right knee: Secondary | ICD-10-CM | POA: Diagnosis not present

## 2023-05-02 NOTE — Therapy (Signed)
 OUTPATIENT PHYSICAL THERAPY TREATMENT   Patient Name: Jairus Tonne MRN: [id number] DOB:10/07/1960, 63 y.o., male Today's Date: 05/02/2023  END OF SESSION:  PT End of Session - 05/02/23 0926     Visit Number 3    Number of Visits 16    Date for PT Re-Evaluation 06/20/23    Authorization Type [employer]    Authorization - Number of Visits 30    PT Start Time [phone]    PT Stop Time 1010    PT Time Calculation (min) 48 min    Activity Tolerance Patient tolerated treatment well    Behavior During Therapy WFL for tasks assessed/performed               Past Medical History:  Diagnosis Date   Allergy    Arthritis    left knee   Dysrhythmia    " [name]"   Heart murmur    History of hiatal hernia    4 years ago per patient- no surgery needed   History of kidney stones    Hx of adenomatous polyp of colon    Hyperlipidemia    Hypertension    Past Surgical History:  Procedure Laterality Date   APPENDECTOMY     Arthroscopic knee surgeries on both knees     CHOLECYSTECTOMY     COLONOSCOPY     NASAL SEPTOPLASTY W/ TURBINOPLASTY Bilateral 04/13/2019   Procedure: NASAL SEPTOPLASTY WITH BILATERAL TURBINATE REDUCTION;  Surgeon: Newman Pies, MD;  Location: [facility];  Service: ENT;  Laterality: Bilateral;   TOTAL KNEE ARTHROPLASTY Left 01/21/2020   Procedure: LEFT TOTAL KNEE ARTHROPLASTY;  Surgeon: Cammy Copa, MD;  Location: [facility] OR;  Service: Orthopedics;  Laterality: Left;   TOTAL KNEE ARTHROPLASTY Right 04/08/2023   Procedure: RIGHT TOTAL KNEE ARTHROPLASTY;  Surgeon: Cammy Copa, MD;  Location: [facility] OR;  Service: Orthopedics;  Laterality: Right;   VASECTOMY     Patient Active Problem List   Diagnosis Date Noted   Arthritis of right knee 04/21/2023   S/P total knee replacement, right 04/08/2023   Ulnar neuropathy at elbow 01/10/2023   Hx of adenomatous polyp of colon    B12 deficiency 09/02/2022   Asthmatic  bronchitis 06/07/2022   Back pain 01/18/2022   Calcification of patellar tendon determined by X-ray 05/15/2021   Coronary artery calcification 12/08/2020   Melanocytic nevi of trunk 08/18/2020   Sebaceous cyst 08/18/2020   Seborrheic keratosis 08/18/2020   S/P total knee arthroplasty, 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   Acute bursitis of right shoulder 02/02/2019   Sinusitis 11/06/2018   Penicillin allergy 11/06/2018   Hip flexor tightness 09/29/2018   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   Hyperglycemia 08/02/2018   HLD (hyperlipidemia) 08/02/2018   Vitamin D deficiency 08/02/2018   Encounter for well adult exam with abnormal findings 07/30/2018   HTN (hypertension) 07/30/2018   Tinnitus aurium, 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   Snoring 05/09/2017   Other allergic rhinitis 02/21/2015    PCP: Corwin Levins, MD  REFERRING PROVIDER: Julieanne Cotton, PA-C  REFERRING DIAG: [phone] (ICD-10-CM) - S/P total knee arthroplasty, right  THERAPY DIAG:  Muscle weakness (generalized)  Other abnormalities of gait and mobility  Acute pain of right knee  Stiffness of right knee, not elsewhere classified  Rationale for Evaluation and Treatment: Rehabilitation  ONSET DATE: 04/08/23 R TKA  SUBJECTIVE:   SUBJECTIVE STATEMENT: Hip is still painful; getting injection tomorrow  PERTINENT HISTORY: L TKA  PAIN:  Are you having pain? Yes: NPRS scale: 2 currently, 5 or 6 at worst Pain location: R hip Pain description: nagging R lateral/posterior hip Aggravating factors: CPM, standing Relieving factors: Massager helps a little  PAIN:  Are you having pain? Yes: NPRS scale: 0 Pain location: anterior thigh Pain description: Tightness Aggravating factors: Nothing Relieving factors: ice   PRECAUTIONS: None  RED FLAGS: None   WEIGHT BEARING RESTRICTIONS: No  FALLS:  Has patient fallen in last 6 months? No  LIVING ENVIRONMENT: Lives with: lives with their  spouse Lives in: House/apartment Stairs: Yes: Internal: 18 steps; can reach both and External: 4 steps; on left going up Has following equipment at home: None  OCCUPATION: Retired -- Works part time for [employer] during baseball season (April)  PLOF: Independent  PATIENT GOALS: Return to normal activities without pain  NEXT MD VISIT: 05/20/23  OBJECTIVE:  Note: Objective measures were completed at Evaluation unless otherwise noted.  DIAGNOSTIC FINDINGS: n/a  PATIENT SURVEYS:  The Patient-Specific Functional Scale  Initial:  I am going to ask you to identify up to 3 important activities that you are unable to do or are having difficulty with as a result of this problem.  Today are there any activities that you are unable to do or having difficulty with because of this?  (Patient shown scale and patient rated each activity)  Follow up: When you first came in you had difficulty performing these activities.  Today do you still have difficulty?  Patient-Specific activity scoring scheme (Point to one number):  0 1 2 3 4 5 6 7 8 9  10 Unable                                                                                                          Able to perform To perform                                                                                                    activity at the same Activity         Level as before  Injury or problem Activity Initial (eval): Follow up:  Stepping down 3   2.   Putting socks/shoes on  5   3.   Sleep without hip discomfort 2   4. Driving 0 (hasn't tested it yet)   AVERAGE 2.5      COGNITION: Overall cognitive status: Within functional limits for tasks assessed     SENSATION: "Just a little numbness across the top on R"  EDEMA:  Circumferential: 36.5 cm L; 39 cm R  MUSCLE LENGTH: Hamstrings: did not assess Thomas test:  Right appears WFL  POSTURE: rounded shoulders and increased thoracic kyphosis  PALPATION: Tightness in R quad and glute med  LOWER EXTREMITY ROM:  Active ROM Right eval Left eval Right 04/29/23  Knee flexion 95 112 A: 105  Knee extension -7 0 A: -3 (seated LAQ)   (Blank rows = not tested)  LOWER EXTREMITY MMT:  MMT Right eval Left eval  Hip flexion 4 5  Hip extension 4 4  Hip abduction 3+ 5  Knee flexion 5 5  Knee extension 5 5  Ankle dorsiflexion 5 5   (Blank rows = not tested)   FUNCTIONAL TESTS:  5 times sit to stand: 11.97 sec Functional gait assessment: 23/30     GAIT: Distance walked: Into clinic Assistive device utilized: None Level of assistance: Complete Independence Comments: Reciprocal pattern, mildly antalgic with knee slightly flexed during stance phase, unable to fully extend knee during swing                                                                                                                                TREATMENT DATE:  05/02/23 TherEx Recumbent bike partial revolutions x 8 min; seat 8 Rt LAQ 4# with 5 sec hold; 3x10 AA heel slides x 10 reps on Rt  TherAct Leg Press bil 125# 3x10; RLE only 56# 3x10 RLE on 4" step with Lt heel taps 2x10 Sit to/from stand 2x10  Modalities Vaso Rt knee x 10 min; mod pressure 34 deg in elevation   04/29/23 TherEx SciFit bike L4 x 8 min; seat 11 Rt LAQ 4# 3x10; 5 sec hold ROM measurements - see above for details  TherAct Leg Press bil 100# 3x10; RLE only 50# 3x10  Neuro Re-ed Seated SLR on Rt 2x10; cues for quad activation; 5 sec hold with mod cues  Modalities Vaso Rt knee x 10 min; mod pressure 34 deg in elevation   04/25/23 See HEP below  Vaso x 10 min, low compression, 34 deg R knee  PATIENT EDUCATION:  Education details: Exam findings, POC, initial HEP, sleep positioning / trial of heat to decrease hip discomfort Person educated: Patient Education method: Explanation,  Demonstration, and Handouts Education comprehension: verbalized understanding, returned demonstration, and needs further education  HOME EXERCISE PROGRAM: Access Code: [email or web address] URL: [email or web address] Date: 04/25/2023 Prepared by: Vernon Prey April Kirstie Peri  Exercises - Supine Quadriceps Stretch with Strap on Table  -  1 x daily - 7 x weekly - 2 sets - 30 sec hold - Supine Piriformis Stretch with Foot on Ground  - 1 x daily - 7 x weekly - 2 sets - 30 sec hold - Clamshell with Resistance  - 1 x daily - 7 x weekly - 2 sets - 10 reps - Squat with Chair Touch and Resistance Loop  - 1 x daily - 7 x weekly - 2 sets - 10 reps - Hip Abduction with Resistance Loop  - 1 x daily - 7 x weekly - 2 sets - 10 reps - Hip Extension with Resistance Loop  - 1 x daily - 7 x weekly - 2 sets - 10 reps - Prone Knee Flexion  - 1 x daily - 7 x weekly - 2 sets - 10 reps - Prone Quadriceps Set  - 1 x daily - 7 x weekly - 2 sets - 10 reps  ASSESSMENT:  CLINICAL IMPRESSION: Pt continues to demonstrate good functional improvement as well as continued improvement in Rt knee flexion.  Still having hip pain and getting injection tomorrow.  Will continue to benefit from PT to maximize function.  OBJECTIVE IMPAIRMENTS: Abnormal gait, decreased activity tolerance, decreased balance, decreased endurance, decreased mobility, difficulty walking, decreased ROM, decreased strength, hypomobility, increased edema, increased fascial restrictions, increased muscle spasms, impaired flexibility, impaired sensation, improper body mechanics, postural dysfunction, and pain.   ACTIVITY LIMITATIONS: carrying, lifting, bending, standing, squatting, sleeping, stairs, transfers, bed mobility, bathing, dressing, locomotion level, and caring for others  PARTICIPATION LIMITATIONS: meal prep, cleaning, driving, shopping, and community activity  PERSONAL FACTORS: Age, Fitness, Past/current experiences, and Time since onset of  injury/illness/exacerbation are also affecting patient's functional outcome.   REHAB POTENTIAL: Good  CLINICAL DECISION MAKING: Evolving/moderate complexity  EVALUATION COMPLEXITY: Moderate   GOALS: Goals reviewed with patient? Yes  SHORT TERM GOALS: Target date: 05/23/2023  Patient will be independent with initial HEP. Baseline: Newly provided Goal status: INITIAL  2.  Pt will have improved R knee ROM from 5 to 105 deg Baseline: 7 to 95 deg Goal status: INITIAL   LONG TERM GOALS: Target date: 06/20/2023  Patient will be independent with advanced/ongoing HEP to improve outcomes and carryover.  Baseline:  Goal status: INITIAL  2.  Patient will report at least 75% improvement in hip and knee pain to improve QOL. Baseline: 2-6 out of 10 R hip pain Goal status: INITIAL  3.  Patient will demonstrate improved R knee AROM to >/= 0-110 deg to allow for normal gait and stair mechanics. Baseline: 7-95 Goal status: INITIAL  4.  Patient will demonstrate improved functional LE strength as demonstrated by ability to perform single leg step downs from 6" step x10 without hand held assist for stairs. Baseline: Has to use bilat rails to maintain, gets pulling/discomfort in front of knee Goal status: INITIAL  5.  Patient will be able to ambulate 1000' with LRAD and normal gait pattern without increased pain to access community.  Baseline: Just started to walk without a/d 04/23/23 around the house Goal status: INITIAL   6.  Patient will have >/=5.5 point average in PSFS to demonstrate MCID for improved functional ability. Baseline: 2.5 Goal status: INITIAL  8.  Patient will demonstrate at least 28/30 on FGA to decrease risk of falls. Baseline: 23/30 Goal status: INITIAL     PLAN:  PT FREQUENCY: 2x/week  PT DURATION: 8 weeks  PLANNED INTERVENTIONS: 97164- PT Re-evaluation, 97110-Therapeutic exercises, 97530- Therapeutic activity, 97112- Neuromuscular re-education,  96045- Self  Care, 40981- Manual therapy, [id number]- Gait training, [phone]- Aquatic Therapy, [phone]- Electrical stimulation (unattended), 97016- Vasopneumatic device, [id number]- Ultrasound, [id number]- Ionotophoresis 4mg /ml Dexamethasone, Patient/Family education, Balance training, Stair training, Taping, Dry Needling, Joint mobilization, Spinal mobilization, Cryotherapy, and Moist heat  PLAN FOR NEXT SESSION:  how is hip? Assess/modify HEP PRN. Progress quad and LE strengthening, knee ROM, gait and balance.   Clarita Crane, PT, DPT 05/02/23 11:07 AM

## 2023-05-03 ENCOUNTER — Encounter: Payer: Self-pay | Admitting: Surgical

## 2023-05-03 ENCOUNTER — Other Ambulatory Visit: Payer: Self-pay

## 2023-05-03 ENCOUNTER — Ambulatory Visit (INDEPENDENT_AMBULATORY_CARE_PROVIDER_SITE_OTHER): Payer: 59 | Admitting: Surgical

## 2023-05-03 DIAGNOSIS — M65951 Unspecified synovitis and tenosynovitis, right thigh: Secondary | ICD-10-CM | POA: Diagnosis not present

## 2023-05-03 DIAGNOSIS — M25551 Pain in right hip: Secondary | ICD-10-CM

## 2023-05-03 MED ORDER — METHYLPREDNISOLONE ACETATE 40 MG/ML IJ SUSP
40.0000 mg | INTRAMUSCULAR | Status: AC | PRN
Start: 2023-05-03 — End: 2023-05-03
  Administered 2023-05-03: 40 mg via INTRA_ARTICULAR

## 2023-05-03 MED ORDER — BUPIVACAINE HCL 0.25 % IJ SOLN
7.0000 mL | INTRAMUSCULAR | Status: AC | PRN
  Administered 2023-05-03: 7 mL via INTRA_ARTICULAR

## 2023-05-03 MED ORDER — LIDOCAINE HCL 1 % IJ SOLN
5.0000 mL | INTRAMUSCULAR | Status: AC | PRN
  Administered 2023-05-03: 5 mL

## 2023-05-03 NOTE — Progress Notes (Signed)
   Procedure Note  Patient: Patrick [name]             Date of Birth: August 05, 1960           MRN: [id number]             Visit Date: 05/03/2023  Procedures: Visit Diagnoses:  1. Synovitis of right hip   2. Pain in right hip     Large Joint Inj: R hip joint on 05/03/2023 12:46 PM Indications: pain and diagnostic evaluation Details: 22 G 3.5 in needle, ultrasound-guided anterior approach Medications: 5 mL lidocaine 1 %; 7 mL bupivacaine 0.25 %; 40 mg methylPREDNISolone acetate 40 MG/ML Outcome: tolerated well, no immediate complications  Patient returns for right hip intra-articular injection as we discussed via MyChart and at last visit.  He had right greater trochanteric injection about a week ago that gave him really no significant relief despite a fair amount of tenderness over the trochanter.  He has continued fairly global hip pain both in the buttock and the groin without radiation and without any low back pain.  He had similar pain after his left knee replacement in the left hip that did respond excellent to cortisone injection into the left hip at that time despite having no reproduction of pain with hip range of motion at that time just like he does with his right hip today.  Injection administered and patient tolerated procedure well without complication.  He will let us know via MyChart how this procedure does for him early next week.  Next step would be either MRI of the pelvis or MRI of the lumbar spine for further evaluation of this hip pain. Procedure, treatment alternatives, risks and benefits explained, specific risks discussed. Consent was given by the patient. Immediately prior to procedure a time out was called to verify the correct patient, procedure, equipment, support staff and site/side marked as required. Patient was prepped and draped in the usual sterile fashion.

## 2023-05-05 ENCOUNTER — Other Ambulatory Visit: Payer: Self-pay | Admitting: Orthopedic Surgery

## 2023-05-08 ENCOUNTER — Ambulatory Visit (INDEPENDENT_AMBULATORY_CARE_PROVIDER_SITE_OTHER): Payer: 59 | Admitting: Physical Therapy

## 2023-05-08 ENCOUNTER — Encounter: Payer: Self-pay | Admitting: Physical Therapy

## 2023-05-08 DIAGNOSIS — M6281 Muscle weakness (generalized): Secondary | ICD-10-CM | POA: Diagnosis not present

## 2023-05-08 DIAGNOSIS — R2689 Other abnormalities of gait and mobility: Secondary | ICD-10-CM

## 2023-05-08 DIAGNOSIS — M25561 Pain in right knee: Secondary | ICD-10-CM | POA: Diagnosis not present

## 2023-05-08 DIAGNOSIS — M25661 Stiffness of right knee, not elsewhere classified: Secondary | ICD-10-CM | POA: Diagnosis not present

## 2023-05-08 NOTE — Therapy (Signed)
 OUTPATIENT PHYSICAL THERAPY TREATMENT   Patient Name: Patrick [name] MRN: [id number] DOB:01-24-1961, 63 y.o., male Today's Date: 05/08/2023  END OF SESSION:  PT End of Session - 05/08/23 1009     Visit Number 4    Number of Visits 16    Date for PT Re-Evaluation 06/20/23    Authorization Type [employer]    Authorization Time Period no copay    Authorization - Number of Visits 30    PT Start Time 1009    PT Stop Time 1100    PT Time Calculation (min) 51 min    Activity Tolerance Patient tolerated treatment well    Behavior During Therapy WFL for tasks assessed/performed                Past Medical History:  Diagnosis Date   Allergy    Arthritis    left knee   Dysrhythmia    " [name]"   Heart murmur    History of hiatal hernia    4 years ago per patient- no surgery needed   History of kidney stones    Hx of adenomatous polyp of colon    Hyperlipidemia    Hypertension    Past Surgical History:  Procedure Laterality Date   APPENDECTOMY     Arthroscopic knee surgeries on both knees     CHOLECYSTECTOMY     COLONOSCOPY     NASAL SEPTOPLASTY W/ TURBINOPLASTY Bilateral 04/13/2019   Procedure: NASAL SEPTOPLASTY WITH BILATERAL TURBINATE REDUCTION;  Surgeon: Newman Pies, MD;  Location: [facility];  Service: ENT;  Laterality: Bilateral;   TOTAL KNEE ARTHROPLASTY Left 01/21/2020   Procedure: LEFT TOTAL KNEE ARTHROPLASTY;  Surgeon: Cammy Copa, MD;  Location: [facility] OR;  Service: Orthopedics;  Laterality: Left;   TOTAL KNEE ARTHROPLASTY Right 04/08/2023   Procedure: RIGHT TOTAL KNEE ARTHROPLASTY;  Surgeon: Cammy Copa, MD;  Location: [facility] OR;  Service: Orthopedics;  Laterality: Right;   VASECTOMY     Patient Active Problem List   Diagnosis Date Noted   Arthritis of right knee 04/21/2023   S/P total knee replacement, right 04/08/2023   Ulnar neuropathy at elbow 01/10/2023   Hx of adenomatous polyp of colon    B12  deficiency 09/02/2022   Asthmatic bronchitis 06/07/2022   Back pain 01/18/2022   Calcification of patellar tendon determined by X-ray 05/15/2021   Coronary artery calcification 12/08/2020   Melanocytic nevi of trunk 08/18/2020   Sebaceous cyst 08/18/2020   Seborrheic keratosis 08/18/2020   S/P total knee arthroplasty, 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   Acute bursitis of right shoulder 02/02/2019   Sinusitis 11/06/2018   Penicillin allergy 11/06/2018   Hip flexor tightness 09/29/2018   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   Hyperglycemia 08/02/2018   HLD (hyperlipidemia) 08/02/2018   Vitamin D deficiency 08/02/2018   Encounter for well adult exam with abnormal findings 07/30/2018   HTN (hypertension) 07/30/2018   Tinnitus aurium, 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   Snoring 05/09/2017   Other allergic rhinitis 02/21/2015    PCP: Corwin Levins, MD  REFERRING PROVIDER: Julieanne Cotton, PA-C  REFERRING DIAG: [phone] (ICD-10-CM) - S/P total knee arthroplasty, right  THERAPY DIAG:  Muscle weakness (generalized)  Other abnormalities of gait and mobility  Acute pain of right knee  Stiffness of right knee, not elsewhere classified  Rationale for Evaluation and Treatment: Rehabilitation  ONSET DATE: 04/08/23 R TKA  SUBJECTIVE:  SUBJECTIVE STATEMENT: He had methylPREDNISolone acetate injection to right intra-articular hip on 05/03/2023. On 04/22/2023 he had injection to right trochanteric bursa with no relief for hip pain. It did not seem to help. They are going to start imaging to rule out back issues causing hip pain.  PERTINENT HISTORY: L TKA  PAIN:  Are you having pain? Yes: NPRS scale:  1-2/10 currently, since last PT appt 5 or 6 at worst (more at night)  Pain location: R hip Pain description: nagging R lateral/posterior hip Aggravating factors: CPM, standing Relieving factors: Massager helps a little  PAIN:  Are you having pain? Yes: NPRS scale:    0 more stiffness Pain location: right knee Pain description: Tightness Aggravating factors: Nothing Relieving factors: ice  PRECAUTIONS: None  RED FLAGS: None   WEIGHT BEARING RESTRICTIONS: No  FALLS:  Has patient fallen in last 6 months? No  LIVING ENVIRONMENT: Lives with: lives with their spouse Lives in: House/apartment Stairs: Yes: Internal: 18 steps; can reach both and External: 4 steps; on left going up Has following equipment at home: None  OCCUPATION: Retired -- Works part time for [employer] during baseball season (April)  PLOF: Independent  PATIENT GOALS: Return to normal activities without pain  NEXT MD VISIT: 05/20/23  OBJECTIVE:  Note: Objective measures were completed at Evaluation unless otherwise noted.  DIAGNOSTIC FINDINGS: n/a  PATIENT SURVEYS:  The Patient-Specific Functional Scale  Initial:  I am going to ask you to identify up to 3 important activities that you are unable to do or are having difficulty with as a result of this problem.  Today are there any activities that you are unable to do or having difficulty with because of this?  (Patient shown scale and patient rated each activity)  Follow up: When you first came in you had difficulty performing these activities.  Today do you still have difficulty?  Patient-Specific activity scoring scheme (Point to one number):  0 1 2 3 4 5 6 7 8 9  10 Unable                                                                                                          Able to perform To perform                                                                                                    activity at the same Activity         Level as before  Injury or problem Activity Initial (eval): Follow up:  Stepping down 3   2.   Putting socks/shoes on  5   3.   Sleep without hip discomfort 2   4.  Driving 0 (hasn't tested it yet)   AVERAGE 2.5     COGNITION: Overall cognitive status: Within functional limits for tasks assessed    SENSATION: "Just a little numbness across the top on R"  EDEMA:  Circumferential: 36.5 cm L; 39 cm R  MUSCLE LENGTH: Hamstrings: did not assess Thomas test: Right appears WFL  POSTURE: rounded shoulders and increased thoracic kyphosis  PALPATION: Tightness in R quad and glute med  LOWER EXTREMITY ROM:  Active ROM Right eval Left eval Right 04/29/23  Knee flexion 95 112 A: 105  Knee extension -7 0 A: -3 (seated LAQ)   (Blank rows = not tested)  LOWER EXTREMITY MMT:  MMT Right eval Left eval  Hip flexion 4 5  Hip extension 4 4  Hip abduction 3+ 5  Knee flexion 5 5  Knee extension 5 5  Ankle dorsiflexion 5 5   (Blank rows = not tested)   FUNCTIONAL TESTS:  5 times sit to stand: 11.97 sec Functional gait assessment: 23/30     GAIT: Distance walked: Into clinic Assistive device utilized: None Level of assistance: Complete Independence Comments: Reciprocal pattern, mildly antalgic with knee slightly flexed during stance phase, unable to fully extend knee during swing                                                                                                                                TREATMENT DATE:  05/08/2023: Therapeutic Exercise: Recumbent bike seat 8 with flexion stretch for 1 min, then level 1 full revolutions 9 min with no noted hip compensation. Hamstring stretch strap SLR 30 sec hold 3 reps during rest of leg press. Quad stretch hooklying with strap knee flexion with PT cues on position to limit hip rotation 30 sec hold 3 reps. Hamstring curl green Tband 10 reps controlled eccentric motion Bridge with verbal / tactile cues for level pelvis 10 reps LAQ 5# 10 reps  Therapeutic Activities: PT verbal & demo education on positioning in sidelying including "tenting" sheets off feet. PT discussed new shoes with  assessment for best design for him.  His shoes are >72 years old which could be a contributing factor. Pt verbalized understanding.  Leg press BLEs 125# 10 reps 2 sets;  RLE only 62# 10 reps 2 sets. PT cued on proper motion without hip rotation.     05/02/23 TherEx Recumbent bike partial revolutions x 8 min; seat 8 Rt LAQ 4# with 5 sec hold; 3x10 AA heel slides x 10 reps on Rt  TherAct Leg Press bil 125# 3x10; RLE only 56# 3x10 RLE on 4" step with Lt heel taps 2x10 Sit to/from stand 2x10  Modalities Vaso Rt knee x 10 min; mod pressure 34 deg in  elevation   04/29/23 TherEx SciFit bike L4 x 8 min; seat 11 Rt LAQ 4# 3x10; 5 sec hold ROM measurements - see above for details  TherAct Leg Press bil 100# 3x10; RLE only 50# 3x10  Neuro Re-ed Seated SLR on Rt 2x10; cues for quad activation; 5 sec hold with mod cues  Modalities Vaso Rt knee x 10 min; mod pressure 34 deg in elevation   PATIENT EDUCATION:  Education details: Exam findings, POC, initial HEP, sleep positioning / trial of heat to decrease hip discomfort Person educated: Patient Education method: Explanation, Demonstration, and Handouts Education comprehension: verbalized understanding, returned demonstration, and needs further education  HOME EXERCISE PROGRAM: Access Code: [email or web address] URL: [email or web address] Date: 04/25/2023 Prepared by: Vernon Prey April Kirstie Peri  Exercises - Supine Quadriceps Stretch with Strap on Table  - 1 x daily - 7 x weekly - 2 sets - 30 sec hold - Supine Piriformis Stretch with Foot on Ground  - 1 x daily - 7 x weekly - 2 sets - 30 sec hold - Clamshell with Resistance  - 1 x daily - 7 x weekly - 2 sets - 10 reps - Squat with Chair Touch and Resistance Loop  - 1 x daily - 7 x weekly - 2 sets - 10 reps - Hip Abduction with Resistance Loop  - 1 x daily - 7 x weekly - 2 sets - 10 reps - Hip Extension with Resistance Loop  - 1 x daily - 7 x weekly - 2 sets - 10 reps - Prone Knee  Flexion  - 1 x daily - 7 x weekly - 2 sets - 10 reps - Prone Quadriceps Set  - 1 x daily - 7 x weekly - 2 sets - 10 reps  ASSESSMENT:  CLINICAL IMPRESSION: PT focused on knee exercises and activities with limited to no hip substitution.  He improved with PT instruction and verbalized how to monitor / perform with HEP.  PT also educated on shoes and positioning which may be a factor in his hip pain.  Will continue to benefit from PT to maximize function.  OBJECTIVE IMPAIRMENTS: Abnormal gait, decreased activity tolerance, decreased balance, decreased endurance, decreased mobility, difficulty walking, decreased ROM, decreased strength, hypomobility, increased edema, increased fascial restrictions, increased muscle spasms, impaired flexibility, impaired sensation, improper body mechanics, postural dysfunction, and pain.   ACTIVITY LIMITATIONS: carrying, lifting, bending, standing, squatting, sleeping, stairs, transfers, bed mobility, bathing, dressing, locomotion level, and caring for others  PARTICIPATION LIMITATIONS: meal prep, cleaning, driving, shopping, and community activity  PERSONAL FACTORS: Age, Fitness, Past/current experiences, and Time since onset of injury/illness/exacerbation are also affecting patient's functional outcome.   REHAB POTENTIAL: Good  CLINICAL DECISION MAKING: Evolving/moderate complexity  EVALUATION COMPLEXITY: Moderate   GOALS: Goals reviewed with patient? Yes  SHORT TERM GOALS: Target date: 05/23/2023  Patient will be independent with initial HEP. Baseline: Newly provided Goal status: Ongoing 05/08/2023  2.  Pt will have improved R knee ROM from 5 to 105 deg Baseline: 7 to 95 deg Goal status: Ongoing 05/08/2023   LONG TERM GOALS: Target date: 06/20/2023  Patient will be independent with advanced/ongoing HEP to improve outcomes and carryover.  Baseline:  Goal status: Ongoing 05/08/2023  2.  Patient will report at least 75% improvement in hip and knee pain  to improve QOL. Baseline: 2-6 out of 10 R hip pain Goal status: Ongoing 05/08/2023  3.  Patient will demonstrate improved R knee AROM to >/= 0-110 deg to allow for normal gait  and stair mechanics. Baseline: 7-95 Goal status: Ongoing 05/08/2023  4.  Patient will demonstrate improved functional LE strength as demonstrated by ability to perform single leg step downs from 6" step x10 without hand held assist for stairs. Baseline: Has to use bilat rails to maintain, gets pulling/discomfort in front of knee Goal status:   Ongoing 05/08/2023  5.  Patient will be able to ambulate 1000' with LRAD and normal gait pattern without increased pain to access community.  Baseline: Just started to walk without a/d 04/23/23 around the house Goal status: Ongoing 05/08/2023   6.  Patient will have >/=5.5 point average in PSFS to demonstrate MCID for improved functional ability. Baseline: 2.5 Goal status: Ongoing 05/08/2023  8.  Patient will demonstrate at least 28/30 on FGA to decrease risk of falls. Baseline: 23/30 Goal status: Ongoing 05/08/2023    PLAN:  PT FREQUENCY: 2x/week  PT DURATION: 8 weeks  PLANNED INTERVENTIONS: 97164- PT Re-evaluation, 97110-Therapeutic exercises, 97530- Therapeutic activity, 97112- Neuromuscular re-education, 97535- Self Care, 62130- Manual therapy, [phone]- Gait training, [phone]- Aquatic Therapy, [phone]- Electrical stimulation (unattended), 97016- Vasopneumatic device, [id number]- Ultrasound, 13244- Ionotophoresis 4mg /ml Dexamethasone, Patient/Family education, Balance training, Stair training, Taping, Dry Needling, Joint mobilization, Spinal mobilization, Cryotherapy, and Moist heat  PLAN FOR NEXT SESSION:   Assess/modify HEP PRN. Continue exercises and functional activities with awareness of any hip motions to compensate, gait and balance.    Vladimir Faster, PT, DPT 05/08/2023, 11:07 AM

## 2023-05-08 NOTE — Telephone Encounter (Signed)
 Patrick [name], can you call [name] and get him an appointment for x-rays of his lumbar spine either today or sometime this week?  After they are done, I can dictate them and I think the next step would be MRI of his lumbar spine and possibly MRI of his hip if he wants to go that route.

## 2023-05-10 ENCOUNTER — Other Ambulatory Visit (INDEPENDENT_AMBULATORY_CARE_PROVIDER_SITE_OTHER): Payer: Self-pay

## 2023-05-10 ENCOUNTER — Ambulatory Visit

## 2023-05-10 ENCOUNTER — Ambulatory Visit (INDEPENDENT_AMBULATORY_CARE_PROVIDER_SITE_OTHER): Payer: 59 | Admitting: Physical Therapy

## 2023-05-10 ENCOUNTER — Encounter: Payer: Self-pay | Admitting: Physical Therapy

## 2023-05-10 DIAGNOSIS — M25661 Stiffness of right knee, not elsewhere classified: Secondary | ICD-10-CM

## 2023-05-10 DIAGNOSIS — R2689 Other abnormalities of gait and mobility: Secondary | ICD-10-CM | POA: Diagnosis not present

## 2023-05-10 DIAGNOSIS — M545 Low back pain, unspecified: Secondary | ICD-10-CM

## 2023-05-10 DIAGNOSIS — M25561 Pain in right knee: Secondary | ICD-10-CM | POA: Diagnosis not present

## 2023-05-10 DIAGNOSIS — M6281 Muscle weakness (generalized): Secondary | ICD-10-CM

## 2023-05-10 DIAGNOSIS — Z96651 Presence of right artificial knee joint: Secondary | ICD-10-CM

## 2023-05-10 NOTE — Therapy (Signed)
 OUTPATIENT PHYSICAL THERAPY TREATMENT   Patient Name: Daulton Harbaugh MRN: [id number] DOB:Dec 18, 1960, 63 y.o., male Today's Date: 05/10/2023  END OF SESSION:  PT End of Session - 05/10/23 0838     Visit Number 5    Number of Visits 16    Date for PT Re-Evaluation 06/20/23    Authorization Type [employer]    Authorization Time Period no copay    Authorization - Number of Visits 30    PT Start Time 0840    PT Stop Time 0919    PT Time Calculation (min) 39 min    Activity Tolerance Patient tolerated treatment well    Behavior During Therapy WFL for tasks assessed/performed                 Past Medical History:  Diagnosis Date   Allergy    Arthritis    left knee   Dysrhythmia    " [name]"   Heart murmur    History of hiatal hernia    4 years ago per patient- no surgery needed   History of kidney stones    Hx of adenomatous polyp of colon    Hyperlipidemia    Hypertension    Past Surgical History:  Procedure Laterality Date   APPENDECTOMY     Arthroscopic knee surgeries on both knees     CHOLECYSTECTOMY     COLONOSCOPY     NASAL SEPTOPLASTY W/ TURBINOPLASTY Bilateral 04/13/2019   Procedure: NASAL SEPTOPLASTY WITH BILATERAL TURBINATE REDUCTION;  Surgeon: Newman Pies, MD;  Location: [facility];  Service: ENT;  Laterality: Bilateral;   TOTAL KNEE ARTHROPLASTY Left 01/21/2020   Procedure: LEFT TOTAL KNEE ARTHROPLASTY;  Surgeon: Cammy Copa, MD;  Location: [facility] OR;  Service: Orthopedics;  Laterality: Left;   TOTAL KNEE ARTHROPLASTY Right 04/08/2023   Procedure: RIGHT TOTAL KNEE ARTHROPLASTY;  Surgeon: Cammy Copa, MD;  Location: [facility] OR;  Service: Orthopedics;  Laterality: Right;   VASECTOMY     Patient Active Problem List   Diagnosis Date Noted   Arthritis of right knee 04/21/2023   S/P total knee replacement, right 04/08/2023   Ulnar neuropathy at elbow 01/10/2023   Hx of adenomatous polyp of colon    B12  deficiency 09/02/2022   Asthmatic bronchitis 06/07/2022   Back pain 01/18/2022   Calcification of patellar tendon determined by X-ray 05/15/2021   Coronary artery calcification 12/08/2020   Melanocytic nevi of trunk 08/18/2020   Sebaceous cyst 08/18/2020   Seborrheic keratosis 08/18/2020   S/P total knee arthroplasty, 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   Acute bursitis of right shoulder 02/02/2019   Sinusitis 11/06/2018   Penicillin allergy 11/06/2018   Hip flexor tightness 09/29/2018   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   Hyperglycemia 08/02/2018   HLD (hyperlipidemia) 08/02/2018   Vitamin D deficiency 08/02/2018   Encounter for well adult exam with abnormal findings 07/30/2018   HTN (hypertension) 07/30/2018   Tinnitus aurium, 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   Snoring 05/09/2017   Other allergic rhinitis 02/21/2015    PCP: Corwin Levins, MD  REFERRING PROVIDER: Julieanne Cotton, PA-C  REFERRING DIAG: [id number] (ICD-10-CM) - S/P total knee arthroplasty, right  THERAPY DIAG:  Muscle weakness (generalized)  Other abnormalities of gait and mobility  Acute pain of right knee  Stiffness of right knee, not elsewhere classified  S/P total knee arthroplasty, right  Rationale for Evaluation and Treatment: Rehabilitation  ONSET DATE:  04/08/23 R TKA  SUBJECTIVE:   SUBJECTIVE STATEMENT:  Still waiting to hear about f/u on my back, planning on sending a message when I get home today. Hip is still an issue. It might be marginally improving. Knee is feeling OK. Went to gym at [employer] yesterday for the first time since surgery, felt good after. Zella Ball talked me into getting new shoes, got new ones yesterday and hip is slightly better.    PERTINENT HISTORY: L TKA  PAIN:  Are you having pain? Yes: NPRS scale:  knee 0/10, R hip 1/10 Pain location: R hip Pain description: nagging R lateral/posterior hip Aggravating factors: CPM, standing Relieving factors:  Massager helps a little  PAIN:  Are you having pain? Yes: NPRS scale:   0 more stiffness Pain location: right knee Pain description: Tightness Aggravating factors: Nothing Relieving factors: ice  PRECAUTIONS: None  RED FLAGS: None   WEIGHT BEARING RESTRICTIONS: No  FALLS:  Has patient fallen in last 6 months? No  LIVING ENVIRONMENT: Lives with: lives with their spouse Lives in: House/apartment Stairs: Yes: Internal: 18 steps; can reach both and External: 4 steps; on left going up Has following equipment at home: None  OCCUPATION: Retired -- Works part time for [employer] during baseball season (April)  PLOF: Independent  PATIENT GOALS: Return to normal activities without pain  NEXT MD VISIT: 05/20/23  OBJECTIVE:  Note: Objective measures were completed at Evaluation unless otherwise noted.  DIAGNOSTIC FINDINGS: n/a  PATIENT SURVEYS:  The Patient-Specific Functional Scale  Initial:  I am going to ask you to identify up to 3 important activities that you are unable to do or are having difficulty with as a result of this problem.  Today are there any activities that you are unable to do or having difficulty with because of this?  (Patient shown scale and patient rated each activity)  Follow up: When you first came in you had difficulty performing these activities.  Today do you still have difficulty?  Patient-Specific activity scoring scheme (Point to one number):  0 1 2 3 4 5 6 7 8 9  10 Unable                                                                                                          Able to perform To perform                                                                                                    activity at the same Activity         Level as before  Injury or problem Activity Initial (eval): Follow up:  Stepping down 3   2.    Putting socks/shoes on  5   3.   Sleep without hip discomfort 2   4. Driving 0 (hasn't tested it yet)   AVERAGE 2.5     COGNITION: Overall cognitive status: Within functional limits for tasks assessed    SENSATION: "Just a little numbness across the top on R"  EDEMA:  Circumferential: 36.5 cm L; 39 cm R  MUSCLE LENGTH: Hamstrings: did not assess Thomas test: Right appears WFL  POSTURE: rounded shoulders and increased thoracic kyphosis  PALPATION: Tightness in R quad and glute med  LOWER EXTREMITY ROM:  Active ROM Right eval Left eval Right 04/29/23 Right 05/10/23  Knee flexion 95 112 A: 105 A: 103, AAROM 110  Knee extension -7 0 A: -3 (seated LAQ) A: -3 LAQ    (Blank rows = not tested)  LOWER EXTREMITY MMT:  MMT Right eval Left eval  Hip flexion 4 5  Hip extension 4 4  Hip abduction 3+ 5  Knee flexion 5 5  Knee extension 5 5  Ankle dorsiflexion 5 5   (Blank rows = not tested)   FUNCTIONAL TESTS:  5 times sit to stand: 11.97 sec Functional gait assessment: 23/30     GAIT: Distance walked: Into clinic Assistive device utilized: None Level of assistance: Complete Independence Comments: Reciprocal pattern, mildly antalgic with knee slightly flexed during stance phase, unable to fully extend knee during swing                                                                                                                                TREATMENT DATE:   05/10/23  Scifit seat 9 x4 minutes, progressing to seat 8 full rotations x6 minutes ( x10 min total) for ROM, w/u   ROM check  (see above) Bridges + progressive knee flexion 3x5 sets Supine quad/hip flexor stretch R LE 3x30 seconds  Single leg bridge 1x10 B   Knee flexion stretch 8 inch box 15x5 seconds Forward step ups 6 inch box x12  Forward step downs 4 inch box 2x12 R  Forward lunges onto BOSU R LE x12 Lateral lunges onto BOSU R LE x12  STS with R LE staggered back/L LE forward x10 from  standard chair  SLS with one foot on bosu/one foot on solid surface 1x30 seconds B     05/08/2023: Therapeutic Exercise: Recumbent bike seat 8 with flexion stretch for 1 min, then level 1 full revolutions 9 min with no noted hip compensation. Hamstring stretch strap SLR 30 sec hold 3 reps during rest of leg press. Quad stretch hooklying with strap knee flexion with PT cues on position to limit hip rotation 30 sec hold 3 reps. Hamstring curl green Tband 10 reps controlled eccentric motion Bridge with verbal / tactile cues for level pelvis 10 reps LAQ 5# 10 reps  Therapeutic Activities: PT  verbal & demo education on positioning in sidelying including "tenting" sheets off feet. PT discussed new shoes with assessment for best design for him.  His shoes are >44 years old which could be a contributing factor. Pt verbalized understanding.  Leg press BLEs 125# 10 reps 2 sets;  RLE only 62# 10 reps 2 sets. PT cued on proper motion without hip rotation.     05/02/23 TherEx Recumbent bike partial revolutions x 8 min; seat 8 Rt LAQ 4# with 5 sec hold; 3x10 AA heel slides x 10 reps on Rt  TherAct Leg Press bil 125# 3x10; RLE only 56# 3x10 RLE on 4" step with Lt heel taps 2x10 Sit to/from stand 2x10  Modalities Vaso Rt knee x 10 min; mod pressure 34 deg in elevation   04/29/23 TherEx SciFit bike L4 x 8 min; seat 11 Rt LAQ 4# 3x10; 5 sec hold ROM measurements - see above for details  TherAct Leg Press bil 100# 3x10; RLE only 50# 3x10  Neuro Re-ed Seated SLR on Rt 2x10; cues for quad activation; 5 sec hold with mod cues  Modalities Vaso Rt knee x 10 min; mod pressure 34 deg in elevation   PATIENT EDUCATION:  Education details: Exam findings, POC, initial HEP, sleep positioning / trial of heat to decrease hip discomfort Person educated: Patient Education method: Explanation, Demonstration, and Handouts Education comprehension: verbalized understanding, returned demonstration, and  needs further education  HOME EXERCISE PROGRAM: Access Code: [email or web address] URL: [email or web address] Date: 04/25/2023 Prepared by: Vernon Prey April Kirstie Peri  Exercises - Supine Quadriceps Stretch with Strap on Table  - 1 x daily - 7 x weekly - 2 sets - 30 sec hold - Supine Piriformis Stretch with Foot on Ground  - 1 x daily - 7 x weekly - 2 sets - 30 sec hold - Clamshell with Resistance  - 1 x daily - 7 x weekly - 2 sets - 10 reps - Squat with Chair Touch and Resistance Loop  - 1 x daily - 7 x weekly - 2 sets - 10 reps - Hip Abduction with Resistance Loop  - 1 x daily - 7 x weekly - 2 sets - 10 reps - Hip Extension with Resistance Loop  - 1 x daily - 7 x weekly - 2 sets - 10 reps - Prone Knee Flexion  - 1 x daily - 7 x weekly - 2 sets - 10 reps - Prone Quadriceps Set  - 1 x daily - 7 x weekly - 2 sets - 10 reps  ASSESSMENT:  CLINICAL IMPRESSION:  Pt arrives today, still having issues with his hip and back and planning on reaching out to MD again about this later today. Continued working on ROM and strength as able today , updated measurements as well. Did well with progression of exercises/activities today with no increase in pain, balance looks great too. Will continue to progress.      OBJECTIVE IMPAIRMENTS: Abnormal gait, decreased activity tolerance, decreased balance, decreased endurance, decreased mobility, difficulty walking, decreased ROM, decreased strength, hypomobility, increased edema, increased fascial restrictions, increased muscle spasms, impaired flexibility, impaired sensation, improper body mechanics, postural dysfunction, and pain.   ACTIVITY LIMITATIONS: carrying, lifting, bending, standing, squatting, sleeping, stairs, transfers, bed mobility, bathing, dressing, locomotion level, and caring for others  PARTICIPATION LIMITATIONS: meal prep, cleaning, driving, shopping, and community activity  PERSONAL FACTORS: Age, Fitness, Past/current experiences, and  Time since onset of injury/illness/exacerbation are also affecting patient's functional outcome.   REHAB POTENTIAL: Good  CLINICAL DECISION MAKING: Evolving/moderate complexity  EVALUATION COMPLEXITY: Moderate   GOALS: Goals reviewed with patient? Yes  SHORT TERM GOALS: Target date: 05/23/2023  Patient will be independent with initial HEP. Baseline: Newly provided Goal status: Ongoing 05/08/2023  2.  Pt will have improved R knee ROM from 5 to 105 deg Baseline: 7 to 95 deg Goal status: Ongoing 05/08/2023   LONG TERM GOALS: Target date: 06/20/2023  Patient will be independent with advanced/ongoing HEP to improve outcomes and carryover.  Baseline:  Goal status: Ongoing 05/08/2023  2.  Patient will report at least 75% improvement in hip and knee pain to improve QOL. Baseline: 2-6 out of 10 R hip pain Goal status: Ongoing 05/08/2023  3.  Patient will demonstrate improved R knee AROM to >/= 0-110 deg to allow for normal gait and stair mechanics. Baseline: 7-95 Goal status: Ongoing 05/08/2023  4.  Patient will demonstrate improved functional LE strength as demonstrated by ability to perform single leg step downs from 6" step x10 without hand held assist for stairs. Baseline: Has to use bilat rails to maintain, gets pulling/discomfort in front of knee Goal status:   Ongoing 05/08/2023  5.  Patient will be able to ambulate 1000' with LRAD and normal gait pattern without increased pain to access community.  Baseline: Just started to walk without a/d 04/23/23 around the house Goal status: Ongoing 05/08/2023   6.  Patient will have >/=5.5 point average in PSFS to demonstrate MCID for improved functional ability. Baseline: 2.5 Goal status: Ongoing 05/08/2023  8.  Patient will demonstrate at least 28/30 on FGA to decrease risk of falls. Baseline: 23/30 Goal status: Ongoing 05/08/2023    PLAN:  PT FREQUENCY: 2x/week  PT DURATION: 8 weeks  PLANNED INTERVENTIONS: 97164- PT Re-evaluation,  97110-Therapeutic exercises, 97530- Therapeutic activity, 97112- Neuromuscular re-education, 97535- Self Care, 84696- Manual therapy, [phone]- Gait training, [phone]- Aquatic Therapy, [phone]- Electrical stimulation (unattended), 97016- Vasopneumatic device, [id number]- Ultrasound, 72536- Ionotophoresis 4mg /ml Dexamethasone, Patient/Family education, Balance training, Stair training, Taping, Dry Needling, Joint mobilization, Spinal mobilization, Cryotherapy, and Moist heat  PLAN FOR NEXT SESSION:   Assess/modify HEP PRN. Continue exercises and functional activities with awareness of any hip motions to compensate, gait and balance. Any word from MD about back imaging?    Nedra Hai, PT, DPT 05/10/23 9:21 AM

## 2023-05-13 ENCOUNTER — Encounter: Payer: Self-pay | Admitting: Physical Therapy

## 2023-05-13 ENCOUNTER — Ambulatory Visit (INDEPENDENT_AMBULATORY_CARE_PROVIDER_SITE_OTHER): Payer: 59 | Admitting: Physical Therapy

## 2023-05-13 DIAGNOSIS — R2689 Other abnormalities of gait and mobility: Secondary | ICD-10-CM

## 2023-05-13 DIAGNOSIS — M25661 Stiffness of right knee, not elsewhere classified: Secondary | ICD-10-CM

## 2023-05-13 DIAGNOSIS — M6281 Muscle weakness (generalized): Secondary | ICD-10-CM

## 2023-05-13 DIAGNOSIS — M25561 Pain in right knee: Secondary | ICD-10-CM

## 2023-05-13 NOTE — Therapy (Signed)
 OUTPATIENT PHYSICAL THERAPY TREATMENT   Patient Name: Patrick [name] MRN: [id number] DOB:[birth year], 63 y.o., male Today's Date: 05/13/2023  END OF SESSION:  PT End of Session - 05/13/23 1306     Visit Number 6    Number of Visits 16    Date for PT Re-Evaluation 06/20/23    Authorization Type [employer]    Authorization Time Period no copay    Authorization - Number of Visits 30    PT Start Time 1300    PT Stop Time 1345    PT Time Calculation (min) 45 min    Activity Tolerance Patient tolerated treatment well    Behavior During Therapy WFL for tasks assessed/performed                  Past Medical History:  Diagnosis Date   Allergy    Arthritis    left knee   Dysrhythmia    " [name]"   Heart murmur    History of hiatal hernia    4 years ago per patient- no surgery needed   History of kidney stones    Hx of adenomatous polyp of colon    Hyperlipidemia    Hypertension    Past Surgical History:  Procedure Laterality Date   APPENDECTOMY     Arthroscopic knee surgeries on both knees     CHOLECYSTECTOMY     COLONOSCOPY     NASAL SEPTOPLASTY W/ TURBINOPLASTY Bilateral 04/13/2019   Procedure: NASAL SEPTOPLASTY WITH BILATERAL TURBINATE REDUCTION;  Surgeon: Newman Pies, MD;  Location: [facility];  Service: ENT;  Laterality: Bilateral;   TOTAL KNEE ARTHROPLASTY Left 01/21/2020   Procedure: LEFT TOTAL KNEE ARTHROPLASTY;  Surgeon: Cammy Copa, MD;  Location: [facility] OR;  Service: Orthopedics;  Laterality: Left;   TOTAL KNEE ARTHROPLASTY Right 04/08/2023   Procedure: RIGHT TOTAL KNEE ARTHROPLASTY;  Surgeon: Cammy Copa, MD;  Location[facility] OR;  Service: Orthopedics;  Laterality: Right;   VASECTOMY     Patient Active Problem List   Diagnosis Date Noted   Arthritis of right knee 04/21/2023   S/P total knee replacement, right 04/08/2023   Ulnar neuropathy at elbow 01/10/2023   Hx of adenomatous polyp of colon    B12  deficiency 09/02/2022   Asthmatic bronchitis 06/07/2022   Back pain 01/18/2022   Calcification of patellar tendon determined by X-ray 05/15/2021   Coronary artery calcification 12/08/2020   Melanocytic nevi of trunk 08/18/2020   Sebaceous cyst 08/18/2020   Seborrheic keratosis 08/18/2020   S/P total knee arthroplasty, 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   Acute bursitis of right shoulder 02/02/2019   Sinusitis 11/06/2018   Penicillin allergy 11/06/2018   Hip flexor tightness 09/29/2018   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   Hyperglycemia 08/02/2018   HLD (hyperlipidemia) 08/02/2018   Vitamin D deficiency 08/02/2018   Encounter for well adult exam with abnormal findings 07/30/2018   HTN (hypertension) 07/30/2018   Tinnitus aurium, 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   Snoring 05/09/2017   Other allergic rhinitis 02/21/2015    PCP: Corwin Levins, MD  REFERRING PROVIDER: Julieanne Cotton, PA-C  REFERRING DIAG: [phone] (ICD-10-CM) - S/P total knee arthroplasty, right  THERAPY DIAG:  Muscle weakness (generalized)  Other abnormalities of gait and mobility  Acute pain of right knee  Stiffness of right knee, not elsewhere classified  Rationale for Evaluation and Treatment: Rehabilitation  ONSET DATE: 04/08/23 R TKA  SUBJECTIVE:  SUBJECTIVE STATEMENT: Did really well with hip for about a day but then last night was really uncomfortable.      PERTINENT HISTORY: L TKA  PAIN:  Are you having pain? Yes: NPRS scale:  knee 0/10, R hip 1/10 Pain location: R hip Pain description: nagging R lateral/posterior hip Aggravating factors: CPM, standing Relieving factors: Massager helps a little  PAIN:  Are you having pain? Yes: NPRS scale:   0 more stiffness Pain location: right knee Pain description: Tightness Aggravating factors: Nothing Relieving factors: ice  PRECAUTIONS: None  RED FLAGS: None   WEIGHT BEARING RESTRICTIONS: No  FALLS:  Has patient  fallen in last 6 months? No  LIVING ENVIRONMENT: Lives with: lives with their spouse Lives in: House/apartment Stairs: Yes: Internal: 18 steps; can reach both and External: 4 steps; on left going up Has following equipment at home: None  OCCUPATION: Retired -- Works part time for [employer] during baseball season (April)  PLOF: Independent  PATIENT GOALS: Return to normal activities without pain  NEXT MD VISIT: 05/20/23  OBJECTIVE:  Note: Objective measures were completed at Evaluation unless otherwise noted.  DIAGNOSTIC FINDINGS: n/a  PATIENT SURVEYS:  The Patient-Specific Functional Scale  Initial:  I am going to ask you to identify up to 3 important activities that you are unable to do or are having difficulty with as a result of this problem.  Today are there any activities that you are unable to do or having difficulty with because of this?  (Patient shown scale and patient rated each activity)  Follow up: When you first came in you had difficulty performing these activities.  Today do you still have difficulty?  Patient-Specific activity scoring scheme (Point to one number):  0 1 2 3 4 5 6 7 8 9  10 Unable                                                                                                          Able to perform To perform                                                                                                    activity at the same Activity         Level as before  Injury or problem Activity Initial (eval): Follow up:  Stepping down 3   2.   Putting socks/shoes on  5   3.   Sleep without hip discomfort 2   4. Driving 0 (hasn't tested it yet)   AVERAGE 2.5     COGNITION: Overall cognitive status: Within functional limits for tasks assessed    SENSATION: "Just a little numbness across the top on R"  EDEMA:  Circumferential:  36.5 cm L; 39 cm R  MUSCLE LENGTH: Hamstrings: did not assess Thomas test: Right appears WFL  POSTURE: rounded shoulders and increased thoracic kyphosis  PALPATION: Tightness in R quad and glute med  LOWER EXTREMITY ROM:  Active ROM Right eval Left eval Right 04/29/23 Right 05/10/23 Right 05/12/20  Knee flexion 95 112 A: 105 A: 103, AAROM 110 A: 115 AA: 122  Knee extension -7 0 A: -3 (seated LAQ) A: -3 LAQ     (Blank rows = not tested)  LOWER EXTREMITY MMT:  MMT Right eval Left eval  Hip flexion 4 5  Hip extension 4 4  Hip abduction 3+ 5  Knee flexion 5 5  Knee extension 5 5  Ankle dorsiflexion 5 5   (Blank rows = not tested)   FUNCTIONAL TESTS:  5 times sit to stand: 11.97 sec Functional gait assessment: 23/30     GAIT: Distance walked: Into clinic Assistive device utilized: None Level of assistance: Complete Independence Comments: Reciprocal pattern, mildly antalgic with knee slightly flexed during stance phase, unable to fully extend knee during swing                                                                                                                                TREATMENT DATE:  05/13/23 TherEx Recumbent bike seat 7 x 8 min; partial revolutions Knee flexion stretch 20 x 10 sec hold on Rt; foot onto 8" step Knee extension stretch 10 x 10 sec with Rt foot on 8" step and overpressure at distal thigh Rt LAQ 10# 3x10; 5 sec hold AA heel slides x 10 reps   TherAct Leg press BLEs 125# 10 reps 3 sets;  RLE only 75# 10 reps 2 sets then 87# x10 reps Forward step ups onto 8" block x 10 bil Lateral step ups onto 8" block x 10 bil   05/10/23 Scifit seat 9 x4 minutes, progressing to seat 8 full rotations x6 minutes ( x10 min total) for ROM, w/u   ROM check  (see above) Bridges + progressive knee flexion 3x5 sets Supine quad/hip flexor stretch R LE 3x30 seconds  Single leg bridge 1x10 B   Knee flexion stretch 8 inch box 15x5 seconds Forward  step ups 6 inch box x12  Forward step downs 4 inch box 2x12 R  Forward lunges onto BOSU R LE x12 Lateral lunges onto BOSU R LE x12  STS with R LE staggered back/L LE forward x10 from standard chair  SLS  with one foot on bosu/one foot on solid surface 1x30 seconds B     05/08/2023: Therapeutic Exercise: Recumbent bike seat 8 with flexion stretch for 1 min, then level 1 full revolutions 9 min with no noted hip compensation. Hamstring stretch strap SLR 30 sec hold 3 reps during rest of leg press. Quad stretch hooklying with strap knee flexion with PT cues on position to limit hip rotation 30 sec hold 3 reps. Hamstring curl green Tband 10 reps controlled eccentric motion Bridge with verbal / tactile cues for level pelvis 10 reps LAQ 5# 10 reps  Therapeutic Activities: PT verbal & demo education on positioning in sidelying including "tenting" sheets off feet. PT discussed new shoes with assessment for best design for him.  His shoes are >54 years old which could be a contributing factor. Pt verbalized understanding.  Leg press BLEs 125# 10 reps 2 sets;  RLE only 62# 10 reps 2 sets. PT cued on proper motion without hip rotation.     05/02/23 TherEx Recumbent bike partial revolutions x 8 min; seat 8 Rt LAQ 4# with 5 sec hold; 3x10 AA heel slides x 10 reps on Rt  TherAct Leg Press bil 125# 3x10; RLE only 56# 3x10 RLE on 4" step with Lt heel taps 2x10 Sit to/from stand 2x10  Modalities Vaso Rt knee x 10 min; mod pressure 34 deg in elevation   04/29/23 TherEx SciFit bike L4 x 8 min; seat 11 Rt LAQ 4# 3x10; 5 sec hold ROM measurements - see above for details  TherAct Leg Press bil 100# 3x10; RLE only 50# 3x10  Neuro Re-ed Seated SLR on Rt 2x10; cues for quad activation; 5 sec hold with mod cues  Modalities Vaso Rt knee x 10 min; mod pressure 34 deg in elevation   PATIENT EDUCATION:  Education details: Exam findings, POC, initial HEP, sleep positioning / trial of heat to  decrease hip discomfort Person educated: Patient Education method: Explanation, Demonstration, and Handouts Education comprehension: verbalized understanding, returned demonstration, and needs further education  HOME EXERCISE PROGRAM: Access Code: [email or web address] URL: [email or web address] Date: 04/25/2023 Prepared by: Vernon Prey April Kirstie Peri  Exercises - Supine Quadriceps Stretch with Strap on Table  - 1 x daily - 7 x weekly - 2 sets - 30 sec hold - Supine Piriformis Stretch with Foot on Ground  - 1 x daily - 7 x weekly - 2 sets - 30 sec hold - Clamshell with Resistance  - 1 x daily - 7 x weekly - 2 sets - 10 reps - Squat with Chair Touch and Resistance Loop  - 1 x daily - 7 x weekly - 2 sets - 10 reps - Hip Abduction with Resistance Loop  - 1 x daily - 7 x weekly - 2 sets - 10 reps - Hip Extension with Resistance Loop  - 1 x daily - 7 x weekly - 2 sets - 10 reps - Prone Knee Flexion  - 1 x daily - 7 x weekly - 2 sets - 10 reps - Prone Quadriceps Set  - 1 x daily - 7 x weekly - 2 sets - 10 reps  ASSESSMENT:  CLINICAL IMPRESSION: Excellent flexion ROM noted today and overall progressing well with PT.  Anticipate we are nearing d/c from PT for knee.     OBJECTIVE IMPAIRMENTS: Abnormal gait, decreased activity tolerance, decreased balance, decreased endurance, decreased mobility, difficulty walking, decreased ROM, decreased strength, hypomobility, increased edema, increased fascial restrictions, increased muscle spasms, impaired flexibility, impaired sensation,  improper body mechanics, postural dysfunction, and pain.   ACTIVITY LIMITATIONS: carrying, lifting, bending, standing, squatting, sleeping, stairs, transfers, bed mobility, bathing, dressing, locomotion level, and caring for others  PARTICIPATION LIMITATIONS: meal prep, cleaning, driving, shopping, and community activity  PERSONAL FACTORS: Age, Fitness, Past/current experiences, and Time since onset of  injury/illness/exacerbation are also affecting patient's functional outcome.   REHAB POTENTIAL: Good  CLINICAL DECISION MAKING: Evolving/moderate complexity  EVALUATION COMPLEXITY: Moderate   GOALS: Goals reviewed with patient? Yes  SHORT TERM GOALS: Target date: 05/23/2023  Patient will be independent with initial HEP. Baseline: Newly provided Goal status: Ongoing 05/08/2023  2.  Pt will have improved R knee ROM from 5 to 105 deg Baseline: 7 to 95 deg Goal status: Ongoing 05/08/2023   LONG TERM GOALS: Target date: 06/20/2023  Patient will be independent with advanced/ongoing HEP to improve outcomes and carryover.  Baseline:  Goal status: Ongoing 05/08/2023  2.  Patient will report at least 75% improvement in hip and knee pain to improve QOL. Baseline: 2-6 out of 10 R hip pain Goal status: Ongoing 05/08/2023  3.  Patient will demonstrate improved R knee AROM to >/= 0-110 deg to allow for normal gait and stair mechanics. Baseline: 7-95 Goal status: Ongoing 05/08/2023  4.  Patient will demonstrate improved functional LE strength as demonstrated by ability to perform single leg step downs from 6" step x10 without hand held assist for stairs. Baseline: Has to use bilat rails to maintain, gets pulling/discomfort in front of knee Goal status:   Ongoing 05/08/2023  5.  Patient will be able to ambulate 1000' with LRAD and normal gait pattern without increased pain to access community.  Baseline: Just started to walk without a/d 04/23/23 around the house Goal status: Ongoing 05/08/2023   6.  Patient will have >/=5.5 point average in PSFS to demonstrate MCID for improved functional ability. Baseline: 2.5 Goal status: Ongoing 05/08/2023  8.  Patient will demonstrate at least 28/30 on FGA to decrease risk of falls. Baseline: 23/30 Goal status: Ongoing 05/08/2023    PLAN:  PT FREQUENCY: 2x/week  PT DURATION: 8 weeks  PLANNED INTERVENTIONS: 97164- PT Re-evaluation, 97110-Therapeutic  exercises, 97530- Therapeutic activity, 97112- Neuromuscular re-education, 97535- Self Care, 82956- Manual therapy, [phone]- Gait training, [phone]- Aquatic Therapy, [phone]- Electrical stimulation (unattended), 97016- Vasopneumatic device, [id number]- Ultrasound, 52841- Ionotophoresis 4mg /ml Dexamethasone, Patient/Family education, Balance training, Stair training, Taping, Dry Needling, Joint mobilization, Spinal mobilization, Cryotherapy, and Moist heat  PLAN FOR NEXT SESSION:   will need MD note,  Assess/modify HEP PRN. Continue exercises and functional activities with awareness of any hip motions to compensate, gait and balance.   Clarita Crane, PT, DPT 05/13/23 1:49 PM

## 2023-05-15 ENCOUNTER — Other Ambulatory Visit: Payer: Self-pay | Admitting: Surgical

## 2023-05-15 MED ORDER — METHOCARBAMOL 500 MG PO TABS: 500.0000 mg | ORAL_TABLET | Freq: Four times a day (QID) | ORAL | 0 refills | Status: DC | PRN

## 2023-05-15 NOTE — Telephone Encounter (Signed)
 Patrick [name], can we order MRI lumbar spine from [facility]?

## 2023-05-16 ENCOUNTER — Other Ambulatory Visit: Payer: Self-pay

## 2023-05-16 ENCOUNTER — Ambulatory Visit (INDEPENDENT_AMBULATORY_CARE_PROVIDER_SITE_OTHER): Payer: 59 | Admitting: Physical Therapy

## 2023-05-16 ENCOUNTER — Encounter: Payer: Self-pay | Admitting: Physical Therapy

## 2023-05-16 DIAGNOSIS — M25661 Stiffness of right knee, not elsewhere classified: Secondary | ICD-10-CM | POA: Diagnosis not present

## 2023-05-16 DIAGNOSIS — M6281 Muscle weakness (generalized): Secondary | ICD-10-CM

## 2023-05-16 DIAGNOSIS — M25561 Pain in right knee: Secondary | ICD-10-CM

## 2023-05-16 DIAGNOSIS — R2689 Other abnormalities of gait and mobility: Secondary | ICD-10-CM

## 2023-05-16 DIAGNOSIS — M545 Low back pain, unspecified: Secondary | ICD-10-CM

## 2023-05-16 NOTE — Progress Notes (Signed)
 Patrick [name] [address] [place] [address] Phone: [phone] Subjective:   Patrick [name], am serving as a scribe for Dr. Antoine [name].  I'm seeing this patient by the request  of:  Patrick Levins, MD  CC: Elbow pain follow-up  Patrick [name]  03/14/2023 Severe noted on the left side. Given injection today. No significant bruising noted after the injection. We discussed with patient to seek medical attention if they notice any significant findings. Discussed avoiding certain movements if possible. Discussed continuing the compression. Follow-up again in 6 to 8 weeks after patient has his knee replacement. Differential includes cervical radiculopathy but I do not think that it is contributing as much. Do believe though that patient does have improvement with the gabapentin.   Update 05/21/2023 Patrick [name] is a 63 y.o. male coming in with complaint of L elbow pain. Patient states that it is much better. Was putting pressure directly on olecranon when trying to get up from bed due to knee replacement. Has a zing of pain at that time but not during the day. Numbness has subsided.   Had hip bursal injection and hip joint injection which were not helpful. Back and hip causing patient to not sleep. Gabapentin 400mg  prescribed by another provider.   Reviewing outside records patient was seen by orthopedics for back and hip pain.  Recently did get x-rays March 7.  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   Patient also had a right total knee replacement April 08, 2023.  Scheduled for MRI of the lumbar spine on March 22.  Past Medical History:  Diagnosis Date   Allergy    Arthritis    left knee   Dysrhythmia    " [name]"   Heart murmur    History of hiatal hernia    4 years ago  per patient- no surgery needed   History of kidney stones    Hx of adenomatous polyp of colon    Hyperlipidemia    Hypertension    Past Surgical History:  Procedure Laterality Date   APPENDECTOMY     Arthroscopic knee surgeries on both knees     CHOLECYSTECTOMY     COLONOSCOPY     NASAL SEPTOPLASTY W/ TURBINOPLASTY Bilateral 04/13/2019   Procedure: NASAL SEPTOPLASTY WITH BILATERAL TURBINATE REDUCTION;  Surgeon: Newman Pies, MD;  Location: [facility];  Service: ENT;  Laterality: Bilateral;   TOTAL KNEE ARTHROPLASTY Left 01/21/2020   Procedure: LEFT TOTAL KNEE ARTHROPLASTY;  Surgeon: Cammy Copa, MD;  Location: [facility] OR;  Service: Orthopedics;  Laterality: Left;   TOTAL KNEE ARTHROPLASTY Right 04/08/2023   Procedure: RIGHT TOTAL KNEE ARTHROPLASTY;  Surgeon: Cammy Copa, MD;  Location[facility] OR;  Service: Orthopedics;  Laterality: Right;   VASECTOMY     Social History   Socioeconomic History   Marital status: Married    Spouse name: Not on file   Number of children: Not on file   Years of education: Not on file   Highest education level: Not on file  Occupational History   Occupation: Teacher, early years/pre, Network engineer, Semi-retired  Tobacco Use   Smoking status: Former    Types: Cigars, Cigarettes   Smokeless tobacco: Never   Tobacco comments:    one cigar every few years  Vaping Use  Vaping status: Never Used  Substance and Sexual Activity   Alcohol use: Yes    Alcohol/week: 5.0 standard drinks of alcohol    Types: 5 Standard drinks or equivalent per week    Comment: socially   Drug use: No   Sexual activity: Yes  Other Topics Concern   Not on file  Social History Narrative   Married.   Education: [employer]   Exercise: Yes   Social Drivers of Corporate investment banker Strain: Not on file  Food Insecurity: No Food Insecurity (04/08/2023)   Hunger Vital Sign    Worried About Running Out of Food in the Last Year: Never true    Ran Out of Food in the Last Year:  Never true  Transportation Needs: No Transportation Needs (04/08/2023)   PRAPARE - Administrator, Civil Service (Medical): No    Lack of Transportation (Non-Medical): No  Physical Activity: Not on file  Stress: Not on file  Social Connections: Not on file   Allergies  Allergen Reactions   Penicillins Hives    Rash around neck; 04/08/23 tolerated cefazolin    Family History  Adopted: Yes  Problem Relation Age of Onset   Colon cancer Neg Hx    Colon polyps Neg Hx    Esophageal cancer Neg Hx    Rectal cancer Neg Hx    Stomach cancer Neg Hx      Current Outpatient Medications (Cardiovascular):    rosuvastatin (CRESTOR) 20 MG tablet, Take 1 tablet (20 mg total) by mouth daily.   telmisartan (MICARDIS) 80 MG tablet, Take 1 tablet (80 mg total) by mouth daily.  Current Outpatient Medications (Respiratory):    cetirizine (ZYRTEC) 10 MG tablet, Take 10 mg by mouth at bedtime.    fluticasone (FLONASE) 50 MCG/ACT nasal spray, Place 1 spray into both nostrils daily.  Current Outpatient Medications (Analgesics):    acetaminophen (TYLENOL) 325 MG tablet, TAKE 1 TO 2 TABLETS BY MOUTH EVERY 6 HOURS AS NEEDED FOR MILD PAIN SCORE RANGING 1 TO 3 OR TEMPERATURE GREATER THAN 100.5   aspirin 81 MG chewable tablet, CHEW 1 TABLET BY MOUTH 2 TIMES A DAY   celecoxib (CELEBREX) 100 MG capsule, TAKE 1 CAPSULE BY MOUTH 2 TIMES A DAY   oxyCODONE (OXY IR/ROXICODONE) 5 MG immediate release tablet, Take 1 tablet (5 mg total) by mouth every 12 (twelve) hours as needed for moderate pain (pain score 4-6) (pain score 4-6).  Current Outpatient Medications (Hematological):    Cyanocobalamin (B-12) 2000 MCG TABS, Take 2,000 mcg by mouth daily.  Current Outpatient Medications (Other):    Ascorbic Acid (VITAMIN C) 1000 MG tablet, Take 1,000 mg by mouth daily.   cefdinir (OMNICEF) 300 MG capsule, Take 1 capsule (300 mg total) by mouth 2 (two) times daily.   cholecalciferol (VITAMIN D3) 25 MCG (1000 UNIT)  tablet, Take 1,000 Units by mouth daily.    docusate sodium (COLACE) 100 MG capsule, Take 1 capsule (100 mg total) by mouth 2 (two) times daily.   gabapentin (NEURONTIN) 100 MG capsule, Take 2 capsules (200 mg total) by mouth 3 (three) times daily as needed.   Ginger, Zingiber officinalis, (GINGER ROOT) 550 MG CAPS, Take 550 mg by mouth daily.    methocarbamol (ROBAXIN) 500 MG tablet, Take 1 tablet (500 mg total) by mouth every 6 (six) hours as needed for muscle spasms.   Misc Natural Products (TART CHERRY ADVANCED) CAPS, Take 1 capsule by mouth daily.    Multiple Vitamin (  MULTIVITAMIN WITH MINERALS) TABS tablet, Take 1 tablet by mouth daily. Men's Over 50    Turmeric Curcumin 500 MG CAPS, Take 500 mg by mouth daily.    Reviewed prior external information including notes and imaging from  primary care provider As well as notes that were available from care everywhere and other healthcare systems.  Past medical history, social, surgical and family history all reviewed in electronic medical record.  No pertanent information unless stated regarding to the chief complaint.   Review of Systems:  No headache, visual changes, nausea, vomiting, diarrhea, constipation, dizziness, abdominal pain, skin rash, fevers, chills, night sweats, weight loss, swollen lymph nodes, body aches, joint swelling, chest pain, shortness of breath, mood changes. POSITIVE muscle aches  Objective  Blood pressure (!) 140/72, pulse (!) 103, height 5\' 10"  (1.778 m), weight 228 lb (103.4 kg), SpO2 95%.   General: No apparent distress alert and oriented x3 mood and affect normal, dressed appropriately.  HEENT: Pupils equal, extraocular movements intact  Respiratory: Patient's speak in full sentences and does not appear short of breath  Cardiovascular: No lower extremity edema, non tender, no erythema  Ambulating relatively well but is protecting his back.  Does have the replacement on the right knee that does still have some  mild swelling but no warmness to touch. Elbow exam bilaterally does have some tenderness to palpation over the medial aspect of the elbows.  Good range of motion noted.  Limited muscular skeletal ultrasound was performed and interpreted by Patrick [name], M  Limited ultrasound shows that patient does have significant improvement in the dilatation of the left nerve to normal area and seems to be sitting in the proper location now.  Right ulnar nerve at the medial aspect of the elbow at the ulnar groove still has some mild displacement noted. Impression: Interval improvement   Impression and Recommendations:     The above documentation has been reviewed and is accurate and complete Judi Saa, DO

## 2023-05-16 NOTE — Therapy (Signed)
 OUTPATIENT PHYSICAL THERAPY TREATMENT   Patient Name: Patrick [name] MRN: [id number] DOB:[birth year], 63 y.o., male Today's Date: 05/16/2023  END OF SESSION:  PT End of Session - 05/16/23 1256     Visit Number 7    Number of Visits 16    Date for PT Re-Evaluation 06/20/23    Authorization Type [employer]    Authorization Time Period no copay    Authorization - Visit Number 7    Authorization - Number of Visits 30    PT Start Time 1256    PT Stop Time 1341    PT Time Calculation (min) 45 min    Activity Tolerance Patient tolerated treatment well    Behavior During Therapy WFL for tasks assessed/performed                   Past Medical History:  Diagnosis Date   Allergy    Arthritis    left knee   Dysrhythmia    " [name]"   Heart murmur    History of hiatal hernia    4 years ago per patient- no surgery needed   History of kidney stones    Hx of adenomatous polyp of colon    Hyperlipidemia    Hypertension    Past Surgical History:  Procedure Laterality Date   APPENDECTOMY     Arthroscopic knee surgeries on both knees     CHOLECYSTECTOMY     COLONOSCOPY     NASAL SEPTOPLASTY W/ TURBINOPLASTY Bilateral 04/13/2019   Procedure: NASAL SEPTOPLASTY WITH BILATERAL TURBINATE REDUCTION;  Surgeon: Newman Pies, MD;  Location: [facility];  Service: ENT;  Laterality: Bilateral;   TOTAL KNEE ARTHROPLASTY Left 01/21/2020   Procedure: LEFT TOTAL KNEE ARTHROPLASTY;  Surgeon: Cammy Copa, MD;  Location: [facility] OR;  Service: Orthopedics;  Laterality: Left;   TOTAL KNEE ARTHROPLASTY Right 04/08/2023   Procedure: RIGHT TOTAL KNEE ARTHROPLASTY;  Surgeon: Cammy Copa, MD;  Location: [facility] OR;  Service: Orthopedics;  Laterality: Right;   VASECTOMY     Patient Active Problem List   Diagnosis Date Noted   Arthritis of right knee 04/21/2023   S/P total knee replacement, right 04/08/2023   Ulnar neuropathy at elbow 01/10/2023   Hx  of adenomatous polyp of colon    B12 deficiency 09/02/2022   Asthmatic bronchitis 06/07/2022   Back pain 01/18/2022   Calcification of patellar tendon determined by X-ray 05/15/2021   Coronary artery calcification 12/08/2020   Melanocytic nevi of trunk 08/18/2020   Sebaceous cyst 08/18/2020   Seborrheic keratosis 08/18/2020   S/P total knee arthroplasty, 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   Acute bursitis of right shoulder 02/02/2019   Sinusitis 11/06/2018   Penicillin allergy 11/06/2018   Hip flexor tightness 09/29/2018   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   Hyperglycemia 08/02/2018   HLD (hyperlipidemia) 08/02/2018   Vitamin D deficiency 08/02/2018   Encounter for well adult exam with abnormal findings 07/30/2018   HTN (hypertension) 07/30/2018   Tinnitus aurium, 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   Snoring 05/09/2017   Other allergic rhinitis 02/21/2015    PCP: Corwin Levins, MD  REFERRING PROVIDER: Julieanne Cotton, PA-C  REFERRING DIAG: [phone] (ICD-10-CM) - S/P total knee arthroplasty, right  THERAPY DIAG:  Muscle weakness (generalized)  Other abnormalities of gait and mobility  Acute pain of right knee  Stiffness of right knee, not elsewhere classified  Rationale for Evaluation and Treatment:  Rehabilitation  ONSET DATE: 04/08/23 R TKA  SUBJECTIVE:   SUBJECTIVE STATEMENT: [name] PA ordered the back MRI.  His wife rubs oil medication on back which helps.  He slept better last night.  Knee is doing well but hip is marginal.   PERTINENT HISTORY: L TKA  PAIN:  Are you having pain? Yes: NPRS scale:  R hip 1/10 highest in last week 4-5/10 mainly at night Pain location: R hip Pain description: nagging R lateral/posterior hip Aggravating factors: CPM, standing Relieving factors: Massager helps a little  PAIN:  Are you having pain? Yes: NPRS scale:   0 more stiffness Pain location: right knee Pain description: Tightness Aggravating factors:  Nothing Relieving factors: ice  PRECAUTIONS: None  RED FLAGS: None   WEIGHT BEARING RESTRICTIONS: No  FALLS:  Has patient fallen in last 6 months? No  LIVING ENVIRONMENT: Lives with: lives with their spouse Lives in: House/apartment Stairs: Yes: Internal: 18 steps; can reach both and External: 4 steps; on left going up Has following equipment at home: None  OCCUPATION: Retired -- Works part time for [employer] during baseball season (April)  PLOF: Independent  PATIENT GOALS: Return to normal activities without pain  NEXT MD VISIT: 05/20/23  OBJECTIVE:  Note: Objective measures were completed at Evaluation unless otherwise noted.  DIAGNOSTIC FINDINGS: n/a  PATIENT SURVEYS:  The Patient-Specific Functional Scale  Initial:  I am going to ask you to identify up to 3 important activities that you are unable to do or are having difficulty with as a result of this problem.  Today are there any activities that you are unable to do or having difficulty with because of this?  (Patient shown scale and patient rated each activity)  Follow up: When you first came in you had difficulty performing these activities.  Today do you still have difficulty?  Patient-Specific activity scoring scheme (Point to one number):  0 1 2 3 4 5 6 7 8 9  10 Unable                                                                                                          Able to perform To perform                                                                                                    activity at the same Activity         Level as before  Injury or problem Activity Initial (eval): 05/16/2023 Discharge:  Stepping down 3 9   2.   Putting socks/shoes on  5 10   3.   Sleep without hip discomfort 2 4   4. Driving 0 (hasn't tested it yet) 10   AVERAGE 2.5 8.25      COGNITION: Overall cognitive status: Within functional limits for tasks assessed    SENSATION: "Just a little numbness across the top on R"  EDEMA:  Circumferential: 36.5 cm L; 39 cm R  MUSCLE LENGTH: Hamstrings: did not assess Thomas test: Right appears WFL  POSTURE: rounded shoulders and increased thoracic kyphosis  PALPATION: Tightness in R quad and glute med  LOWER EXTREMITY ROM:  Active ROM Right eval Left eval Right 04/29/23 Right 05/10/23 Right 05/13/23  Knee flexion 95 112 A: 105 A: 103, AAROM 110 A: 115 AA: 122  Knee extension -7 0 A: -3 (seated LAQ) A: -3 LAQ     (Blank rows = not tested)  LOWER EXTREMITY MMT:  MMT Right eval Left eval Left 05/16/23 Right  05/16/23  Hip flexion 4 5    Hip extension 4 4    Hip abduction 3+ 5    Knee flexion 5 5    Knee extension 5 5 HH Dynameter 67.5#, 65.6# HH Dynameter 60.9#, 67.2# RLE:LLE 95.7% RLE 28.4% of body weight  Ankle dorsiflexion 5 5     (Blank rows = not tested)   FUNCTIONAL TESTS:  5 times sit to stand: 11.97 sec Functional gait assessment: 23/30   GAIT: Distance walked: Into clinic Assistive device utilized: None Level of assistance: Complete Independence Comments: Reciprocal pattern, mildly antalgic with knee slightly flexed during stance phase, unable to fully extend knee during swing                                                                                                                                TREATMENT DATE:  05/16/2023 Therapeutic Exercise: Recumbent bike: 30 sec flexion stretch,  2 min backwards full revolutions, then forward level 3 6 min.  Hamstring stretch with strap 30 sec 3 reps during rests on leg press Gastroc stretch on step heel depression 30 sec 3 reps; during rests no UE support heel raises BLEs 10 reps 1st set and BLE concentric RLE isometric eccentric 10 reps PT educated on ongoing HEP to include muscle endurance, flexibility, strength & balance. Pt  verbalized understanding.   Therapeutic Activities: Leg press BLEs 125# 15 reps 3 sets;  RLE only 87# 15 reps 2 sets Tandem stance on foam beam RLE in front and in back 30 sec ea Walking forward & side stepping on foam beam including transition floor to foam 3 laps ea Stepping up & down (over) BOSU round side up with 2 step lead with BLEs (RLE raises and controlled landing) 10 reps ea LE with no UE support. RLE Lateral step up on BOSU round side up reaching LLE across  ant & post with light BUE support 10 reps.  Pt reports no hip pain.    TREATMENT DATE:  05/13/23 TherEx Recumbent bike seat 7 x 8 min; partial revolutions Knee flexion stretch 20 x 10 sec hold on Rt; foot onto 8" step Knee extension stretch 10 x 10 sec with Rt foot on 8" step and overpressure at distal thigh Rt LAQ 10# 3x10; 5 sec hold AA heel slides x 10 reps   TherAct Leg press BLEs 125# 10 reps 3 sets;  RLE only 75# 10 reps 2 sets then 87# x10 reps Forward step ups onto 8" block x 10 bil Lateral step ups onto 8" block x 10 bil   05/10/23 Scifit seat 9 x4 minutes, progressing to seat 8 full rotations x6 minutes ( x10 min total) for ROM, w/u   ROM check  (see above) Bridges + progressive knee flexion 3x5 sets Supine quad/hip flexor stretch R LE 3x30 seconds  Single leg bridge 1x10 B   Knee flexion stretch 8 inch box 15x5 seconds Forward step ups 6 inch box x12  Forward step downs 4 inch box 2x12 R  Forward lunges onto BOSU R LE x12 Lateral lunges onto BOSU R LE x12  STS with R LE staggered back/L LE forward x10 from standard chair  SLS with one foot on bosu/one foot on solid surface 1x30 seconds B     05/08/2023: Therapeutic Exercise: Recumbent bike seat 8 with flexion stretch for 1 min, then level 1 full revolutions 9 min with no noted hip compensation. Hamstring stretch strap SLR 30 sec hold 3 reps during rest of leg press. Quad stretch hooklying with strap knee flexion with PT cues on position to limit  hip rotation 30 sec hold 3 reps. Hamstring curl green Tband 10 reps controlled eccentric motion Bridge with verbal / tactile cues for level pelvis 10 reps LAQ 5# 10 reps  Therapeutic Activities: PT verbal & demo education on positioning in sidelying including "tenting" sheets off feet. PT discussed new shoes with assessment for best design for him.  His shoes are >29 years old which could be a contributing factor. Pt verbalized understanding.  Leg press BLEs 125# 10 reps 2 sets;  RLE only 62# 10 reps 2 sets. PT cued on proper motion without hip rotation.    PATIENT EDUCATION:  Education details: Exam findings, POC, initial HEP, sleep positioning / trial of heat to decrease hip discomfort Person educated: Patient Education method: Explanation, Demonstration, and Handouts Education comprehension: verbalized understanding, returned demonstration, and needs further education  HOME EXERCISE PROGRAM: Access Code: [email or web address] URL: [email or web address] Date: 04/25/2023 Prepared by: Vernon Prey April Kirstie Peri  Exercises - Supine Quadriceps Stretch with Strap on Table  - 1 x daily - 7 x weekly - 2 sets - 30 sec hold - Supine Piriformis Stretch with Foot on Ground  - 1 x daily - 7 x weekly - 2 sets - 30 sec hold - Clamshell with Resistance  - 1 x daily - 7 x weekly - 2 sets - 10 reps - Squat with Chair Touch and Resistance Loop  - 1 x daily - 7 x weekly - 2 sets - 10 reps - Hip Abduction with Resistance Loop  - 1 x daily - 7 x weekly - 2 sets - 10 reps - Hip Extension with Resistance Loop  - 1 x daily - 7 x weekly - 2 sets - 10 reps - Prone Knee Flexion  - 1 x daily - 7 x  weekly - 2 sets - 10 reps - Prone Quadriceps Set  - 1 x daily - 7 x weekly - 2 sets - 10 reps  ASSESSMENT: 's  CLINICAL IMPRESSION: Patient has made excellent progress with knee recovery following his TKA.  His hip pain continues to limit his mobility.  Patient is going to [facility] 2-3 x/wk in addition to HEP and  appears to have a good understanding of a well-rounded fitness plan.  Patient met 3 LTGs today.  He appears close to discharge regarding his knee.    OBJECTIVE IMPAIRMENTS: Abnormal gait, decreased activity tolerance, decreased balance, decreased endurance, decreased mobility, difficulty walking, decreased ROM, decreased strength, hypomobility, increased edema, increased fascial restrictions, increased muscle spasms, impaired flexibility, impaired sensation, improper body mechanics, postural dysfunction, and pain.   ACTIVITY LIMITATIONS: carrying, lifting, bending, standing, squatting, sleeping, stairs, transfers, bed mobility, bathing, dressing, locomotion level, and caring for others  PARTICIPATION LIMITATIONS: meal prep, cleaning, driving, shopping, and community activity  PERSONAL FACTORS: Age, Fitness, Past/current experiences, and Time since onset of injury/illness/exacerbation are also affecting patient's functional outcome.   REHAB POTENTIAL: Good  CLINICAL DECISION MAKING: Evolving/moderate complexity  EVALUATION COMPLEXITY: Moderate   GOALS: Goals reviewed with patient? Yes  SHORT TERM GOALS: Target date: 05/23/2023  Patient will be independent with initial HEP. Baseline: Newly provided Goal status: MET 05/16/2023  2.  Pt will have improved R knee ROM from 5 to 105 deg Baseline: 7 to 95 deg Goal status: MET  05/16/2023   LONG TERM GOALS: Target date: 06/20/2023  Patient will be independent with advanced/ongoing HEP to improve outcomes and carryover.  Baseline:  Goal status: Ongoing  05/16/2023  2.  Patient will report at least 75% improvement in hip and knee pain to improve QOL. Baseline: 2-6 out of 10 R hip pain Goal status: Ongoing  05/16/2023  3.  Patient will demonstrate improved R knee AROM to >/= 0-110 deg to allow for normal gait and stair mechanics. Baseline: 7-95 Goal status: MET  05/16/2023  4.  Patient will demonstrate improved functional LE strength as  demonstrated by ability to perform single leg step downs from 6" step x10 without hand held assist for stairs. Baseline: Has to use bilat rails to maintain, gets pulling/discomfort in front of knee Goal status:   Ongoing  05/16/2023  5.  Patient will be able to ambulate 1000' with LRAD and normal gait pattern without increased pain to access community.  Baseline: Just started to walk without a/d 04/23/23 around the house Goal status: MET  05/16/2023 per patient report  6.  Patient will have >/=5.5 point average in PSFS to demonstrate MCID for improved functional ability. Baseline: 2.5 Goal status: MET  05/16/2023  7.  Patient will demonstrate at least 28/30 on FGA to decrease risk of falls. Baseline: 23/30 Goal status: Ongoing  05/16/2023    PLAN:  PT FREQUENCY: 2x/week  PT DURATION: 8 weeks  PLANNED INTERVENTIONS: 97164- PT Re-evaluation, 97110-Therapeutic exercises, 97530- Therapeutic activity, 97112- Neuromuscular re-education, 97535- Self Care, 40981- Manual therapy, [phone]- Gait training, [phone]- Aquatic Therapy, [phone]- Electrical stimulation (unattended), 97016- Vasopneumatic device, [id number]- Ultrasound, 65784- Ionotophoresis 4mg /ml Dexamethasone, Patient/Family education, Balance training, Stair training, Taping, Dry Needling, Joint mobilization, Spinal mobilization, Cryotherapy, and Moist heat  PLAN FOR NEXT SESSION:  check MD note,  assess remaining  LTGs with discussion for discharge,  functional & balance activities.   Vladimir Faster, PT, DPT 05/16/2023, 1:53 PM

## 2023-05-20 ENCOUNTER — Encounter: Payer: Self-pay | Admitting: Orthopedic Surgery

## 2023-05-20 ENCOUNTER — Ambulatory Visit (INDEPENDENT_AMBULATORY_CARE_PROVIDER_SITE_OTHER): Payer: 59 | Admitting: Physical Therapy

## 2023-05-20 ENCOUNTER — Ambulatory Visit (INDEPENDENT_AMBULATORY_CARE_PROVIDER_SITE_OTHER): Payer: 59 | Admitting: Orthopedic Surgery

## 2023-05-20 ENCOUNTER — Encounter: Payer: Self-pay | Admitting: Physical Therapy

## 2023-05-20 DIAGNOSIS — R2689 Other abnormalities of gait and mobility: Secondary | ICD-10-CM | POA: Diagnosis not present

## 2023-05-20 DIAGNOSIS — M6281 Muscle weakness (generalized): Secondary | ICD-10-CM

## 2023-05-20 DIAGNOSIS — M25661 Stiffness of right knee, not elsewhere classified: Secondary | ICD-10-CM | POA: Diagnosis not present

## 2023-05-20 DIAGNOSIS — M25551 Pain in right hip: Secondary | ICD-10-CM

## 2023-05-20 DIAGNOSIS — M545 Low back pain, unspecified: Secondary | ICD-10-CM

## 2023-05-20 DIAGNOSIS — M25561 Pain in right knee: Secondary | ICD-10-CM

## 2023-05-20 NOTE — Therapy (Signed)
 OUTPATIENT PHYSICAL THERAPY TREATMENT DISCHARGE SUMMARY   Patient Name: Patrick [name] MRN: [id number] DOB:1960-10-12, 63 y.o., male Today's Date: 05/20/2023  END OF SESSION:  PT End of Session - 05/20/23 1429     Visit Number 8    Number of Visits 16    Date for PT Re-Evaluation 06/20/23    Authorization Type [employer]    Authorization Time Period no copay    Authorization - Number of Visits 30    PT Start Time 1430    PT Stop Time 1455    PT Time Calculation (min) 25 min    Activity Tolerance Patient tolerated treatment well    Behavior During Therapy WFL for tasks assessed/performed                    Past Medical History:  Diagnosis Date   Allergy    Arthritis    left knee   Dysrhythmia    " [name]"   Heart murmur    History of hiatal hernia    4 years ago per patient- no surgery needed   History of kidney stones    Hx of adenomatous polyp of colon    Hyperlipidemia    Hypertension    Past Surgical History:  Procedure Laterality Date   APPENDECTOMY     Arthroscopic knee surgeries on both knees     CHOLECYSTECTOMY     COLONOSCOPY     NASAL SEPTOPLASTY W/ TURBINOPLASTY Bilateral 04/13/2019   Procedure: NASAL SEPTOPLASTY WITH BILATERAL TURBINATE REDUCTION;  Surgeon: Newman Pies, MD;  Location: [facility];  Service: ENT;  Laterality: Bilateral;   TOTAL KNEE ARTHROPLASTY Left 01/21/2020   Procedure: LEFT TOTAL KNEE ARTHROPLASTY;  Surgeon: Cammy Copa, MD;  Location: [facility] OR;  Service: Orthopedics;  Laterality: Left;   TOTAL KNEE ARTHROPLASTY Right 04/08/2023   Procedure: RIGHT TOTAL KNEE ARTHROPLASTY;  Surgeon: Cammy Copa, MD;  Location: [facility] OR;  Service: Orthopedics;  Laterality: Right;   VASECTOMY     Patient Active Problem List   Diagnosis Date Noted   Arthritis of right knee 04/21/2023   S/P total knee replacement, right 04/08/2023   Ulnar neuropathy at elbow 01/10/2023   Hx of adenomatous  polyp of colon    B12 deficiency 09/02/2022   Asthmatic bronchitis 06/07/2022   Back pain 01/18/2022   Calcification of patellar tendon determined by X-ray 05/15/2021   Coronary artery calcification 12/08/2020   Melanocytic nevi of trunk 08/18/2020   Sebaceous cyst 08/18/2020   Seborrheic keratosis 08/18/2020   S/P total knee arthroplasty, 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   Acute bursitis of right shoulder 02/02/2019   Sinusitis 11/06/2018   Penicillin allergy 11/06/2018   Hip flexor tightness 09/29/2018   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   Hyperglycemia 08/02/2018   HLD (hyperlipidemia) 08/02/2018   Vitamin D deficiency 08/02/2018   Encounter for well adult exam with abnormal findings 07/30/2018   HTN (hypertension) 07/30/2018   Tinnitus aurium, 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   Snoring 05/09/2017   Other allergic rhinitis 02/21/2015    PCP: Corwin Levins, MD  REFERRING PROVIDER: Julieanne Cotton, PA-C  REFERRING DIAG: [phone] (ICD-10-CM) - S/P total knee arthroplasty, right  THERAPY DIAG:  Muscle weakness (generalized)  Other abnormalities of gait and mobility  Acute pain of right knee  Stiffness of right knee, not elsewhere classified  Rationale for Evaluation and Treatment: Rehabilitation  ONSET DATE: 04/08/23  R TKA  SUBJECTIVE:   SUBJECTIVE STATEMENT: Plans to d/c today; getting MRI of lower back and hip this weekend.  PERTINENT HISTORY: L TKA  PAIN:  Are you having pain? Yes: NPRS scale:  R hip 1/10 highest in last week 4-5/10 mainly at night Pain location: R hip Pain description: nagging R lateral/posterior hip Aggravating factors: CPM, standing Relieving factors: Massager helps a little  PAIN:  Are you having pain? Yes: NPRS scale:   0 more stiffness Pain location: right knee Pain description: Tightness Aggravating factors: Nothing Relieving factors: ice  PRECAUTIONS: None  RED FLAGS: None   WEIGHT BEARING  RESTRICTIONS: No  FALLS:  Has patient fallen in last 6 months? No  LIVING ENVIRONMENT: Lives with: lives with their spouse Lives in: House/apartment Stairs: Yes: Internal: 18 steps; can reach both and External: 4 steps; on left going up Has following equipment at home: None  OCCUPATION: Retired -- Works part time for [employer] during baseball season (April)  PLOF: Independent  PATIENT GOALS: Return to normal activities without pain  NEXT MD VISIT: 05/20/23  OBJECTIVE:  Note: Objective measures were completed at Evaluation unless otherwise noted.  DIAGNOSTIC FINDINGS: n/a  PATIENT SURVEYS:  The Patient-Specific Functional Scale  Initial:  I am going to ask you to identify up to 3 important activities that you are unable to do or are having difficulty with as a result of this problem.  Today are there any activities that you are unable to do or having difficulty with because of this?  (Patient shown scale and patient rated each activity)  Follow up: When you first came in you had difficulty performing these activities.  Today do you still have difficulty?  Patient-Specific activity scoring scheme (Point to one number):  0 1 2 3 4 5 6 7 8 9  10 Unable                                                                                                          Able to perform To perform                                                                                                    activity at the same Activity         Level as before  Injury or problem Activity Initial (eval): 05/16/2023 Discharge:  Stepping down 3 9   2.   Putting socks/shoes on  5 10   3.   Sleep without hip discomfort 2 4   4. Driving 0 (hasn't tested it yet) 10   AVERAGE 2.5 8.25     COGNITION: Overall cognitive status: Within functional limits for tasks assessed    SENSATION: "Just a  little numbness across the top on R"  EDEMA:  Circumferential: 36.5 cm L; 39 cm R  MUSCLE LENGTH: Hamstrings: did not assess Thomas test: Right appears WFL  POSTURE: rounded shoulders and increased thoracic kyphosis  PALPATION: Tightness in R quad and glute med  LOWER EXTREMITY ROM:  Active ROM Right eval Left eval Right 04/29/23 Right 05/10/23 Right 05/13/23  Knee flexion 95 112 A: 105 A: 103, AAROM 110 A: 115 AA: 122  Knee extension -7 0 A: -3 (seated LAQ) A: -3 LAQ     (Blank rows = not tested)  LOWER EXTREMITY MMT:  MMT Right eval Left eval Left 05/16/23 Right  05/16/23  Hip flexion 4 5    Hip extension 4 4    Hip abduction 3+ 5    Knee flexion 5 5    Knee extension 5 5 HH Dynameter 67.5#, 65.6# HH Dynameter 60.9#, 67.2# RLE:LLE 95.7% RLE 28.4% of body weight  Ankle dorsiflexion 5 5     (Blank rows = not tested)   FUNCTIONAL TESTS:  5 times sit to stand: 11.97 sec Functional gait assessment: 23/30  05/20/23: FGA 30/30    [facility] PT Assessment - 05/20/23 1442       Functional Gait  Assessment   Gait assessed  Yes    Gait Level Surface Walks 20 ft in less than 5.5 sec, no assistive devices, good speed, no evidence for imbalance, normal gait pattern, deviates no more than 6 in outside of the 12 in walkway width.    Change in Gait Speed Able to smoothly change walking speed without loss of balance or gait deviation. Deviate no more than 6 in outside of the 12 in walkway width.    Gait with Horizontal Head Turns Performs head turns smoothly with no change in gait. Deviates no more than 6 in outside 12 in walkway width    Gait with Vertical Head Turns Performs head turns with no change in gait. Deviates no more than 6 in outside 12 in walkway width.    Gait and Pivot Turn Pivot turns safely within 3 sec and stops quickly with no loss of balance.    Step Over Obstacle Is able to step over 2 stacked shoe boxes taped together (9 in total height) without changing gait  speed. No evidence of imbalance.    Gait with Narrow Base of Support Is able to ambulate for 10 steps heel to toe with no staggering.    Gait with Eyes Closed Walks 20 ft, no assistive devices, good speed, no evidence of imbalance, normal gait pattern, deviates no more than 6 in outside 12 in walkway width. Ambulates 20 ft in less than 7 sec.    Ambulating Backwards Walks 20 ft, no assistive devices, good speed, no evidence for imbalance, normal gait    Steps Alternating feet, no rail.    Total Score 30             GAIT: Distance walked: Into clinic Assistive device utilized: None Level of assistance: Complete Independence Comments: Reciprocal pattern, mildly antalgic with knee slightly  flexed during stance phase, unable to fully extend knee during swing                                                                                                                                TREATMENT DATE:  05/20/23 TherEx Recumbent bike L5 x 6 min Discussed current gym and home program; no concerns and pt without any questions or concerns   TherAct Step downs with RLE on 6" step x 10 reps; without LOB or instability noted  Physical Performance Test FGA performed - see above for details with review of results   05/16/2023 Therapeutic Exercise: Recumbent bike: 30 sec flexion stretch,  2 min backwards full revolutions, then forward level 3 6 min.  Hamstring stretch with strap 30 sec 3 reps during rests on leg press Gastroc stretch on step heel depression 30 sec 3 reps; during rests no UE support heel raises BLEs 10 reps 1st set and BLE concentric RLE isometric eccentric 10 reps PT educated on ongoing HEP to include muscle endurance, flexibility, strength & balance. Pt verbalized understanding.   Therapeutic Activities: Leg press BLEs 125# 15 reps 3 sets;  RLE only 87# 15 reps 2 sets Tandem stance on foam beam RLE in front and in back 30 sec ea Walking forward & side stepping on foam beam  including transition floor to foam 3 laps ea Stepping up & down (over) BOSU round side up with 2 step lead with BLEs (RLE raises and controlled landing) 10 reps ea LE with no UE support. RLE Lateral step up on BOSU round side up reaching LLE across ant & post with light BUE support 10 reps.  Pt reports no hip pain.     05/13/23 TherEx Recumbent bike seat 7 x 8 min; partial revolutions Knee flexion stretch 20 x 10 sec hold on Rt; foot onto 8" step Knee extension stretch 10 x 10 sec with Rt foot on 8" step and overpressure at distal thigh Rt LAQ 10# 3x10; 5 sec hold AA heel slides x 10 reps   TherAct Leg press BLEs 125# 10 reps 3 sets;  RLE only 75# 10 reps 2 sets then 87# x10 reps Forward step ups onto 8" block x 10 bil Lateral step ups onto 8" block x 10 bil   05/10/23 Scifit seat 9 x4 minutes, progressing to seat 8 full rotations x6 minutes ( x10 min total) for ROM, w/u   ROM check  (see above) Bridges + progressive knee flexion 3x5 sets Supine quad/hip flexor stretch R LE 3x30 seconds  Single leg bridge 1x10 B   Knee flexion stretch 8 inch box 15x5 seconds Forward step ups 6 inch box x12  Forward step downs 4 inch box 2x12 R  Forward lunges onto BOSU R LE x12 Lateral lunges onto BOSU R LE x12  STS with R LE staggered back/L LE forward x10 from standard chair  SLS with one foot on bosu/one foot  on solid surface 1x30 seconds B     05/08/2023: Therapeutic Exercise: Recumbent bike seat 8 with flexion stretch for 1 min, then level 1 full revolutions 9 min with no noted hip compensation. Hamstring stretch strap SLR 30 sec hold 3 reps during rest of leg press. Quad stretch hooklying with strap knee flexion with PT cues on position to limit hip rotation 30 sec hold 3 reps. Hamstring curl green Tband 10 reps controlled eccentric motion Bridge with verbal / tactile cues for level pelvis 10 reps LAQ 5# 10 reps  Therapeutic Activities: PT verbal & demo education on positioning in  sidelying including "tenting" sheets off feet. PT discussed new shoes with assessment for best design for him.  His shoes are >7 years old which could be a contributing factor. Pt verbalized understanding.  Leg press BLEs 125# 10 reps 2 sets;  RLE only 62# 10 reps 2 sets. PT cued on proper motion without hip rotation.    PATIENT EDUCATION:  Education details: Exam findings, POC, initial HEP, sleep positioning / trial of heat to decrease hip discomfort Person educated: Patient Education method: Explanation, Demonstration, and Handouts Education comprehension: verbalized understanding, returned demonstration, and needs further education  HOME EXERCISE PROGRAM: Access Code: [email or web address] URL: [email or web address] Date: 04/25/2023 Prepared by: Vernon Prey April Kirstie Peri  Exercises - Supine Quadriceps Stretch with Strap on Table  - 1 x daily - 7 x weekly - 2 sets - 30 sec hold - Supine Piriformis Stretch with Foot on Ground  - 1 x daily - 7 x weekly - 2 sets - 30 sec hold - Clamshell with Resistance  - 1 x daily - 7 x weekly - 2 sets - 10 reps - Squat with Chair Touch and Resistance Loop  - 1 x daily - 7 x weekly - 2 sets - 10 reps - Hip Abduction with Resistance Loop  - 1 x daily - 7 x weekly - 2 sets - 10 reps - Hip Extension with Resistance Loop  - 1 x daily - 7 x weekly - 2 sets - 10 reps - Prone Knee Flexion  - 1 x daily - 7 x weekly - 2 sets - 10 reps - Prone Quadriceps Set  - 1 x daily - 7 x weekly - 2 sets - 10 reps  ASSESSMENT:  CLINICAL IMPRESSION: Pt has met all goals except Rt hip pain goal; which he is now undergoing additional testing to help with these symptoms.  Overall he is ready for d/c regarding the knee.  Will d/c PT today.   OBJECTIVE IMPAIRMENTS: Abnormal gait, decreased activity tolerance, decreased balance, decreased endurance, decreased mobility, difficulty walking, decreased ROM, decreased strength, hypomobility, increased edema, increased fascial  restrictions, increased muscle spasms, impaired flexibility, impaired sensation, improper body mechanics, postural dysfunction, and pain.   ACTIVITY LIMITATIONS: carrying, lifting, bending, standing, squatting, sleeping, stairs, transfers, bed mobility, bathing, dressing, locomotion level, and caring for others  PARTICIPATION LIMITATIONS: meal prep, cleaning, driving, shopping, and community activity  PERSONAL FACTORS: Age, Fitness, Past/current experiences, and Time since onset of injury/illness/exacerbation are also affecting patient's functional outcome.   REHAB POTENTIAL: Good  CLINICAL DECISION MAKING: Evolving/moderate complexity  EVALUATION COMPLEXITY: Moderate   GOALS: Goals reviewed with patient? Yes  SHORT TERM GOALS: Target date: 05/23/2023  Patient will be independent with initial HEP. Baseline: Newly provided Goal status: MET 05/16/2023  2.  Pt will have improved R knee ROM from 5 to 105 deg Baseline: 7 to 95 deg Goal status: MET  05/16/2023   LONG TERM GOALS: Target date: 06/20/2023  Patient will be independent with advanced/ongoing HEP to improve outcomes and carryover.  Baseline:  Goal status: MET 05/20/23  2.  Patient will report at least 75% improvement in hip and knee pain to improve QOL. Baseline: 2-6 out of 10 R hip pain Goal status: Partially Met (met in knee) 05/20/23  3.  Patient will demonstrate improved R knee AROM to >/= 0-110 deg to allow for normal gait and stair mechanics. Baseline: 7-95 Goal status: MET  05/16/2023  4.  Patient will demonstrate improved functional LE strength as demonstrated by ability to perform single leg step downs from 6" step x10 without hand held assist for stairs. Baseline: Has to use bilat rails to maintain, gets pulling/discomfort in front of knee Goal status:   MET 05/20/23  5.  Patient will be able to ambulate 1000' with LRAD and normal gait pattern without increased pain to access community.  Baseline: Just started to  walk without a/d 04/23/23 around the house Goal status: MET  05/16/2023 per patient report  6.  Patient will have >/=5.5 point average in PSFS to demonstrate MCID for improved functional ability. Baseline: 2.5 Goal status: MET  05/16/2023  7.  Patient will demonstrate at least 28/30 on FGA to decrease risk of falls. Baseline: 23/30 Goal status: MET 05/20/23    PLAN:  PT FREQUENCY: 2x/week  PT DURATION: 8 weeks  PLANNED INTERVENTIONS: 97164- PT Re-evaluation, 97110-Therapeutic exercises, 97530- Therapeutic activity, 97112- Neuromuscular re-education, 97535- Self Care, 16109- Manual therapy, [phone]- Gait training, [phone]- Aquatic Therapy, [phone]- Electrical stimulation (unattended), 97016- Vasopneumatic device, 97035- Ultrasound, 29562- Ionotophoresis 4mg /ml Dexamethasone, Patient/Family education, Balance training, Stair training, Taping, Dry Needling, Joint mobilization, Spinal mobilization, Cryotherapy, and Moist heat  PLAN FOR NEXT SESSION: d/c PT today    Clarita Crane, PT, DPT 05/20/23 3:11 PM   PHYSICAL THERAPY DISCHARGE SUMMARY  Visits from Start of Care: 8  Current functional level related to goals / functional outcomes: See above   Remaining deficits: See above   Education / Equipment: HEP   Patient agrees to discharge. Patient goals were met. Patient is being discharged due to meeting the stated rehab goals.  Clarita Crane, PT, DPT 05/20/23 3:11 PM  [facility] [address] [place], [place], [address] Phone: [phone]   Fax:  [phone]

## 2023-05-20 NOTE — Progress Notes (Unsigned)
 Post-Op Visit Note   Patient: Patrick [name]           Date of Birth: 1960/04/07           MRN: [id number] Visit Date: 05/20/2023 PCP: Corwin Levins, MD   Assessment & Plan:  Chief Complaint:  Chief Complaint  Patient presents with   Right Hip - Follow-up   Right Knee - Routine Post Op    04/08/23 Right TKA   Visit Diagnoses:  1. Pain in right hip   2. Low back pain, unspecified back pain laterality, unspecified chronicity, unspecified whether sciatica present     Plan: Patrick [name] is a 63 year old patient who is now about 7 weeks out right total knee replacement.  He has been doing well.  Did have a right hip injection 05/03/2023 which did not give him much relief.  He has been doing physical therapy upstairs and has done well.  Stairs are okay for him.  Does report a little bit of a knot in his back.  Has been doing the bike at the gym.  Back pain is bad at night.  Does take gabapentin at night for his symptoms.  On examination he has excellent range of motion of the right knee with good stability and stable ligaments.  Does have some groin pain as well as trochanteric pain on the right-hand side.  He does have L-spine MRI scheduled for 322.  I think it would be good to get an MRI scan of the hip to look at the musculature around the right hip.  Hard to say if this is intrinsic hip pathology versus lumbar spine pathology.  He has had it going on for longer than 6 weeks and has failed conservative management including therapy as well as injection.  Would also like for him to go ahead and get scheduled with Dr. Alvester Morin for 3 days after his MRI scan on 322 for a lumbar spine epidural steroid injection.  Follow-Up Instructions: No follow-ups on file.   Orders:  Orders Placed This Encounter  Procedures   MR Hip Right w/o contrast   Ambulatory referral to Physical Medicine Rehab   No orders of the defined types were placed in this encounter.   Imaging: No results found.  PMFS  History: Patient Active Problem List   Diagnosis Date Noted   Arthritis of right knee 04/21/2023   S/P total knee replacement, right 04/08/2023   Ulnar neuropathy at elbow 01/10/2023   Hx of adenomatous polyp of colon    B12 deficiency 09/02/2022   Asthmatic bronchitis 06/07/2022   Back pain 01/18/2022   Calcification of patellar tendon determined by X-ray 05/15/2021   Coronary artery calcification 12/08/2020   Melanocytic nevi of trunk 08/18/2020   Sebaceous cyst 08/18/2020   Seborrheic keratosis 08/18/2020   S/P total knee arthroplasty, 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   Acute bursitis of right shoulder 02/02/2019   Sinusitis 11/06/2018   Penicillin allergy 11/06/2018   Hip flexor tightness 09/29/2018   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   Hyperglycemia 08/02/2018   HLD (hyperlipidemia) 08/02/2018   Vitamin D deficiency 08/02/2018   Encounter for well adult exam with abnormal findings 07/30/2018   HTN (hypertension) 07/30/2018   Tinnitus aurium, 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   Snoring 05/09/2017   Other allergic rhinitis 02/21/2015   Past Medical History:  Diagnosis Date   Allergy    Arthritis    left knee   Dysrhythmia    " Pt states he  has an extra heart beat at times"   Heart murmur    History of hiatal hernia    4 years ago per patient- no surgery needed   History of kidney stones    Hx of adenomatous polyp of colon    Hyperlipidemia    Hypertension     Family History  Adopted: Yes  Problem Relation Age of Onset   Colon cancer Neg Hx    Colon polyps Neg Hx    Esophageal cancer Neg Hx    Rectal cancer Neg Hx    Stomach cancer Neg Hx     Past Surgical History:  Procedure Laterality Date   APPENDECTOMY     Arthroscopic knee surgeries on both knees     CHOLECYSTECTOMY     COLONOSCOPY     NASAL SEPTOPLASTY W/ TURBINOPLASTY Bilateral 04/13/2019   Procedure: NASAL SEPTOPLASTY WITH BILATERAL TURBINATE REDUCTION;  Surgeon: Newman Pies,  MD;  Location: [facility];  Service: ENT;  Laterality: Bilateral;   TOTAL KNEE ARTHROPLASTY Left 01/21/2020   Procedure: LEFT TOTAL KNEE ARTHROPLASTY;  Surgeon: Cammy Copa, MD;  Location: [facility] OR;  Service: Orthopedics;  Laterality: Left;   TOTAL KNEE ARTHROPLASTY Right 04/08/2023   Procedure: RIGHT TOTAL KNEE ARTHROPLASTY;  Surgeon: Cammy Copa, MD;  Location: [facility] OR;  Service: Orthopedics;  Laterality: Right;   VASECTOMY     Social History   Occupational History   Occupation: Teacher, early years/pre, Network engineer, Semi-retired  Tobacco Use   Smoking status: Former    Types: Cigars, Cigarettes   Smokeless tobacco: Never   Tobacco comments:    one cigar every few years  Vaping Use   Vaping status: Never Used  Substance and Sexual Activity   Alcohol use: Yes    Alcohol/week: 5.0 standard drinks of alcohol    Types: 5 Standard drinks or equivalent per week    Comment: socially   Drug use: No   Sexual activity: Yes

## 2023-05-21 ENCOUNTER — Encounter: Payer: Self-pay | Admitting: Orthopedic Surgery

## 2023-05-21 ENCOUNTER — Ambulatory Visit (INDEPENDENT_AMBULATORY_CARE_PROVIDER_SITE_OTHER): Payer: 59 | Admitting: Family Medicine

## 2023-05-21 ENCOUNTER — Other Ambulatory Visit: Payer: Self-pay

## 2023-05-21 ENCOUNTER — Encounter: Payer: Self-pay | Admitting: Family Medicine

## 2023-05-21 VITALS — BP 140/72 | HR 103 | Ht 70.0 in | Wt 228.0 lb

## 2023-05-21 DIAGNOSIS — M545 Low back pain, unspecified: Secondary | ICD-10-CM

## 2023-05-21 DIAGNOSIS — G5622 Lesion of ulnar nerve, left upper limb: Secondary | ICD-10-CM

## 2023-05-21 DIAGNOSIS — M25551 Pain in right hip: Secondary | ICD-10-CM

## 2023-05-21 DIAGNOSIS — M25522 Pain in left elbow: Secondary | ICD-10-CM | POA: Diagnosis not present

## 2023-05-21 MED ORDER — GABAPENTIN 100 MG PO CAPS: 200.0000 mg | ORAL_CAPSULE | Freq: Three times a day (TID) | ORAL | 1 refills | Status: DC | PRN

## 2023-05-21 NOTE — Assessment & Plan Note (Addendum)
 Bilateral but seems to be now worse on the right than the left. Patient was doing worse previously on the contralateral side but given the injection.  Does have some inflammation but not quite as severe as what it was previously.  Discussed which activities to do and which ones to avoid.  Increase activity slowly as well.  Increase icing regimen and home exercises.  Follow-up again in 6 to 8 weeks.  Worsening pain consider the injection on the right did increase gabapentin to up to 200 mg 3 times a day as needed.

## 2023-05-21 NOTE — Patient Instructions (Addendum)
 Everything moving in right direction 200mg  TID prn We are here if you have questions See me in 2-3 months

## 2023-05-22 ENCOUNTER — Encounter: Payer: Self-pay | Admitting: Orthopedic Surgery

## 2023-05-22 ENCOUNTER — Encounter: Payer: Self-pay | Admitting: Surgical

## 2023-05-23 ENCOUNTER — Encounter: Payer: 59 | Admitting: Physical Therapy

## 2023-05-24 ENCOUNTER — Encounter: Payer: Self-pay | Admitting: Orthopedic Surgery

## 2023-05-24 NOTE — Telephone Encounter (Signed)
 Thx Terri

## 2023-05-25 ENCOUNTER — Other Ambulatory Visit

## 2023-05-26 ENCOUNTER — Other Ambulatory Visit

## 2023-05-27 ENCOUNTER — Encounter: Payer: 59 | Admitting: Physical Therapy

## 2023-05-27 ENCOUNTER — Encounter: Payer: Self-pay | Admitting: Internal Medicine

## 2023-05-27 MED ORDER — FLUTICASONE PROPIONATE 50 MCG/ACT NA SUSP: 2.0000 | Freq: Every day | NASAL | 6 refills | Status: DC

## 2023-05-27 NOTE — Telephone Encounter (Signed)
 Please have him go to 1 visit with physical therapy where he can learn a home exercise program and then have him follow-up in 6 weeks and this is per his insurance and not per me.  Thanks

## 2023-05-30 ENCOUNTER — Encounter: Payer: 59 | Admitting: Physical Therapy

## 2023-06-13 ENCOUNTER — Encounter: Payer: Self-pay | Admitting: Physical Therapy

## 2023-06-13 ENCOUNTER — Ambulatory Visit (INDEPENDENT_AMBULATORY_CARE_PROVIDER_SITE_OTHER): Admitting: Physical Therapy

## 2023-06-13 DIAGNOSIS — M5459 Other low back pain: Secondary | ICD-10-CM

## 2023-06-13 DIAGNOSIS — M6281 Muscle weakness (generalized): Secondary | ICD-10-CM | POA: Diagnosis not present

## 2023-06-13 NOTE — Therapy (Signed)
 OUTPATIENT PHYSICAL THERAPY EVALUATION   Patient Name: Shawan Corella MRN: [id number] DOB:1960-09-10, 63 y.o., male Today's Date: 06/13/2023  END OF SESSION:  PT End of Session - 06/13/23 1019     Visit Number 1    Number of Visits 6    Date for PT Re-Evaluation 07/25/23    Authorization Type [employer]; 16 visits remaining    Authorization Time Period no copay    Authorization - Visit Number 1    Authorization - Number of Visits 16    PT Start Time 1015    PT Stop Time 1055    PT Time Calculation (min) 40 min    Activity Tolerance Patient tolerated treatment well;Patient limited by pain    Behavior During Therapy WFL for tasks assessed/performed             Past Medical History:  Diagnosis Date   Allergy    Arthritis    left knee   Dysrhythmia    " [name]"   Heart murmur    History of hiatal hernia    4 years ago per patient- no surgery needed   History of kidney stones    Hx of adenomatous polyp of colon    Hyperlipidemia    Hypertension    Past Surgical History:  Procedure Laterality Date   APPENDECTOMY     Arthroscopic knee surgeries on both knees     CHOLECYSTECTOMY     COLONOSCOPY     NASAL SEPTOPLASTY W/ TURBINOPLASTY Bilateral 04/13/2019   Procedure: NASAL SEPTOPLASTY WITH BILATERAL TURBINATE REDUCTION;  Surgeon: Newman Pies, MD;  Location: [facility];  Service: ENT;  Laterality: Bilateral;   TOTAL KNEE ARTHROPLASTY Left 01/21/2020   Procedure: LEFT TOTAL KNEE ARTHROPLASTY;  Surgeon: Cammy Copa, MD;  Location: [facility] OR;  Service: Orthopedics;  Laterality: Left;   TOTAL KNEE ARTHROPLASTY Right 04/08/2023   Procedure: RIGHT TOTAL KNEE ARTHROPLASTY;  Surgeon: Cammy Copa, MD;  Location: [facility] OR;  Service: Orthopedics;  Laterality: Right;   VASECTOMY     Patient Active Problem List   Diagnosis Date Noted   Arthritis of right knee 04/21/2023   S/P total knee replacement, right 04/08/2023   Ulnar  neuropathy at elbow 01/10/2023   Hx of adenomatous polyp of colon    B12 deficiency 09/02/2022   Asthmatic bronchitis 06/07/2022   Back pain 01/18/2022   Calcification of patellar tendon determined by X-ray 05/15/2021   Coronary artery calcification 12/08/2020   Melanocytic nevi of trunk 08/18/2020   Sebaceous cyst 08/18/2020   Seborrheic keratosis 08/18/2020   S/P total knee arthroplasty, 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   Acute bursitis of right shoulder 02/02/2019   Sinusitis 11/06/2018   Penicillin allergy 11/06/2018   Hip flexor tightness 09/29/2018   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   Hyperglycemia 08/02/2018   HLD (hyperlipidemia) 08/02/2018   Vitamin D deficiency 08/02/2018   Encounter for well adult exam with abnormal findings 07/30/2018   HTN (hypertension) 07/30/2018   Tinnitus aurium, 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   Snoring 05/09/2017   Other allergic rhinitis 02/21/2015    PCP: Corwin Levins, MD  REFERRING PROVIDER: Cammy Copa, MD  REFERRING DIAG: M54.50 (ICD-10-CM) - Low back pain, unspecified back pain laterality, unspecified chronicity, unspecified whether sciatica present M25.551 (ICD-10-CM) - Pain in right hip  Rationale for Evaluation and Treatment: Rehabilitation  THERAPY DIAG:  Muscle weakness (generalized) - Plan: PT plan of  care cert/re-cert  Other low back pain - Plan: PT plan of care cert/re-cert  ONSET DATE: Feb 2025   SUBJECTIVE:                                                                                                                                                                                           SUBJECTIVE STATEMENT: Pt reporting continued pain in Rt hip and low back pain which has been going on for several months.  He does report some mild improvement but pain is persistent.    PERTINENT HISTORY:  Bil TKA, HTN, HLD  PAIN:  Are you having pain? Yes: NPRS scale: 1-2/10 Pain location:  lower back, Rt side, Rt hip Pain description: pressure, stiffness Aggravating factors: lying on Rt side, standing, sitting Relieving factors: medication  PRECAUTIONS:  None  RED FLAGS: None   WEIGHT BEARING RESTRICTIONS:  No  FALLS:  Has patient fallen in last 6 months? No  LIVING ENVIRONMENT: Lives with: lives with their spouse Lives in: House/apartment Stairs: Yes: Internal: 18 steps; can reach both and External: 4 steps; on left going up   OCCUPATION:  Part-time work for [employer]  PLOF:  Independent and Leisure: goes to [employer], golfing  PATIENT GOALS:  Improve pain   OBJECTIVE:  Note: Objective measures were completed at Evaluation unless otherwise noted.  DIAGNOSTIC FINDINGS:  X-rays: Loss of lordosis is present.  There is moderate facet arthritis noted at multiple levels.   Mild to moderate disc space narrowing noted at multiple levels primarily  at L3-L4 and L5-S1.  There are degenerative osteophytes noted off of the  majority of the endplates of the lumbar spine.  Spondylolisthesis noted at  L2-L3 and L3-L4 which is grade 1.  PATIENT SURVEYS:  Patient-Specific Activity Scoring Scheme  "0" represents "unable to perform." "10" represents "able to perform at prior level. 0 1 2 3 4 5 6 7 8 9  10 (Date and Score)   Activity Eval     1. Standing for long periods 5    2. Sleeping in comfortable position 5    3. Sitting without discomfort 5   4. Not using meds to address pain 5   Score 5    Total score = sum of the activity scores/number of activities Minimum detectable change (90%CI) for average score = 2 points Minimum detectable change (90%CI) for single activity score = 3 points    COGNITIVE STATUS: Within functional limits for tasks assessed   SENSATION: WFL  POSTURE:  decreased lumbar lordosis and decreased thoracic kyphosis   GAIT: 06/13/23 Comments: amb independently without significant deviations  noted  PALPATION: 06/13/23 active trigger point in Rt glute med; hypomobility of thoracic and lumbar spine; tightness noted in Rt piriformis   LUMBAR ROM:   ROM A/PROM  eval  Flexion WNL  Extension WNL  Right lateral flexion WNL  Left lateral flexion WNL  Right rotation WNL  Left rotation WNL   (Blank rows = not tested)      LOWER EXTREMITY MMT:    MMT Right eval Left eval  Hip flexion 5/5 (with Rt hip pain) 5/5  Hip extension 4/5 4/5  Hip abduction 4/5 5/5   (Blank rows = not tested)     TREATMENT:                                                                                                                              DATE:  06/13/23 TherEx See HEP - demonstrated with trial reps performed PRN, min cues for comprehension    Self Care Educated on PT POC and clinical findings      PATIENT EDUCATION:  Education details: HEP Person educated: Patient Education method: Programmer, multimedia, Facilities manager, and Handouts Education comprehension: verbalized understanding, returned demonstration, and needs further education  HOME EXERCISE PROGRAM: Access Code: [email or web address] URL: [email or web address] Date: 06/13/2023 Prepared by: Moshe Cipro  Exercises - Supine Piriformis Stretch with Foot on Ground  - 2 x daily - 7 x weekly - 1 sets - 3 reps - 30 sec hold - Hooklying Single Knee to Chest  - 2 x daily - 7 x weekly - 1 sets - 3 reps - 30 sec hold - Supine Lower Trunk Rotation  - 2 x daily - 7 x weekly - 1 sets - 3 reps - 30 sec hold - Standing Thoracic Open Book at Wall  - 2 x daily - 7 x weekly - 1 sets - 10 reps - 5-10 sec hold - Standing Lumbar Extension at Wall - Forearms  - 2 x daily - 7 x weekly - 1 sets - 10 reps - 5-10 sec hold - Self Release   - 2 x daily - 7 x weekly - 1 sets - 1 reps - 3-5 min hold   ASSESSMENT:  CLINICAL IMPRESSION: Patient is a 63 y.o. male who was seen today for physical therapy evaluation and treatment for LBP. He  demonstrates mild strength deficits, postural abnormalities and continued pain affecting functional mobility.  He will benefit from PT to address deficits listed.     OBJECTIVE IMPAIRMENTS: decreased mobility, decreased strength, hypomobility, increased fascial restrictions, increased muscle spasms, postural dysfunction, and pain.   ACTIVITY LIMITATIONS: sitting, standing, sleeping, bed mobility, and locomotion level  PARTICIPATION LIMITATIONS: cleaning, laundry, driving, shopping, community activity, occupation, and yard work  PERSONAL FACTORS: 3+ comorbidities: Bil TKA, HTN, HLD  are also affecting patient's functional outcome.   REHAB POTENTIAL: Good  CLINICAL DECISION MAKING: Evolving/moderate complexity  EVALUATION COMPLEXITY: Moderate   GOALS: Goals reviewed with patient? Yes  SHORT TERM GOALS: Target date:  07/04/2023  Independent with initial HEP Goal status: INITIAL   LONG TERM GOALS: Target date: 07/25/2023  Independent with final HEP Goal status: INITIAL  2.  PSFS score improved by 2 points Goal status: INITIAL  3.  Rt hip strength improved to 5/5 for improved function and mobility Goal status: INIITAL  4.  Report pain < 1/10 with standing and walking > 1 hour for improved function Goal status: INITIAL   PLAN:  PT FREQUENCY:  will see up to 1x/wk PRN, plan to see every 2-3 weeks  PT DURATION: 6 weeks  PLANNED INTERVENTIONS: 97164- PT Re-evaluation, 97750- Physical Performance Testing, 97110-Therapeutic exercises, 97530- Therapeutic activity, 97112- Neuromuscular re-education, 97535- Self Care, 16109- Manual therapy, [id number]- Aquatic Therapy, U0454- Electrical stimulation (unattended), [phone]- Traction (mechanical), Patient/Family education, Balance training, Taping, Dry Needling, Joint mobilization, Spinal manipulation, Spinal mobilization, Cryotherapy, and Moist heat.  PLAN FOR NEXT SESSION: Review HEP, continue with hip/core strengthening, lumbar mobility and  flexibility   NEXT MD VISIT: After MRI   Clarita Crane, PT, DPT 06/13/23 11:23 AM

## 2023-07-03 ENCOUNTER — Encounter: Admitting: Physical Therapy

## 2023-07-05 ENCOUNTER — Telehealth: Payer: Self-pay

## 2023-07-05 ENCOUNTER — Encounter: Payer: Self-pay | Admitting: Physical Therapy

## 2023-07-05 ENCOUNTER — Ambulatory Visit (INDEPENDENT_AMBULATORY_CARE_PROVIDER_SITE_OTHER): Admitting: Physical Therapy

## 2023-07-05 DIAGNOSIS — M6281 Muscle weakness (generalized): Secondary | ICD-10-CM

## 2023-07-05 DIAGNOSIS — R2689 Other abnormalities of gait and mobility: Secondary | ICD-10-CM | POA: Diagnosis not present

## 2023-07-05 DIAGNOSIS — M5459 Other low back pain: Secondary | ICD-10-CM | POA: Diagnosis not present

## 2023-07-05 NOTE — Telephone Encounter (Signed)
-----   Message from Marley Simmers sent at 07/05/2023  9:30 AM EDT ----- Seen for 2 visits, no change in symptoms and doing HEP consistently.  Hopefully this will satisfy insurance for an MRI.  He should have an appt with you coming up in the next couple of weeks.  Thanks- Trevor Fudge

## 2023-07-05 NOTE — Telephone Encounter (Signed)
 Do you want me to go ahead and proceed with reordering scan?

## 2023-07-05 NOTE — Telephone Encounter (Signed)
 Yes.  Thank you Barbra Ley and Trevor Fudge

## 2023-07-05 NOTE — Therapy (Addendum)
 OUTPATIENT PHYSICAL THERAPY TREATMENT DISCHARGE SUMMARY   Patient Name: Patrick [name] MRN: [id number] DOB:03-31-1960, 63 y.o., male Today's Date: 10/23/2023  END OF SESSION:     Past Medical History:  Diagnosis Date   Allergy     Arthritis    left knee   Dysrhythmia     [name]   Heart murmur    History of hiatal hernia    4 years ago per patient- no surgery needed   History of kidney stones    Hx of adenomatous polyp of colon    Hyperlipidemia    Hypertension    Past Surgical History:  Procedure Laterality Date   APPENDECTOMY     Arthroscopic knee surgeries on both knees     CHOLECYSTECTOMY     COLONOSCOPY     NASAL SEPTOPLASTY W/ TURBINOPLASTY Bilateral 04/13/2019   Procedure: NASAL SEPTOPLASTY WITH BILATERAL TURBINATE REDUCTION;  Surgeon: Karis Clunes, MD;  Location: [facility];  Service: ENT;  Laterality: Bilateral;   TOTAL KNEE ARTHROPLASTY Left 01/21/2020   Procedure: LEFT TOTAL KNEE ARTHROPLASTY;  Surgeon: Addie Cordella Hamilton, MD;  Location: [facility] OR;  Service: Orthopedics;  Laterality: Left;   TOTAL KNEE ARTHROPLASTY Right 04/08/2023   Procedure: RIGHT TOTAL KNEE ARTHROPLASTY;  Surgeon: Addie Cordella Hamilton, MD;  Location: [facility] OR;  Service: Orthopedics;  Laterality: Right;   VASECTOMY     Patient Active Problem List   Diagnosis Date Noted   Encounter for well adult exam with abnormal findings 09/04/2023   S/P total knee replacement, right 04/08/2023   Ulnar neuropathy at elbow 01/10/2023   B12 deficiency 09/02/2022   Back pain 01/18/2022   Calcification of patellar tendon determined by X-ray 05/15/2021   Coronary artery calcification 12/08/2020   Melanocytic nevi of trunk 08/18/2020   Sebaceous cyst 08/18/2020   Seborrheic keratosis 08/18/2020   S/P total knee arthroplasty, 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   Acute bursitis of right shoulder 02/02/2019   Sinusitis 11/06/2018    Penicillin allergy  11/06/2018   Hip flexor tightness 09/29/2018   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   Hyperglycemia 08/02/2018   HLD (hyperlipidemia) 08/02/2018   Vitamin D  deficiency 08/02/2018   HTN (hypertension) 07/30/2018   Tinnitus aurium, 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   Snoring 05/09/2017   Other allergic rhinitis 02/21/2015    PCP: Norleen Lynwood ORN, MD  REFERRING PROVIDER: Addie Cordella Hamilton, MD  REFERRING DIAG: M54.50 (ICD-10-CM) - Low back pain, unspecified back pain laterality, unspecified chronicity, unspecified whether sciatica present M25.551 (ICD-10-CM) - Pain in right hip  Rationale for Evaluation and Treatment: Rehabilitation  THERAPY DIAG:  Muscle weakness (generalized)  Other low back pain  Other abnormalities of gait and mobility  ONSET DATE: Feb 2025   SUBJECTIVE:  SUBJECTIVE STATEMENT: Feels about the same. Doing his exercises. Pain still up to 2/10 with walking  PERTINENT HISTORY:  Bil TKA, HTN, HLD  PAIN:  Are you having pain? Yes: NPRS scale: 1-2/10 Pain location: lower back, Rt side, Rt hip Pain description: pressure, stiffness Aggravating factors: lying on Rt side, standing, sitting Relieving factors: medication  PRECAUTIONS:  None  RED FLAGS: None   WEIGHT BEARING RESTRICTIONS:  No  FALLS:  Has patient fallen in last 6 months? No  LIVING ENVIRONMENT: Lives with: lives with their spouse Lives in: House/apartment Stairs: Yes: Internal: 18 steps; can reach both and External: 4 steps; on left going up   OCCUPATION:  Part-time work for [employer]  PLOF:  Independent and Leisure: goes to [employer], golfing  PATIENT GOALS:  Improve pain   OBJECTIVE:  Note: Objective measures were completed at Evaluation unless otherwise noted.  DIAGNOSTIC  FINDINGS:  X-rays: Loss of lordosis is present.  There is moderate facet arthritis noted at multiple levels.   Mild to moderate disc space narrowing noted at multiple levels primarily  at L3-L4 and L5-S1.  There are degenerative osteophytes noted off of the  majority of the endplates of the lumbar spine.  Spondylolisthesis noted at  L2-L3 and L3-L4 which is grade 1.  PATIENT SURVEYS:  Patient-Specific Activity Scoring Scheme  0 represents "unable to perform." 10 represents "able to perform at prior level. 0 1 2 3 4 5 6 7 8 9  10 (Date and Score)   Activity Eval  07/05/23   1. Standing for long periods 5  7  2. Sleeping in comfortable position 5  6  3. Sitting without discomfort 5 3  4. Not using meds to address pain 5 0  Score 5 4   Total score = sum of the activity scores/number of activities Minimum detectable change (90%CI) for average score = 2 points Minimum detectable change (90%CI) for single activity score = 3 points    COGNITIVE STATUS: Within functional limits for tasks assessed   SENSATION: WFL  POSTURE:  decreased lumbar lordosis and decreased thoracic kyphosis   GAIT: 06/13/23 Comments: amb independently without significant deviations noted   PALPATION: 06/13/23 active trigger point in Rt glute med; hypomobility of thoracic and lumbar spine; tightness noted in Rt piriformis   LUMBAR ROM:   ROM A/PROM  eval  Flexion WNL  Extension WNL  Right lateral flexion WNL  Left lateral flexion WNL  Right rotation WNL  Left rotation WNL   (Blank rows = not tested)      LOWER EXTREMITY MMT:    MMT Right eval Left eval Right 07/05/23  Hip flexion 5/5 (with Rt hip pain) 5/5 4/5 (Rt hip pain)  Hip extension 4/5 4/5 5/5  Hip abduction 4/5 5/5 5/5   (Blank rows = not tested)     TREATMENT:  DATE:  07/05/23 TherEx NuStep L7 x 8  min Lower trunk rotation 2x30 sec bil Supine piriformis stretch 2x30 sec bil Supine single knee to chest 2x30 sec bil Bridges x 10 reps; 5 sec hold MMT - see above for details Standing lumbar extension 10 x 5 sec hol Standing thoracic trunk rotation x 10 reps bil  06/13/23 TherEx See HEP - demonstrated with trial reps performed PRN, min cues for comprehension    Self Care Educated on PT POC and clinical findings      PATIENT EDUCATION:  Education details: HEP Person educated: Patient Education method: Programmer, multimedia, Facilities manager, and Handouts Education comprehension: verbalized understanding, returned demonstration, and needs further education  HOME EXERCISE PROGRAM: Access Code: [email or web address] URL: [email or web address] Date: 06/13/2023 Prepared by: Corean Ku  Exercises - Supine Piriformis Stretch with Foot on Ground  - 2 x daily - 7 x weekly - 1 sets - 3 reps - 30 sec hold - Hooklying Single Knee to Chest  - 2 x daily - 7 x weekly - 1 sets - 3 reps - 30 sec hold - Supine Lower Trunk Rotation  - 2 x daily - 7 x weekly - 1 sets - 3 reps - 30 sec hold - Standing Thoracic Open Book at Wall  - 2 x daily - 7 x weekly - 1 sets - 10 reps - 5-10 sec hold - Standing Lumbar Extension at Wall - Forearms  - 2 x daily - 7 x weekly - 1 sets - 10 reps - 5-10 sec hold - Self Release   - 2 x daily - 7 x weekly - 1 sets - 1 reps - 3-5 min hold   ASSESSMENT:  CLINICAL IMPRESSION: Pt with all goals ongoing at this time with really no significant change in symptoms or function.  Plan for pt to return to MD and hopefully be able to proceed with MRI for further diagnostic testing.  Will hold PT.    OBJECTIVE IMPAIRMENTS: decreased mobility, decreased strength, hypomobility, increased fascial restrictions, increased muscle spasms, postural dysfunction, and pain.   ACTIVITY LIMITATIONS: sitting, standing, sleeping, bed mobility, and locomotion level  PARTICIPATION  LIMITATIONS: cleaning, laundry, driving, shopping, community activity, occupation, and yard work  PERSONAL FACTORS: 3+ comorbidities: Bil TKA, HTN, HLD are also affecting patient's functional outcome.   REHAB POTENTIAL: Good  CLINICAL DECISION MAKING: Evolving/moderate complexity  EVALUATION COMPLEXITY: Moderate   GOALS: Goals reviewed with patient? Yes  SHORT TERM GOALS: Target date: 07/04/2023  Independent with initial HEP Goal status: MET 07/05/23   LONG TERM GOALS: Target date: 07/25/2023  Independent with final HEP Goal status: ONGOING 07/05/23  2.  PSFS score improved by 2 points Goal status:  ONGOING 07/05/23  3.  Rt hip strength improved to 5/5 for improved function and mobility Goal status:  ONGOING 07/05/23  4.  Report pain < 1/10 with standing and walking > 1 hour for improved function Goal status:  ONGOING 07/05/23   PLAN:  PT FREQUENCY: will see up to 1x/wk PRN, plan to see every 2-3 weeks  PT DURATION: 6 weeks  PLANNED INTERVENTIONS: 97164- PT Re-evaluation, 97750- Physical Performance Testing, 97110-Therapeutic exercises, 97530- Therapeutic activity, 97112- Neuromuscular re-education, 97535- Self Care, 02859- Manual therapy, [id number]- Aquatic Therapy, H9716- Electrical stimulation (unattended), [phone]- Traction (mechanical), Patient/Family education, Balance training, Taping, Dry Needling, Joint mobilization, Spinal manipulation, Spinal mobilization, Cryotherapy, and Moist heat.  PLAN FOR NEXT SESSION: hold PT for now  NEXT MD VISIT: After MRI   Corean JULIANNA Ku, PT,  DPT 10/23/23 12:01 PM    PHYSICAL THERAPY DISCHARGE SUMMARY  Visits from Start of Care: 2  Current functional level related to goals / functional outcomes: See above   Remaining deficits: See above   Education / Equipment: HEP   Patient agrees to discharge. Patient goals were not met. Patient is being discharged due to not returning since the last visit.  Corean JULIANNA Ku, PT,  DPT 10/23/23 12:01 PM  [facility] [address] [place], [place], [address] Phone: [phone]   Fax:  [phone]

## 2023-07-10 NOTE — Telephone Encounter (Signed)
 Patrick [name] doing this one

## 2023-07-11 ENCOUNTER — Telehealth: Payer: Self-pay | Admitting: Orthopedic Surgery

## 2023-07-11 NOTE — Telephone Encounter (Signed)
 Patient called and said he is following up on MRI request that got denied. ZO#[phone]

## 2023-07-12 ENCOUNTER — Telehealth (HOSPITAL_BASED_OUTPATIENT_CLINIC_OR_DEPARTMENT_OTHER): Payer: Self-pay | Admitting: Orthopedic Surgery

## 2023-07-12 NOTE — Telephone Encounter (Signed)
 Called and advised pt.

## 2023-07-12 NOTE — Telephone Encounter (Signed)
 Patient states that he got denied for his MRI but since got an approval and is just wanting to know the status of him being able to get his MRI before his MRI with Dr Rozelle Corning. Best contact number for patient [phone]

## 2023-07-12 NOTE — Telephone Encounter (Signed)
**Note De-identified  [place] Obfuscation** Please advise 

## 2023-07-15 NOTE — Telephone Encounter (Signed)
 I called patient LMVM that Lsp MRI approved, Hip MRI still pending.  I left ph # for [facility] so he can call and sched Lsp MRI for now.

## 2023-07-17 ENCOUNTER — Ambulatory Visit: Admitting: Orthopedic Surgery

## 2023-07-18 ENCOUNTER — Ambulatory Visit
Admission: RE | Admit: 2023-07-18 | Discharge: 2023-07-18 | Disposition: A | Discharge status: Home / Self Care | Source: Ambulatory Visit | Attending: Surgical | Admitting: Surgical

## 2023-07-18 DIAGNOSIS — M48061 Spinal stenosis, lumbar region without neurogenic claudication: Secondary | ICD-10-CM | POA: Diagnosis not present

## 2023-07-18 DIAGNOSIS — M545 Low back pain, unspecified: Secondary | ICD-10-CM

## 2023-07-22 ENCOUNTER — Ambulatory Visit: Admitting: Family Medicine

## 2023-07-28 ENCOUNTER — Ambulatory Visit
Admission: RE | Admit: 2023-07-28 | Discharge: 2023-07-28 | Disposition: A | Discharge status: Home / Self Care | Source: Ambulatory Visit | Attending: Orthopedic Surgery | Admitting: Orthopedic Surgery

## 2023-07-28 DIAGNOSIS — M25551 Pain in right hip: Secondary | ICD-10-CM | POA: Diagnosis not present

## 2023-07-28 DIAGNOSIS — M1611 Unilateral primary osteoarthritis, right hip: Secondary | ICD-10-CM | POA: Diagnosis not present

## 2023-07-31 ENCOUNTER — Ambulatory Visit (INDEPENDENT_AMBULATORY_CARE_PROVIDER_SITE_OTHER): Admitting: Orthopedic Surgery

## 2023-07-31 ENCOUNTER — Encounter: Payer: Self-pay | Admitting: Orthopedic Surgery

## 2023-07-31 DIAGNOSIS — M25551 Pain in right hip: Secondary | ICD-10-CM | POA: Diagnosis not present

## 2023-07-31 DIAGNOSIS — M545 Low back pain, unspecified: Secondary | ICD-10-CM | POA: Diagnosis not present

## 2023-07-31 NOTE — Progress Notes (Signed)
 Office Visit Note   Patient: Patrick [name]           Date of Birth: 1961-01-14           MRN: [id number] Visit Date: 07/31/2023 Requested by: Roslyn Coombe, MD [address] [place],  [place] [address] PCP: Roslyn Coombe, MD  Subjective: Chief Complaint  Patient presents with   Other    Review lumbar and hip MRI scans    HPI: Patrick [name] is a 63 y.o. male who presents to the office reporting after having lumbar spine and hip MRI scans.  Denies any groin pain.  Does have some bilateral thigh thigh numbness.  Feels tight all the time.  Especially after standing.  Focal lower back pain.  Hard for him to ride in the car.  Does have 10 pounds difference in abduction strength right versus left.  He is back in the gym at [facility] well.  Also has been in the pool.  Taking gabapentin .  Hip MRI scan looks pretty reasonable.  No atrophy in the hip musculature.  No effusion or any type of arthritis in the hip joint itself.  Lumbar spine MRI on my review shows multiple levels of moderate spinal stenosis..                ROS: All systems reviewed are negative as they relate to the chief complaint within the history of present illness.  Patient denies fevers or chills.  Assessment & Plan: Visit Diagnoses:  1. Low back pain, unspecified back pain laterality, unspecified chronicity, unspecified whether sciatica present   2. Pain in right hip     Plan: Impression is low back and bilateral thigh pain consistent with moderate stenosis.  He is doing physical therapy and taking gabapentin .  I think would be reasonable to try an injection with Dr. Daisey Dryer to see if it can help him.  Otherwise this is something he may have to live with.  He also is having bilateral hand numbness which sounds like carpal tunnel.  EMG nerve study may be pending after he sees his sports medicine physician in June.  Follow-Up Instructions: No follow-ups on file.   Orders:  No orders of the defined types were placed in this  encounter.  No orders of the defined types were placed in this encounter.     Procedures: No procedures performed   Clinical Data: No additional findings.  Objective: Vital Signs: There were no vitals taken for this visit.  Physical Exam:  Constitutional: Patient appears well-developed HEENT:  Head: Normocephalic Eyes:EOM are normal Neck: Normal range of motion Cardiovascular: Normal rate Pulmonary/chest: Effort normal Neurologic: Patient is alert Skin: Skin is warm Psychiatric: Patient has normal mood and affect  Ortho Exam: Ortho exam demonstrates excellent range of motion both knees to 115.  Slightly more swelling in the right knee region compared to the left.  Trace effusion right knee no effusion left knee.  Very good hip abduction adduction flexion extension strength and manual muscle testing.  No nerve root tension signs.  Specialty Comments:  No specialty comments available.  Imaging: No results found.   PMFS History: Patient Active Problem List   Diagnosis Date Noted   Arthritis of right knee 04/21/2023   S/P total knee replacement, right 04/08/2023   Ulnar neuropathy at elbow 01/10/2023   Hx of adenomatous polyp of colon    B12 deficiency 09/02/2022   Asthmatic bronchitis 06/07/2022   Back pain 01/18/2022   Calcification of  patellar tendon determined by X-ray 05/15/2021   Coronary artery calcification 12/08/2020   Melanocytic nevi of trunk 08/18/2020   Sebaceous cyst 08/18/2020   Seborrheic keratosis 08/18/2020   S/P total knee arthroplasty, 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   Acute bursitis of right shoulder 02/02/2019   Sinusitis 11/06/2018   Penicillin allergy  11/06/2018   Hip flexor tightness 09/29/2018   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   Hyperglycemia 08/02/2018   HLD (hyperlipidemia) 08/02/2018   Vitamin D  deficiency 08/02/2018   Encounter for well adult exam with abnormal findings 07/30/2018   HTN  (hypertension) 07/30/2018   Tinnitus aurium, 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   Snoring 05/09/2017   Other allergic rhinitis 02/21/2015   Past Medical History:  Diagnosis Date   Allergy     Arthritis    left knee   Dysrhythmia    " [name]"   Heart murmur    History of hiatal hernia    4 years ago per patient- no surgery needed   History of kidney stones    Hx of adenomatous polyp of colon    Hyperlipidemia    Hypertension     Family History  Adopted: Yes  Problem Relation Age of Onset   Colon cancer Neg Hx    Colon polyps Neg Hx    Esophageal cancer Neg Hx    Rectal cancer Neg Hx    Stomach cancer Neg Hx     Past Surgical History:  Procedure Laterality Date   APPENDECTOMY     Arthroscopic knee surgeries on both knees     CHOLECYSTECTOMY     COLONOSCOPY     NASAL SEPTOPLASTY W/ TURBINOPLASTY Bilateral 04/13/2019   Procedure: NASAL SEPTOPLASTY WITH BILATERAL TURBINATE REDUCTION;  Surgeon: Reynold Caves, MD;  Location: [facility];  Service: ENT;  Laterality: Bilateral;   TOTAL KNEE ARTHROPLASTY Left 01/21/2020   Procedure: LEFT TOTAL KNEE ARTHROPLASTY;  Surgeon: Jasmine Mesi, MD;  Location: [facility] OR;  Service: Orthopedics;  Laterality: Left;   TOTAL KNEE ARTHROPLASTY Right 04/08/2023   Procedure: RIGHT TOTAL KNEE ARTHROPLASTY;  Surgeon: Jasmine Mesi, MD;  Location: [facility] OR;  Service: Orthopedics;  Laterality: Right;   VASECTOMY     Social History   Occupational History   Occupation: Teacher, early years/pre, Network engineer, Semi-retired  Tobacco Use   Smoking status: Former    Types: Cigars, Cigarettes   Smokeless tobacco: Never   Tobacco comments:    one cigar every few years  Vaping Use   Vaping status: Never Used  Substance and Sexual Activity   Alcohol use: Yes    Alcohol/week: 5.0 standard drinks of alcohol    Types: 5 Standard drinks or equivalent per week    Comment: socially   Drug use: No   Sexual activity: Yes

## 2023-07-31 NOTE — Addendum Note (Signed)
 Addended by: Garrus Gauthreaux on: 07/31/2023 08:51 AM   Modules accepted: Orders

## 2023-08-14 ENCOUNTER — Ambulatory Visit: Admitting: Family Medicine

## 2023-08-29 ENCOUNTER — Ambulatory Visit: Admitting: Physical Medicine and Rehabilitation

## 2023-08-29 ENCOUNTER — Other Ambulatory Visit: Payer: Self-pay

## 2023-08-29 VITALS — BP 146/91 | HR 71

## 2023-08-29 DIAGNOSIS — M48062 Spinal stenosis, lumbar region with neurogenic claudication: Secondary | ICD-10-CM | POA: Diagnosis not present

## 2023-08-29 DIAGNOSIS — M5416 Radiculopathy, lumbar region: Secondary | ICD-10-CM | POA: Diagnosis not present

## 2023-08-29 MED ORDER — METHYLPREDNISOLONE ACETATE 40 MG/ML IJ SUSP
40.0000 mg | Freq: Once | INTRAMUSCULAR | Status: AC
  Administered 2023-08-29: 40 mg

## 2023-08-29 NOTE — Progress Notes (Signed)
 [name] [address] [place] [address] Phone: [phone] Subjective:   Patrick [name], am serving as a scribe for Dr. Arthea Claudene.  I'm seeing this patient by the request  of:  Norleen Lynwood ORN, MD  CC: left elbow pain   YEP:Dlagzrupcz  05/21/2023 Bilateral but seems to be now worse on the right than the left. Patient was doing worse previously on the contralateral side but given the injection.  Does have some inflammation but not quite as severe as what it was previously.  Discussed which activities to do and which ones to avoid.  Increase activity slowly as well.  Increase icing regimen and home exercises.  Follow-up again in 6 to 8 weeks.  Worsening pain consider the injection on the right did increase gabapentin  to up to 200 mg 3 times a day as needed.      Update 09/02/2023 Patrick [name] is a 63 y.o. male coming in with complaint of L elbow pain. Patient states back to original pain after making improvements. Also some numbness. Bilateral and both can be bad.      Past Medical History:  Diagnosis Date   Allergy     Arthritis    left knee   Dysrhythmia     [name]   Heart murmur    History of hiatal hernia    4 years ago per patient- no surgery needed   History of kidney stones    Hx of adenomatous polyp of colon    Hyperlipidemia    Hypertension    Past Surgical History:  Procedure Laterality Date   APPENDECTOMY     Arthroscopic knee surgeries on both knees     CHOLECYSTECTOMY     COLONOSCOPY     NASAL SEPTOPLASTY W/ TURBINOPLASTY Bilateral 04/13/2019   Procedure: NASAL SEPTOPLASTY WITH BILATERAL TURBINATE REDUCTION;  Surgeon: Karis Clunes, MD;  Location:  SURGERY CENTER;  Service: ENT;  Laterality: Bilateral;   TOTAL KNEE ARTHROPLASTY Left 01/21/2020   Procedure: LEFT TOTAL KNEE ARTHROPLASTY;  Surgeon: Addie Cordella Hamilton, MD;  Location: [facility] OR;  Service: Orthopedics;  Laterality:  Left;   TOTAL KNEE ARTHROPLASTY Right 04/08/2023   Procedure: RIGHT TOTAL KNEE ARTHROPLASTY;  Surgeon: Addie Cordella Hamilton, MD;  Location: [facility] OR;  Service: Orthopedics;  Laterality: Right;   VASECTOMY     Social History   Socioeconomic History   Marital status: Married    Spouse name: Not on file   Number of children: Not on file   Years of education: Not on file   Highest education level: Not on file  Occupational History   Occupation: Teacher, early years/pre, Network engineer, Semi-retired  Tobacco Use   Smoking status: Former    Types: Cigars, Cigarettes   Smokeless tobacco: Never   Tobacco comments:    one cigar every few years  Vaping Use   Vaping status: Never Used  Substance and Sexual Activity   Alcohol use: Yes    Alcohol/week: 5.0 standard drinks of alcohol    Types: 5 Standard drinks or equivalent per week    Comment: socially   Drug use: No   Sexual activity: Yes  Other Topics Concern   Not on file  Social History Narrative   Married.   Education: [employer]   Exercise: Yes   Social Drivers of Corporate investment banker Strain: Not on file  Food Insecurity: No Food Insecurity (04/08/2023)   Hunger Vital  Sign    Worried About Programme researcher, broadcasting/film/video in the Last Year: Never true    Ran Out of Food in the Last Year: Never true  Transportation Needs: No Transportation Needs (04/08/2023)   PRAPARE - Administrator, Civil Service (Medical): No    Lack of Transportation (Non-Medical): No  Physical Activity: Not on file  Stress: Not on file  Social Connections: Not on file   Allergies  Allergen Reactions   Penicillins Hives    Rash around neck; 04/08/23 tolerated cefazolin     Family History  Adopted: Yes  Problem Relation Age of Onset   Colon cancer Neg Hx    Colon polyps Neg Hx    Esophageal cancer Neg Hx    Rectal cancer Neg Hx    Stomach cancer Neg Hx      Current Outpatient Medications (Cardiovascular):    rosuvastatin  (CRESTOR ) 20 MG tablet, Take 1 tablet (20 mg  total) by mouth daily.   telmisartan  (MICARDIS ) 80 MG tablet, Take 1 tablet (80 mg total) by mouth daily.  Current Outpatient Medications (Respiratory):    cetirizine (ZYRTEC) 10 MG tablet, Take 10 mg by mouth at bedtime.    fluticasone  (FLONASE ) 50 MCG/ACT nasal spray, Place 2 sprays into both nostrils daily.    Current Outpatient Medications (Other):    Ascorbic Acid (VITAMIN C) 1000 MG tablet, Take 1,000 mg by mouth daily.   cholecalciferol (VITAMIN D3) 25 MCG (1000 UNIT) tablet, Take 1,000 Units by mouth daily.    gabapentin  (NEURONTIN ) 100 MG capsule, Take 2 capsules (200 mg total) by mouth 3 (three) times daily as needed.   Ginger, Zingiber officinalis, (GINGER ROOT) 550 MG CAPS, Take 550 mg by mouth daily.    Misc Natural Products (TART CHERRY ADVANCED) CAPS, Take 1 capsule by mouth daily.    Multiple Vitamin (MULTIVITAMIN WITH MINERALS) TABS tablet, Take 1 tablet by mouth daily. Men's Over 50    Turmeric Curcumin 500 MG CAPS, Take 500 mg by mouth daily.    Reviewed prior external information including notes and imaging from  primary care provider As well as notes that were available from care everywhere and other healthcare systems.  Past medical history, social, surgical and family history all reviewed in electronic medical record.  No pertanent information unless stated regarding to the chief complaint.   Review of Systems:  No headache, visual changes, nausea, vomiting, diarrhea, constipation, dizziness, abdominal pain, skin rash, fevers, chills, night sweats, weight loss, swollen lymph nodes, body aches, joint swelling, chest pain, shortness of breath, mood changes. POSITIVE muscle aches  Objective  There were no vitals taken for this visit.   General: No apparent distress alert and oriented x3 mood and affect normal, dressed appropriately.  HEENT: Pupils equal, extraocular movements intact  Respiratory: Patient's speak in full sentences and does not appear short of  breath  Cardiovascular: No lower extremity edema, non tender, no erythema  Left elbow pain tender to palpation still over the ulnar nerve.  Positive Tinel's over the cubital tunnel bilaterally.  Patient has relatively good grip strength noted.  No pain with Tinel's over the carpal tunnel.  Neck exam does have some limited sidebending.   Limited muscular skeletal ultrasound was performed and interpreted by CLAUDENE HUSSAR, M  Significant hypoechoic changes still noted of the median nerve.  Hypoechoic changes noted.   Impression and Recommendations:    The above documentation has been reviewed and is accurate and complete Aprill Banko M Lamar Naef, DO

## 2023-08-29 NOTE — Progress Notes (Signed)
 Patrick [name] - 63 y.o. male MRN [id number]  Date of birth: 1960-12-13  Office Visit Note: Visit Date: 08/29/2023 PCP: Norleen Lynwood ORN, MD Referred by: Norleen Lynwood ORN, MD  Subjective: Chief Complaint  Patient presents with   Lower Back - Pain   HPI:  Patrick [name] is a 63 y.o. male who comes in today at the request of Dr. JUDITHANN Glendia Hutchinson for planned Right L4-5 Lumbar Interlaminar epidural steroid injection with fluoroscopic guidance.  The patient has failed conservative care including home exercise, medications, time and activity modification.  This injection will be diagnostic and hopefully therapeutic.  Please see requesting physician notes for further details and justification. Consider facet joint blocks depending on results. Good RFA candidate.   ROS Otherwise per HPI.  Assessment & Plan: Visit Diagnoses:    ICD-10-CM   1. Lumbar radiculopathy  M54.16 XR C-ARM NO REPORT    Epidural Steroid injection    methylPREDNISolone  acetate (DEPO-MEDROL ) injection 40 mg    2. Spinal stenosis of lumbar region with neurogenic claudication  M48.062 methylPREDNISolone  acetate (DEPO-MEDROL ) injection 40 mg      Plan: No additional findings.   Meds & Orders:  Meds ordered this encounter  Medications   methylPREDNISolone  acetate (DEPO-MEDROL ) injection 40 mg    Orders Placed This Encounter  Procedures   XR C-ARM NO REPORT   Epidural Steroid injection    Follow-up: Return for visit to requesting provider as needed.   Procedures: No procedures performed  Lumbar Epidural Steroid Injection - Interlaminar Approach with Fluoroscopic Guidance  Patient: Patrick [name]      Date of Birth: [birth year] MRN: [id number] PCP: Norleen Lynwood ORN, MD      Visit Date: 08/29/2023   Universal Protocol:     Consent Given By: the patient  Position: PRONE  Additional Comments: Vital signs were monitored before and after the procedure. Patient was prepped and draped in the usual sterile fashion. The correct  patient, procedure, and site was verified.   Injection Procedure Details:   Procedure diagnoses: Lumbar radiculopathy [M54.16]   Meds Administered:  Meds ordered this encounter  Medications   methylPREDNISolone  acetate (DEPO-MEDROL ) injection 40 mg     Laterality: Right  Location/Site:  L4-5  Needle: 3.5 in., 20 ga. Tuohy  Needle Placement: Paramedian epidural  Findings:   -Comments: Excellent flow of contrast into the epidural space.  Procedure Details: Using a paramedian approach from the side mentioned above, the region overlying the inferior lamina was localized under fluoroscopic visualization and the soft tissues overlying this structure were infiltrated with 4 ml. of 1% Lidocaine  without Epinephrine . The Tuohy needle was inserted into the epidural space using a paramedian approach.   The epidural space was localized using loss of resistance along with counter oblique bi-planar fluoroscopic views.  After negative aspirate for air, blood, and CSF, a 2 ml. volume of Isovue-250 was injected into the epidural space and the flow of contrast was observed. Radiographs were obtained for documentation purposes.    The injectate was administered into the level noted above.   Additional Comments:  The patient tolerated the procedure well Dressing: 2 x 2 sterile gauze and Band-Aid    Post-procedure details: Patient was observed during the procedure. Post-procedure instructions were reviewed.  Patient left the clinic in stable condition.   Clinical History: MRI LUMBAR SPINE WITHOUT CONTRAST   TECHNIQUE: Multiplanar, multisequence MR imaging of the lumbar spine was performed. No intravenous contrast was administered.   COMPARISON:  Lumbar radiographs 05/10/2023.  FINDINGS: Segmentation:  Normal on the comparison.   Alignment: Stable since March. Lumbar lordosis with mild degenerative appearing retrolisthesis at both L3-L4 and L5-S1. Subtle anterolisthesis of L4 on  L5.   Vertebrae: Mild degenerative appearing endplate marrow edema at L1-L2, including associated with an L1 inferior endplate Schmorl's node (series 104, image 7). Normal background bone marrow signal. Maintained vertebral height. Intact visible sacrum. No other acute osseous abnormality.   Conus medullaris and cauda equina: Conus extends to the L1 level. No lower spinal cord or conus signal abnormality.   Paraspinal and other soft tissues: Negative.   Disc levels:   T11-T12: Mild facet hypertrophy.  No stenosis.   T12-L1:  Negative.   L1-L2: Disc space loss. Circumferential disc bulge asymmetric to the right. Small right paracentral annular fissure of the disc on series 106, image 10. Mild spinal stenosis. Mild L1 neural foraminal stenosis.   L2-L3: Mild circumferential disc bulge. Mild facet and ligament flavum hypertrophy. Mild spinal stenosis. No convincing foraminal stenosis.   L3-L4: Mild retrolisthesis and disc space loss. Circumferential disc bulging with endplate spurring. Mild to moderate facet and ligament flavum hypertrophy. Mild to moderate spinal stenosis (series 106, image 22). Lateral recess involvement greater on the left (left L4 nerve level). Mild L3 neural foraminal stenosis which is fairly symmetric.   L4-L5: Subtle anterolisthesis with circumferential disc bulge and mild endplate spurring. Moderate facet and ligament flavum hypertrophy. No convincing spinal or lateral recess stenosis. Mild to moderate bilateral L4 neural foraminal stenosis in large part due to facet spurring.   L5-S1: Mild retrolisthesis. Disc space loss. Circumferential disc osteophyte complex. Mild to moderate facet hypertrophy. No spinal or lateral recess stenosis. Moderate left and borderline to mild right L5 neural foraminal stenosis.   IMPRESSION: 1. Multilevel lumbar spine degeneration with mild degenerative endplate marrow edema at L1-L2. 2. Mild degenerative spinal  stenosis at L1-L2 and L2-L3. Up to Moderate multifactorial spinal stenosis at L3-L4, with lateral recess involvement greater on the left. And up to Moderate degenerative neural foraminal stenosis at the bilateral L4 and left L5 nerve levels.     Electronically Signed   By: VEAR Hurst M.D.   On: 07/31/2023 08:18     Objective:  VS:  HT:    WT:   BMI:     BP:(!) 146/91  HR:71bpm  TEMP: ( )  RESP:  Physical Exam Vitals and nursing note reviewed.  Constitutional:      General: He is not in acute distress.    Appearance: Normal appearance. He is obese. He is not ill-appearing.  HENT:     Head: Normocephalic and atraumatic.     Right Ear: External ear normal.     Left Ear: External ear normal.     Nose: No congestion.   Eyes:     Extraocular Movements: Extraocular movements intact.    Cardiovascular:     Rate and Rhythm: Normal rate.     Pulses: Normal pulses.  Pulmonary:     Effort: Pulmonary effort is normal. No respiratory distress.  Abdominal:     General: There is no distension.     Palpations: Abdomen is soft.   Musculoskeletal:        General: No tenderness or signs of injury.     Cervical back: Neck supple.     Right lower leg: No edema.     Left lower leg: No edema.     Comments: Patient has good distal strength without clonus.   Skin:  Findings: No erythema or rash.   Neurological:     General: No focal deficit present.     Mental Status: He is alert and oriented to person, place, and time.     Sensory: No sensory deficit.     Motor: No weakness or abnormal muscle tone.     Coordination: Coordination normal.   Psychiatric:        Mood and Affect: Mood normal.        Behavior: Behavior normal.      Imaging: XR C-ARM NO REPORT Result Date: 08/29/2023 Please see Notes tab for imaging impression.

## 2023-08-29 NOTE — Procedures (Signed)
 Lumbar Epidural Steroid Injection - Interlaminar Approach with Fluoroscopic Guidance  Patient: Patrick [name]      Date of Birth: 10-01-1960 MRN: [id number] PCP: Norleen Lynwood ORN, MD      Visit Date: 08/29/2023   Universal Protocol:     Consent Given By: the patient  Position: PRONE  Additional Comments: Vital signs were monitored before and after the procedure. Patient was prepped and draped in the usual sterile fashion. The correct patient, procedure, and site was verified.   Injection Procedure Details:   Procedure diagnoses: Lumbar radiculopathy [M54.16]   Meds Administered:  Meds ordered this encounter  Medications   methylPREDNISolone  acetate (DEPO-MEDROL ) injection 40 mg     Laterality: Right  Location/Site:  L4-5  Needle: 3.5 in., 20 ga. Tuohy  Needle Placement: Paramedian epidural  Findings:   -Comments: Excellent flow of contrast into the epidural space.  Procedure Details: Using a paramedian approach from the side mentioned above, the region overlying the inferior lamina was localized under fluoroscopic visualization and the soft tissues overlying this structure were infiltrated with 4 ml. of 1% Lidocaine  without Epinephrine . The Tuohy needle was inserted into the epidural space using a paramedian approach.   The epidural space was localized using loss of resistance along with counter oblique bi-planar fluoroscopic views.  After negative aspirate for air, blood, and CSF, a 2 ml. volume of Isovue-250 was injected into the epidural space and the flow of contrast was observed. Radiographs were obtained for documentation purposes.    The injectate was administered into the level noted above.   Additional Comments:  The patient tolerated the procedure well Dressing: 2 x 2 sterile gauze and Band-Aid    Post-procedure details: Patient was observed during the procedure. Post-procedure instructions were reviewed.  Patient left the clinic in stable condition.

## 2023-08-29 NOTE — Patient Instructions (Signed)

## 2023-08-29 NOTE — Progress Notes (Signed)
 Pain Scale   Average Pain 2 Patient advising he has chronic lower back pain that radiates to his left leg at times. Patient advising he started experiencing pain after bilateral knee replacements.         +Driver, -BT, -Dye Allergies.

## 2023-09-02 ENCOUNTER — Encounter: Payer: Self-pay | Admitting: Family Medicine

## 2023-09-02 ENCOUNTER — Other Ambulatory Visit (INDEPENDENT_AMBULATORY_CARE_PROVIDER_SITE_OTHER): Payer: 59

## 2023-09-02 ENCOUNTER — Ambulatory Visit: Payer: Self-pay

## 2023-09-02 ENCOUNTER — Ambulatory Visit (INDEPENDENT_AMBULATORY_CARE_PROVIDER_SITE_OTHER): Admitting: Family Medicine

## 2023-09-02 VITALS — BP 110/76 | HR 75 | Ht 70.0 in | Wt 231.0 lb

## 2023-09-02 DIAGNOSIS — M25521 Pain in right elbow: Secondary | ICD-10-CM

## 2023-09-02 DIAGNOSIS — R739 Hyperglycemia, unspecified: Secondary | ICD-10-CM

## 2023-09-02 DIAGNOSIS — E538 Deficiency of other specified B group vitamins: Secondary | ICD-10-CM

## 2023-09-02 DIAGNOSIS — I10 Essential (primary) hypertension: Secondary | ICD-10-CM | POA: Diagnosis not present

## 2023-09-02 DIAGNOSIS — M542 Cervicalgia: Secondary | ICD-10-CM | POA: Diagnosis not present

## 2023-09-02 DIAGNOSIS — Z125 Encounter for screening for malignant neoplasm of prostate: Secondary | ICD-10-CM | POA: Diagnosis not present

## 2023-09-02 DIAGNOSIS — G5622 Lesion of ulnar nerve, left upper limb: Secondary | ICD-10-CM

## 2023-09-02 DIAGNOSIS — E559 Vitamin D deficiency, unspecified: Secondary | ICD-10-CM

## 2023-09-02 DIAGNOSIS — M25522 Pain in left elbow: Secondary | ICD-10-CM | POA: Diagnosis not present

## 2023-09-02 LAB — URINALYSIS, ROUTINE W REFLEX MICROSCOPIC
Bilirubin Urine: NEGATIVE
Hgb urine dipstick: NEGATIVE
Ketones, ur: NEGATIVE
Leukocytes,Ua: NEGATIVE
Nitrite: NEGATIVE
RBC / HPF: NONE SEEN (ref 0–?)
Specific Gravity, Urine: 1.015 (ref 1.000–1.030)
Total Protein, Urine: NEGATIVE
Urine Glucose: NEGATIVE
Urobilinogen, UA: 0.2 (ref 0.0–1.0)
WBC, UA: NONE SEEN (ref 0–?)
pH: 6.5 (ref 5.0–8.0)

## 2023-09-02 LAB — VITAMIN D 25 HYDROXY (VIT D DEFICIENCY, FRACTURES): VITD: 66.63 ng/mL (ref 30.00–100.00)

## 2023-09-02 LAB — HEMOGLOBIN A1C: Hgb A1c MFr Bld: 5.6 % (ref 4.6–6.5)

## 2023-09-02 LAB — BASIC METABOLIC PANEL WITH GFR
BUN: 14 mg/dL (ref 6–23)
CO2: 30 meq/L (ref 19–32)
Calcium: 9.5 mg/dL (ref 8.4–10.5)
Chloride: 100 meq/L (ref 96–112)
Creatinine, Ser: 0.78 mg/dL (ref 0.40–1.50)
GFR: 95.6 mL/min (ref >60.00)
Glucose, Bld: 89 mg/dL (ref 70–99)
Potassium: 4.4 meq/L (ref 3.5–5.1)
Sodium: 138 meq/L (ref 135–145)

## 2023-09-02 LAB — LIPID PANEL
Cholesterol: 119 mg/dL (ref 0–200)
HDL: 53.5 mg/dL (ref >39.00)
LDL Cholesterol: 42 mg/dL (ref 0–99)
NonHDL: 65.01
Total CHOL/HDL Ratio: 2
Triglycerides: 113 mg/dL (ref 0.0–149.0)
VLDL: 22.6 mg/dL (ref 0.0–40.0)

## 2023-09-02 LAB — TSH: TSH: 2.49 u[IU]/mL (ref 0.35–5.50)

## 2023-09-02 LAB — CBC WITH DIFFERENTIAL/PLATELET
Basophils Absolute: 0.1 10*3/uL (ref 0.0–0.1)
Basophils Relative: 0.8 % (ref 0.0–3.0)
Eosinophils Absolute: 0.2 10*3/uL (ref 0.0–0.7)
Eosinophils Relative: 2.7 % (ref 0.0–5.0)
HCT: 44 % (ref 39.0–52.0)
Hemoglobin: 15 g/dL (ref 13.0–17.0)
Lymphocytes Relative: 25.3 % (ref 12.0–46.0)
Lymphs Abs: 1.7 10*3/uL (ref 0.7–4.0)
MCHC: 34.2 g/dL (ref 30.0–36.0)
MCV: 88 fl (ref 78.0–100.0)
Monocytes Absolute: 0.8 10*3/uL (ref 0.1–1.0)
Monocytes Relative: 11.9 % (ref 3.0–12.0)
Neutro Abs: 4 10*3/uL (ref 1.4–7.7)
Neutrophils Relative %: 59.3 % (ref 43.0–77.0)
Platelets: 241 10*3/uL (ref 150.0–400.0)
RBC: 5.01 Mil/uL (ref 4.22–5.81)
RDW: 12.6 % (ref 11.5–15.5)
WBC: 6.7 10*3/uL (ref 4.0–10.5)

## 2023-09-02 LAB — HEPATIC FUNCTION PANEL
ALT: 24 U/L (ref 0–53)
AST: 20 U/L (ref 0–37)
Albumin: 4.5 g/dL (ref 3.5–5.2)
Alkaline Phosphatase: 60 U/L (ref 39–117)
Bilirubin, Direct: 0.2 mg/dL (ref 0.0–0.3)
Total Bilirubin: 0.9 mg/dL (ref 0.2–1.2)
Total Protein: 6.8 g/dL (ref 6.0–8.3)

## 2023-09-02 LAB — VITAMIN B12: Vitamin B-12: 1100 pg/mL — ABNORMAL HIGH (ref 211–911)

## 2023-09-02 LAB — PSA: PSA: 0.42 ng/mL (ref 0.10–4.00)

## 2023-09-02 NOTE — Assessment & Plan Note (Signed)
 Bilateral ulnar neuropathy.  Given injection and only the left side previously and did respond for some months but now coming back.  I feel that at this point a nerve conduction study and EMG would be helpful.  This would help us  evaluate the severity and location of the neuropathy that patient is having.  Follow-up again in 6 to 8 weeks.

## 2023-09-02 NOTE — Patient Instructions (Addendum)
 Neurology referral Nerve Conduction Study See you again in 2 months

## 2023-09-04 ENCOUNTER — Ambulatory Visit (INDEPENDENT_AMBULATORY_CARE_PROVIDER_SITE_OTHER): Payer: 59 | Admitting: Internal Medicine

## 2023-09-04 ENCOUNTER — Encounter: Payer: Self-pay | Admitting: Internal Medicine

## 2023-09-04 VITALS — BP 136/82 | HR 70 | Temp 98.1°F | Ht 70.0 in | Wt 231.0 lb

## 2023-09-04 DIAGNOSIS — Z0000 Encounter for general adult medical examination without abnormal findings: Secondary | ICD-10-CM

## 2023-09-04 DIAGNOSIS — I10 Essential (primary) hypertension: Secondary | ICD-10-CM | POA: Diagnosis not present

## 2023-09-04 DIAGNOSIS — R739 Hyperglycemia, unspecified: Secondary | ICD-10-CM

## 2023-09-04 DIAGNOSIS — E538 Deficiency of other specified B group vitamins: Secondary | ICD-10-CM | POA: Diagnosis not present

## 2023-09-04 DIAGNOSIS — E559 Vitamin D deficiency, unspecified: Secondary | ICD-10-CM | POA: Diagnosis not present

## 2023-09-04 DIAGNOSIS — Z0001 Encounter for general adult medical examination with abnormal findings: Secondary | ICD-10-CM | POA: Insufficient documentation

## 2023-09-04 DIAGNOSIS — I2510 Atherosclerotic heart disease of native coronary artery without angina pectoris: Secondary | ICD-10-CM

## 2023-09-04 DIAGNOSIS — E7800 Pure hypercholesterolemia, unspecified: Secondary | ICD-10-CM | POA: Diagnosis not present

## 2023-09-04 DIAGNOSIS — I2583 Coronary atherosclerosis due to lipid rich plaque: Secondary | ICD-10-CM | POA: Diagnosis not present

## 2023-09-04 DIAGNOSIS — Z125 Encounter for screening for malignant neoplasm of prostate: Secondary | ICD-10-CM

## 2023-09-04 MED ORDER — FLUTICASONE PROPIONATE 50 MCG/ACT NA SUSP: 2.0000 | Freq: Every day | NASAL | 11 refills | Status: AC

## 2023-09-04 MED ORDER — ROSUVASTATIN CALCIUM 20 MG PO TABS: 20.0000 mg | ORAL_TABLET | Freq: Every day | ORAL | 3 refills | Status: AC

## 2023-09-04 MED ORDER — TELMISARTAN 80 MG PO TABS: 80.0000 mg | ORAL_TABLET | Freq: Every day | ORAL | 3 refills | Status: AC

## 2023-09-04 NOTE — Assessment & Plan Note (Signed)
 Lab Results  Component Value Date   HGBA1C 5.6 09/02/2023   Stable, pt to continue current medical treatment  - diet, wt control

## 2023-09-04 NOTE — Assessment & Plan Note (Signed)
 For repeat Card CT score, has cardiology f/u sept 2025

## 2023-09-04 NOTE — Assessment & Plan Note (Signed)
 Last vitamin D  Lab Results  Component Value Date   VD25OH 66.63 09/02/2023   Stable, cont oral replacement

## 2023-09-04 NOTE — Assessment & Plan Note (Signed)

## 2023-09-04 NOTE — Assessment & Plan Note (Signed)
 Lab Results  Component Value Date   VITAMINB12 1,100 (H) 09/02/2023   Now overcontrolled, ok to hold further oral replacement - b12 1000 mcg every day for now

## 2023-09-04 NOTE — Progress Notes (Signed)
 Patient ID: Patrick [name], male   DOB: 1960-12-14, 63 y.o.   MRN: [id number]         Chief Complaint:: wellness exam and CAD, hld, htn, allergies       HPI:  Patrick [name] is a 63 y.o. male here for wellness exam; declines prevnar as may have been done at pharmacy recently, o/w up to date                        Also s/p right knee TKR feb 2025 now doing well.  Pt continues to have recurring LBP without change in severity, bowel or bladder change, fever, wt loss,  worsening LE pain/numbness/weakness, gait change or falls, but had ESI after recent MRI c/w spinal stenosis L1-5.  Has plans to f/u with Dr Jeffrie cardiology sept 2025.  Does have several wks ongoing nasal allergy  symptoms with clearish congestion, itch and sneezing, without fever, pain, ST, cough, swelling or wheezing.Good med tolerance and compliance.  Going to the GYM at least twice per wk   Wt Readings from Last 3 Encounters:  09/04/23 231 lb (104.8 kg)  09/02/23 231 lb (104.8 kg)  05/21/23 228 lb (103.4 kg)   BP Readings from Last 3 Encounters:  09/04/23 136/82  09/02/23 110/76  08/29/23 (!) 146/91   Immunization History  Administered Date(s) Administered   Moderna Sars-Covid-2 Vaccination 05/20/2019, 06/17/2019, 01/25/2020   Pneumococcal Conjugate-13 08/18/2020   Tdap 02/21/2015   Zoster Recombinant(Shingrix) 08/29/2020, 11/03/2020   There are no preventive care reminders to display for this patient.     Past Medical History:  Diagnosis Date   Allergy     Arthritis    left knee   Dysrhythmia     [name]   Heart murmur    History of hiatal hernia    4 years ago per patient- no surgery needed   History of kidney stones    Hx of adenomatous polyp of colon    Hyperlipidemia    Hypertension    Past Surgical History:  Procedure Laterality Date   APPENDECTOMY     Arthroscopic knee surgeries on both knees     CHOLECYSTECTOMY     COLONOSCOPY     NASAL SEPTOPLASTY W/  TURBINOPLASTY Bilateral 04/13/2019   Procedure: NASAL SEPTOPLASTY WITH BILATERAL TURBINATE REDUCTION;  Surgeon: Karis Clunes, MD;  Location: [facility];  Service: ENT;  Laterality: Bilateral;   TOTAL KNEE ARTHROPLASTY Left 01/21/2020   Procedure: LEFT TOTAL KNEE ARTHROPLASTY;  Surgeon: Addie Cordella Hamilton, MD;  Location: [facility] OR;  Service: Orthopedics;  Laterality: Left;   TOTAL KNEE ARTHROPLASTY Right 04/08/2023   Procedure: RIGHT TOTAL KNEE ARTHROPLASTY;  Surgeon: Addie Cordella Hamilton, MD;  Location: [facility] OR;  Service: Orthopedics;  Laterality: Right;   VASECTOMY      reports that he has quit smoking. His smoking use included cigars and cigarettes. He has never used smokeless tobacco. He reports current alcohol use of about 5.0 standard drinks of alcohol per week. He reports that he does not use drugs. family history is not on file. He was adopted. Allergies  Allergen Reactions   Penicillins Hives    Rash around neck; 04/08/23 tolerated cefazolin     Current Outpatient Medications on File Prior to Visit  Medication Sig Dispense Refill   Ascorbic Acid (VITAMIN C) 1000 MG tablet Take 1,000 mg by mouth daily.     cetirizine (ZYRTEC) 10 MG tablet Take  10 mg by mouth at bedtime.      cholecalciferol (VITAMIN D3) 25 MCG (1000 UNIT) tablet Take 1,000 Units by mouth daily.      gabapentin  (NEURONTIN ) 100 MG capsule Take 2 capsules (200 mg total) by mouth 3 (three) times daily as needed. 360 capsule 1   Ginger, Zingiber officinalis, (GINGER ROOT) 550 MG CAPS Take 550 mg by mouth daily.      Misc Natural Products (TART CHERRY ADVANCED) CAPS Take 1 capsule by mouth daily.      Multiple Vitamin (MULTIVITAMIN WITH MINERALS) TABS tablet Take 1 tablet by mouth daily. Men's Over 50      Turmeric Curcumin 500 MG CAPS Take 500 mg by mouth daily.      No current facility-administered medications on file prior to visit.        ROS:  All others reviewed and negative.  Objective        PE:  BP 136/82  (BP Location: Right Arm, Patient Position: Sitting, Cuff Size: Normal)   Pulse 70   Temp 98.1 F (36.7 C) (Oral)   Ht 5' 10 (1.778 m)   Wt 231 lb (104.8 kg)   SpO2 98%   BMI 33.15 kg/m                 Constitutional: Pt appears in NAD               HENT: Head: NCAT.                Right Ear: External ear normal.                 Left Ear: External ear normal.                Eyes: . Pupils are equal, round, and reactive to light. Conjunctivae and EOM are normal               Nose: without d/c or deformity               Neck: Neck supple. Gross normal ROM               Cardiovascular: Normal rate and regular rhythm.                 Pulmonary/Chest: Effort normal and breath sounds without rales or wheezing.                Abd:  Soft, NT, ND, + BS, no organomegaly               Neurological: Pt is alert. At baseline orientation, motor grossly intact               Skin: Skin is warm. No rashes, no other new lesions, LE edema - none               Psychiatric: Pt behavior is normal without agitation   Micro: none  Cardiac tracings I have personally interpreted today:  none  Pertinent Radiological findings (summarize): none   Lab Results  Component Value Date   WBC 6.7 09/02/2023   HGB 15.0 09/02/2023   HCT 44.0 09/02/2023   PLT 241.0 09/02/2023   GLUCOSE 89 09/02/2023   CHOL 119 09/02/2023   TRIG 113.0 09/02/2023   HDL 53.50 09/02/2023   LDLCALC 42 09/02/2023   ALT 24 09/02/2023   AST 20 09/02/2023   NA 138 09/02/2023   K 4.4 09/02/2023   CL 100 09/02/2023  CREATININE 0.78 09/02/2023   BUN 14 09/02/2023   CO2 30 09/02/2023   TSH 2.49 09/02/2023   PSA 0.42 09/02/2023   HGBA1C 5.6 09/02/2023   Assessment/Plan:  Patrick [name] is a 63 y.o. White or Caucasian [1] male with  has a past medical history of Allergy , Arthritis, Dysrhythmia, Heart murmur, History of hiatal hernia, History of kidney stones, adenomatous polyp of colon, Hyperlipidemia, and Hypertension.  Encounter  for well adult exam with abnormal findings Age and sex appropriate education and counseling updated with regular exercise and diet Referrals for preventative services - none needed Immunizations addressed - none needed Smoking counseling  - none needed Evidence for depression or other mood disorder - none significant Most recent labs reviewed. I have personally reviewed and have noted: 1) the patient's medical and social history 2) The patient's current medications and supplements 3) The patient's height, weight, and BMI have been recorded in the chart   HTN (hypertension) BP Readings from Last 3 Encounters:  09/04/23 136/82  09/02/23 110/76  08/29/23 (!) 146/91   Small uncontrolled, pt to continue medical treatment micardis  80 mg every day, declines change today   Hyperglycemia Lab Results  Component Value Date   HGBA1C 5.6 09/02/2023   Stable, pt to continue current medical treatment  - diet, wt control   HLD (hyperlipidemia) Lab Results  Component Value Date   LDLCALC 42 09/02/2023   Stable, pt to continue current statin crestor  20 mg qd   Vitamin D  deficiency Last vitamin D  Lab Results  Component Value Date   VD25OH 66.63 09/02/2023   Stable, cont oral replacement   B12 deficiency Lab Results  Component Value Date   VITAMINB12 1,100 (H) 09/02/2023   Now overcontrolled, ok to hold further oral replacement - b12 1000 mcg every day for now   Coronary artery calcification For repeat Card CT score, has cardiology f/u sept 2025  Followup: Return in about 1 year (around 09/03/2024).  Lynwood Rush, MD 09/04/2023 10:35 AM [facility]

## 2023-09-04 NOTE — Patient Instructions (Signed)
 Please continue all other medications as before, and refills have been done if requested.  Please have the pharmacy call with any other refills you may need.  Please continue your efforts at being more active, low cholesterol diet, and weight control.  You are otherwise up to date with prevention measures today.  Please keep your appointments with your specialists as you may have planned  Your lab work was excellent, and ok to hold on the B12 supplement for now.   You will be contacted regarding the referral for: Cardiac CT score (at [facility])  Please make an Appointment to return for your 1 year visit, or sooner if needed, with Lab testing by Appointment as well, to be done about 3-5 days before at the FIRST FLOOR Lab (so this is for TWO appointments - please see the scheduling desk as you leave)

## 2023-09-04 NOTE — Assessment & Plan Note (Signed)
 Lab Results  Component Value Date   LDLCALC 42 09/02/2023   Stable, pt to continue current statin crestor  20 mg qd

## 2023-09-04 NOTE — Assessment & Plan Note (Signed)
 BP Readings from Last 3 Encounters:  09/04/23 136/82  09/02/23 110/76  08/29/23 (!) 146/91   Small uncontrolled, pt to continue medical treatment micardis  80 mg every day, declines change today

## 2023-09-10 ENCOUNTER — Other Ambulatory Visit: Payer: Self-pay

## 2023-09-10 ENCOUNTER — Encounter: Payer: Self-pay | Admitting: Neurology

## 2023-09-10 DIAGNOSIS — R202 Paresthesia of skin: Secondary | ICD-10-CM

## 2023-09-15 ENCOUNTER — Other Ambulatory Visit: Payer: Self-pay | Admitting: Family Medicine

## 2023-09-18 ENCOUNTER — Telehealth: Payer: Self-pay | Admitting: Orthopedic Surgery

## 2023-09-18 NOTE — Telephone Encounter (Signed)
 Natalie from [employer] called and needs a copy faxed of the protocol for dental. CB#[phone]  fax# [phone]

## 2023-09-26 ENCOUNTER — Ambulatory Visit (HOSPITAL_BASED_OUTPATIENT_CLINIC_OR_DEPARTMENT_OTHER)
Admission: RE | Admit: 2023-09-26 | Discharge: 2023-09-26 | Disposition: A | Discharge status: Home / Self Care | Payer: Self-pay | Source: Ambulatory Visit | Attending: Internal Medicine | Admitting: Internal Medicine

## 2023-09-26 DIAGNOSIS — R739 Hyperglycemia, unspecified: Secondary | ICD-10-CM | POA: Insufficient documentation

## 2023-09-26 DIAGNOSIS — I2510 Atherosclerotic heart disease of native coronary artery without angina pectoris: Secondary | ICD-10-CM | POA: Insufficient documentation

## 2023-09-26 DIAGNOSIS — E7800 Pure hypercholesterolemia, unspecified: Secondary | ICD-10-CM | POA: Insufficient documentation

## 2023-09-26 DIAGNOSIS — I10 Essential (primary) hypertension: Secondary | ICD-10-CM | POA: Insufficient documentation

## 2023-09-26 DIAGNOSIS — I2583 Coronary atherosclerosis due to lipid rich plaque: Secondary | ICD-10-CM | POA: Insufficient documentation

## 2023-09-27 ENCOUNTER — Other Ambulatory Visit: Payer: Self-pay | Admitting: Internal Medicine

## 2023-09-27 ENCOUNTER — Ambulatory Visit: Payer: Self-pay | Admitting: Internal Medicine

## 2023-09-27 ENCOUNTER — Encounter: Payer: Self-pay | Admitting: Internal Medicine

## 2023-09-27 DIAGNOSIS — R931 Abnormal findings on diagnostic imaging of heart and coronary circulation: Secondary | ICD-10-CM

## 2023-09-27 MED ORDER — ASPIRIN 81 MG PO TBEC
81.0000 mg | DELAYED_RELEASE_TABLET | Freq: Every day | ORAL | Status: AC
Start: 2023-09-27 — End: ?

## 2023-09-30 ENCOUNTER — Encounter: Payer: Self-pay | Admitting: Cardiology

## 2023-09-30 ENCOUNTER — Other Ambulatory Visit: Payer: Self-pay | Admitting: Internal Medicine

## 2023-09-30 DIAGNOSIS — E785 Hyperlipidemia, unspecified: Secondary | ICD-10-CM

## 2023-09-30 DIAGNOSIS — E7800 Pure hypercholesterolemia, unspecified: Secondary | ICD-10-CM

## 2023-09-30 DIAGNOSIS — I2510 Atherosclerotic heart disease of native coronary artery without angina pectoris: Secondary | ICD-10-CM

## 2023-09-30 DIAGNOSIS — Z79899 Other long term (current) drug therapy: Secondary | ICD-10-CM

## 2023-10-08 ENCOUNTER — Other Ambulatory Visit (INDEPENDENT_AMBULATORY_CARE_PROVIDER_SITE_OTHER)

## 2023-10-08 DIAGNOSIS — I2510 Atherosclerotic heart disease of native coronary artery without angina pectoris: Secondary | ICD-10-CM

## 2023-10-08 DIAGNOSIS — R739 Hyperglycemia, unspecified: Secondary | ICD-10-CM

## 2023-10-08 DIAGNOSIS — E538 Deficiency of other specified B group vitamins: Secondary | ICD-10-CM

## 2023-10-08 DIAGNOSIS — Z125 Encounter for screening for malignant neoplasm of prostate: Secondary | ICD-10-CM | POA: Diagnosis not present

## 2023-10-08 DIAGNOSIS — E7800 Pure hypercholesterolemia, unspecified: Secondary | ICD-10-CM | POA: Diagnosis not present

## 2023-10-08 DIAGNOSIS — E559 Vitamin D deficiency, unspecified: Secondary | ICD-10-CM

## 2023-10-08 LAB — URINALYSIS, ROUTINE W REFLEX MICROSCOPIC
Bilirubin Urine: NEGATIVE
Hgb urine dipstick: NEGATIVE
Ketones, ur: NEGATIVE
Leukocytes,Ua: NEGATIVE
Nitrite: NEGATIVE
RBC / HPF: NONE SEEN (ref 0–?)
Specific Gravity, Urine: 1.015 (ref 1.000–1.030)
Total Protein, Urine: NEGATIVE
Urine Glucose: NEGATIVE
Urobilinogen, UA: 0.2 (ref 0.0–1.0)
WBC, UA: NONE SEEN (ref 0–?)
pH: 6 (ref 5.0–8.0)

## 2023-10-08 LAB — CBC WITH DIFFERENTIAL/PLATELET
Basophils Absolute: 0 K/uL (ref 0.0–0.1)
Basophils Relative: 0.8 % (ref 0.0–3.0)
Eosinophils Absolute: 0.2 K/uL (ref 0.0–0.7)
Eosinophils Relative: 3.9 % (ref 0.0–5.0)
HCT: 43.1 % (ref 39.0–52.0)
Hemoglobin: 14.7 g/dL (ref 13.0–17.0)
Lymphocytes Relative: 24.8 % (ref 12.0–46.0)
Lymphs Abs: 1.3 K/uL (ref 0.7–4.0)
MCHC: 34 g/dL (ref 30.0–36.0)
MCV: 89.1 fl (ref 78.0–100.0)
Monocytes Absolute: 0.6 K/uL (ref 0.1–1.0)
Monocytes Relative: 11.1 % (ref 3.0–12.0)
Neutro Abs: 3.1 K/uL (ref 1.4–7.7)
Neutrophils Relative %: 59.4 % (ref 43.0–77.0)
Platelets: 212 K/uL (ref 150.0–400.0)
RBC: 4.84 Mil/uL (ref 4.22–5.81)
RDW: 12.8 % (ref 11.5–15.5)
WBC: 5.3 K/uL (ref 4.0–10.5)

## 2023-10-08 LAB — HEPATIC FUNCTION PANEL
ALT: 30 U/L (ref 0–53)
AST: 23 U/L (ref 0–37)
Albumin: 4.2 g/dL (ref 3.5–5.2)
Alkaline Phosphatase: 54 U/L (ref 39–117)
Bilirubin, Direct: 0.1 mg/dL (ref 0.0–0.3)
Total Bilirubin: 0.6 mg/dL (ref 0.2–1.2)
Total Protein: 6.5 g/dL (ref 6.0–8.3)

## 2023-10-08 LAB — BASIC METABOLIC PANEL WITH GFR
BUN: 23 mg/dL (ref 6–23)
CO2: 28 meq/L (ref 19–32)
Calcium: 9.2 mg/dL (ref 8.4–10.5)
Chloride: 106 meq/L (ref 96–112)
Creatinine, Ser: 0.93 mg/dL (ref 0.40–1.50)
GFR: 87.97 mL/min (ref >60.00)
Glucose, Bld: 98 mg/dL (ref 70–99)
Potassium: 4.8 meq/L (ref 3.5–5.1)
Sodium: 142 meq/L (ref 135–145)

## 2023-10-08 LAB — VITAMIN B12: Vitamin B-12: 497 pg/mL (ref 211–911)

## 2023-10-08 LAB — LIPID PANEL
Cholesterol: 116 mg/dL (ref 0–200)
HDL: 45.2 mg/dL (ref >39.00)
LDL Cholesterol: 38 mg/dL (ref 0–99)
NonHDL: 70.4
Total CHOL/HDL Ratio: 3
Triglycerides: 160 mg/dL — ABNORMAL HIGH (ref 0.0–149.0)
VLDL: 32 mg/dL (ref 0.0–40.0)

## 2023-10-08 LAB — TSH: TSH: 2.53 u[IU]/mL (ref 0.35–5.50)

## 2023-10-08 LAB — VITAMIN D 25 HYDROXY (VIT D DEFICIENCY, FRACTURES): VITD: 66.63 ng/mL (ref 30.00–100.00)

## 2023-10-08 LAB — HEMOGLOBIN A1C: Hgb A1c MFr Bld: 5.7 % (ref 4.6–6.5)

## 2023-10-08 LAB — PSA: PSA: 0.48 ng/mL (ref 0.10–4.00)

## 2023-10-08 NOTE — Progress Notes (Signed)
 The test results show that your current treatment is OK, as the tests are stable.  Please continue the same plan.  There is no other need for change of treatment or further evaluation based on these results, at this time.  thanks

## 2023-10-09 ENCOUNTER — Other Ambulatory Visit: Payer: Self-pay | Admitting: *Deleted

## 2023-10-09 DIAGNOSIS — E785 Hyperlipidemia, unspecified: Secondary | ICD-10-CM

## 2023-10-09 DIAGNOSIS — Z79899 Other long term (current) drug therapy: Secondary | ICD-10-CM

## 2023-10-10 NOTE — Progress Notes (Signed)
 Cardiology Office Note   Date:  10/23/2023  ID:  Patrick [name], DOB 10-30-1960, MRN [id number] PCP: Patrick [name] ORN, [name]  Patrick [name] Providers Cardiologist:  Patrick Parchment, [name]    History of Present Illness Patrick [name] is a 63 y.o. male with a past medical history of   Elevated coronary calcium  score  Previously had coronary calcium  scoring in 09/2020 that showed coronary calcium  score of 158 (77th percentile)  More recently, coronary calcium  score in 09/2023 showed Coronary calcium  score of 542 (89th percentile) On ASA, crestor  HTN  HLD   Last seen by Dr. Parchment in 12/2020 for evaluation of elevated coronary calcium  score. Felt well and denied significant symptoms at that time. HE was active, worked out regularly at [employer] Maternal side may have history of CAD.   Patient presents today to discuss coronary calcium  score.  He had undergone CT calcium  scoring in 09/2023 and his coronary calcium  score was elevated.  He was previously in the 77th percentile in 2022, now up to the 89th percentile in 2025.  He is concerned about this jump because he has been doing good job controlling his diet and cholesterol.  He is also continues to exercise.  He exercises in the gym, uses the elliptical, swims, occasionally lifts weights.  He denies any chest pain or dyspnea on exertion.  He denies syncope, near syncope.  He occasionally will have a palpitation/flutter in his chest but these are mild.  He was recently traveling and did not follow a low-cholesterol diet while traveling.  He also drank more alcohol than he usually does.  Patient is adopted and is unsure of his medical history on his paternal side.  May have a family history of heart problems on his maternal side   Studies Reviewed  Cardiac Studies & Procedures   ______________________________________________________________________________________________          CT SCANS  CT CARDIAC SCORING (SELF PAY ONLY) 09/26/2023  Addendum  09/27/2023 12:01 PM ADDENDUM REPORT: 09/27/2023 11:59  ADDENDUM: Limited non-coronary overread as follows:  Cardiovascular: No significant extracardiac vascular findings. Normal heart size. No pericardial effusion.  Limited Mediastinum/Nodes: No enlarged mediastinal, hilar, or axillary lymph nodes. Trachea and esophagus demonstrate no significant findings.  Limited Lungs/Pleura: Lungs are clear. No pleural effusion or pneumothorax.  Upper Abdomen: No acute abnormality.  Musculoskeletal: No chest wall abnormality. No acute osseous findings.  IMPRESSION: No acute extracardiac findings in the included chest.   Electronically Signed By: Patrick [name] M.D. On: 09/27/2023 11:59  Narrative CLINICAL DATA:  59M for cardiovascular disease risk stratification  EXAM: Coronary Calcium  Score  TECHNIQUE: A gated, non-contrast computed tomography scan of the heart was performed using 3mm slice thickness. Axial images were analyzed on a dedicated workstation. Calcium  scoring of the coronary arteries was performed using the Agatston method.  FINDINGS: Coronary Calcium  Score:  Left main: 0  Left anterior descending artery: 249  Left circumflex artery: 14.2  Right coronary artery: 279  Total: 542  Percentile: 89th  Pericardium: Normal.  Non-cardiac: See separate report from [name].  IMPRESSION: 1. Coronary calcium  score of 542. This was 89th percentile for age-, race-, and sex-matched controls.  RECOMMENDATIONS: Coronary artery calcium  (CAC) score is a strong predictor of incident coronary heart disease (CHD) and provides predictive information beyond traditional risk factors. CAC scoring is reasonable to use in the decision to withhold, postpone, or initiate statin therapy in intermediate-risk or selected borderline-risk asymptomatic adults (age 54-75 years and LDL-C >=70 to <190  mg/dL) who do not have diabetes or established  atherosclerotic cardiovascular disease (ASCVD).* In intermediate-risk (10-year ASCVD risk >=7.5% to <20%) adults or selected borderline-risk (10-year ASCVD risk >=5% to <7.5%) adults in whom a CAC score is measured for the purpose of making a treatment decision the following recommendations have been made:  If CAC=0, it is reasonable to withhold statin therapy and reassess in 5 to 10 years, as long as higher risk conditions are absent (diabetes mellitus, family history of premature CHD in first degree relatives (males <55 years; females <65 years), cigarette smoking, or LDL >=190 mg/dL).  If CAC is 1 to 99, it is reasonable to initiate statin therapy for patients >=10 years of age.  If CAC is >=100 or >=75th percentile, it is reasonable to initiate statin therapy at any age.  Cardiology referral should be considered for patients with CAC scores >=400 or >=75th percentile.  *2018 AHA/ACC/AACVPR/AAPA/ABC/ACPM/ADA/AGS/APhA/ASPC/NLA/PCNA Guideline on the Management of Blood Cholesterol: A Report of the American College of Cardiology/American Heart Association Task Force on Clinical Practice Guidelines. J Am Coll Cardiol. 2019;73(24):3168-3209.  Patrick [name]  Electronically Signed: By: Patrick [name] M.D. On: 09/27/2023 11:37   CT SCANS  CT CARDIAC SCORING (SELF PAY ONLY) 09/22/2020  Addendum 09/22/2020  5:39 PM ADDENDUM REPORT: 09/22/2020 17:37  CLINICAL DATA:  Cardiovascular Disease Risk stratification  EXAM: Coronary Calcium  Score  TECHNIQUE: A gated, non-contrast computed tomography scan of the heart was performed using 3mm slice thickness. Axial images were analyzed on a dedicated workstation. Calcium  scoring of the coronary arteries was performed using the Agatston method.  FINDINGS: Coronary arteries: Normal origins.  Coronary Calcium  Score:  Left main: 0  Left anterior descending artery: 96  Left circumflex artery: 0  Right coronary artery:  62  Total: 158  Percentile: 77  Pericardium: Normal.  Aorta: Normal caliber of ascending aorta. No aortic atherosclerosis noted.  Non-cardiac: See separate report from [facility].  IMPRESSION: Coronary calcium  score of 158. This was 77th percentile for age-, race-, and sex-matched controls.  RECOMMENDATIONS: Coronary artery calcium  (CAC) score is a strong predictor of incident coronary heart disease (CHD) and provides predictive information beyond traditional risk factors. CAC scoring is reasonable to use in the decision to withhold, postpone, or initiate statin therapy in intermediate-risk or selected borderline-risk asymptomatic adults (age 33-75 years and LDL-C >=70 to <190 mg/dL) who do not have diabetes or established atherosclerotic cardiovascular disease (ASCVD).* In intermediate-risk (10-year ASCVD risk >=7.5% to <20%) adults or selected borderline-risk (10-year ASCVD risk >=5% to <7.5%) adults in whom a CAC score is measured for the purpose of making a treatment decision the following recommendations have been made:  If CAC=0, it is reasonable to withhold statin therapy and reassess in 5 to 10 years, as long as higher risk conditions are absent (diabetes mellitus, family history of premature CHD in first degree relatives (males <55 years; females <65 years), cigarette smoking, or LDL >=190 mg/dL).  If CAC is 1 to 99, it is reasonable to initiate statin therapy for patients >=67 years of age.  If CAC is >=100 or >=75th percentile, it is reasonable to initiate statin therapy at any age.  Cardiology referral should be considered for patients with CAC scores >=400 or >=75th percentile.  *2018 AHA/ACC/AACVPR/AAPA/ABC/ACPM/ADA/AGS/APhA/ASPC/NLA/PCNA Guideline on the Management of Blood Cholesterol: A Report of the American College of Cardiology/American Heart Association Task Force on Clinical Practice Guidelines. J Am Coll  Cardiol. 2019;73(24):3168-3209.  Shelda Bruckner, [name]   Electronically Signed By: Shelda  Lonni M.D. On: 09/22/2020 17:37  Narrative EXAM: OVER-READ INTERPRETATION  CT CHEST  The following report is an over-read performed by radiologist Dr. Toribio Aye of [facility] on 09/22/2020. This over-read does not include interpretation of cardiac or coronary anatomy or pathology. The coronary calcium  score interpretation by the cardiologist is attached.  COMPARISON:  None.  FINDINGS: 2 mm right upper lobe pulmonary nodule (axial image 11 of series 3). Within the visualized portions of the thorax there are no other larger more suspicious appearing pulmonary nodules or masses, there is no acute consolidative airspace disease, no pleural effusions, no pneumothorax and no lymphadenopathy. Visualized portions of the upper abdomen are unremarkable. There are no aggressive appearing lytic or blastic lesions noted in the visualized portions of the skeleton.  IMPRESSION: 1. 2 mm right upper lobe pulmonary nodule, nonspecific, but statistically likely benign. No follow-up needed if patient is low-risk. Non-contrast chest CT can be considered in 12 months if patient is high-risk. This recommendation follows the consensus statement: Guidelines for Management of Incidental Pulmonary Nodules Detected on CT Images: From the Fleischner Society 2017; Radiology 2017; 284:228-243.  Electronically Signed: By: Toribio Aye M.D. On: 09/22/2020 08:29     ______________________________________________________________________________________________       Risk Assessment/Calculations           Physical Exam VS:  BP 130/84 (BP Location: Left Arm, Patient Position: Sitting, Cuff Size: Normal)   Pulse 69   Ht 5' 10 (1.778 m)   Wt 236 lb (107 kg)   BMI 33.86 kg/m        Wt Readings from Last 3 Encounters:  10/23/23 236 lb (107 kg)  09/04/23 231 lb  (104.8 kg)  09/02/23 231 lb (104.8 kg)    GEN: Well nourished, well developed in no acute distress.  Sitting comfortably on the chair NECK: No JVD  CARDIAC:  RRR, no murmurs, rubs, gallops.  Radial pulses 2+ bilaterally RESPIRATORY:  Clear to auscultation without rales, wheezing or rhonchi.  Normal work of breathing on room air ABDOMEN: Soft, non-tender, non-distended EXTREMITIES:  No edema in bilateral lower extremities; No deformity   ASSESSMENT AND PLAN   HLD  Elevated Coronary calcium  score  - Coronary calcium  score 542 (89th percentile) in 09/2023  - Patient concerned that his coronary calcium  score increased from the 77th percentile in 2022 to the 89th percentile in 2025. Discussed that statins can cause increase in coronary calcium  score. Patient is able to exercise regularly without chest pain or DOE. His BP and cholesterol are overall well controlled and he does not use tobacco products. Reassured patient that an elevated coronary calcium  score is only part of the picture, and he is doing a good job controlling other risk factors for CAD. EKG from 03/2023 was without ischemic changes   - Denies anginal symptoms- no indication for ischemic eval at this time  - Lipid panel form 10/08/23 showed LDL 57, HDL 50, triglycerides 195, total cholesterol 139 - Lipoprotein A 46 earlier this month  - Continue ASA 81 mg daily, crestor  40 mg daily   HTN  - BP well controlled, no symptoms of orthostatic hypotension  - continue telmisartan  80 mg daily  - K 4.8, creatinine 0.93 in 10/2023   Dispo: Follow up with Dr. Jeffrie in 4-6 months   Signed, Rollo FABIENE Louder, PA-C

## 2023-10-12 LAB — LIPOPROTEIN A (LPA): Lipoprotein (a): 43 nmol/L (ref <75)

## 2023-10-16 ENCOUNTER — Ambulatory Visit: Payer: Self-pay | Admitting: *Deleted

## 2023-10-16 LAB — NMR, LIPOPROFILE
Cholesterol, Total: 139 mg/dL (ref 100–199)
HDL Particle Number: 40.7 umol/L (ref >=30.5)
HDL-C: 50 mg/dL (ref >39)
LDL Particle Number: 698 nmol/L (ref <1000)
LDL Size: 19.8 nm — ABNORMAL LOW (ref >20.5)
LDL-C (NIH Calc): 57 mg/dL (ref 0–99)
LP-IR Score: 64 — ABNORMAL HIGH (ref <=45)
Small LDL Particle Number: 542 nmol/L — ABNORMAL HIGH (ref <=527)
Triglycerides: 195 mg/dL — ABNORMAL HIGH (ref 0–149)

## 2023-10-16 LAB — LIPOPROTEIN A (LPA): Lipoprotein (a): 46 nmol/L (ref <75.0)

## 2023-10-18 ENCOUNTER — Ambulatory Visit (INDEPENDENT_AMBULATORY_CARE_PROVIDER_SITE_OTHER): Admitting: Neurology

## 2023-10-18 ENCOUNTER — Ambulatory Visit: Payer: Self-pay | Admitting: Family Medicine

## 2023-10-18 DIAGNOSIS — R202 Paresthesia of skin: Secondary | ICD-10-CM

## 2023-10-18 DIAGNOSIS — G5601 Carpal tunnel syndrome, right upper limb: Secondary | ICD-10-CM

## 2023-10-18 DIAGNOSIS — G5623 Lesion of ulnar nerve, bilateral upper limbs: Secondary | ICD-10-CM

## 2023-10-18 NOTE — Procedures (Signed)
 [name]  [address], [address]  [place], [place] [address] Tel: [phone] Fax: [phone] Test Date:  10/18/2023  Patient: Patrick [name] DOB: Jul 21, 1960 Physician: Tonita Blanch, DO  Sex: Male Height: 5' 10 Ref Phys: Arthea Sharps, DO  ID#: [id number]   Technician:    History: This is a 63 year old man referred for evaluation of bilateral upper extremity paresthesias.  NCV & EMG Findings: Extensive electrodiagnostic testing of the right upper extremity and additional study of the left shows:  Right median sensory response shows prolonged latency (4.3 ms).  Left median, left mixed palmar, and bilateral ulnar sensory responses are within normal limits. Bilateral median motor responses are within normal limits.  Bilateral ulnar motor responses show slowed conduction velocity across the elbow (A Elbow-B Elbow, R45, L45 m/s).   There is no evidence of active or chronic motor axonal loss changes affecting any of the tested muscles.  Motor unit configuration and recruitment pattern is within normal limits.    Impression: Right median neuropathy at or distal to the wrist, consistent with a clinical diagnosis of carpal tunnel syndrome.  Overall, these findings are mild in degree electrically. Bilateral ulnar neuropathy with slowing across the elbow, demyelinating, mild.   ___________________________ Tonita Blanch, DO    Nerve Conduction Studies   Stim Site NR Peak (ms) Norm Peak (ms) O-P Amp (V) Norm O-P Amp  Left Median Anti Sensory (2nd Digit)  32 C  Wrist    3.7 <3.8 21.1 >10  Right Median Anti Sensory (2nd Digit)  32 C  Wrist    *4.3 <3.8 14.1 >10  Left Ulnar Anti Sensory (5th Digit)  32 C  Wrist    3.1 <3.2 13.8 >5  Right Ulnar Anti Sensory (5th Digit)  32 C  Wrist    2.7 <3.2 10.6 >5     Stim Site NR Onset (ms) Norm Onset (ms) O-P Amp (mV) Norm O-P Amp Site1 Site2 Delta-0 (ms) Dist (cm) Vel (m/s) Norm Vel (m/s)  Left Median Motor (Abd Poll Brev)   32 C  Wrist    3.6 <4.0 7.4 >5 Elbow Wrist 6.2 35.0 56 >50  Elbow    9.8  6.5         Right Median Motor (Abd Poll Brev)  32 C  Wrist    3.9 <4.0 6.2 >5 Elbow Wrist 6.0 32.0 53 >50  Elbow    9.9  5.7         Left Ulnar Motor (Abd Dig Minimi)  32 C  Wrist    2.3 <3.1 8.6 >7 B Elbow Wrist 4.0 25.0 63 >50  B Elbow    6.3  7.8  A Elbow B Elbow 2.2 10.0 *45 >50  A Elbow    8.5  7.6         Right Ulnar Motor (Abd Dig Minimi)  32 C  Wrist    2.5 <3.1 9.1 >7 B Elbow Wrist 4.2 25.0 60 >50  B Elbow    6.7  8.9  A Elbow B Elbow 2.2 10.0 *45 >50  A Elbow    8.9  8.6            Stim Site NR Peak (ms) Norm Peak (ms) P-T Amp (V) Site1 Site2 Delta-P (ms) Norm Delta (ms)  Left Median/Ulnar Palm Comparison (Wrist - 8cm)  32 C  Median Palm    2.2 <2.2 68.2 Median Palm Ulnar Palm 0.2   Ulnar Palm    2.0 <2.2  11.9       Electromyography   Side Muscle Ins.Act Fibs Fasc Recrt Amp Dur Poly Activation Comment  Right 1stDorInt Nml Nml Nml Nml Nml Nml Nml Nml N/A  Right Abd Poll Brev Nml Nml Nml Nml Nml Nml Nml Nml N/A  Right PronatorTeres Nml Nml Nml Nml Nml Nml Nml Nml N/A  Right Biceps Nml Nml Nml Nml Nml Nml Nml Nml N/A  Right Triceps Nml Nml Nml Nml Nml Nml Nml Nml N/A  Right Deltoid Nml Nml Nml Nml Nml Nml Nml Nml N/A  Left FlexCarpiUln Nml Nml Nml Nml Nml Nml Nml Nml N/A  Right FlexCarpiUln Nml Nml Nml Nml Nml Nml Nml Nml N/A  Left 1stDorInt Nml Nml Nml Nml Nml Nml Nml Nml N/A  Left PronatorTeres Nml Nml Nml Nml Nml Nml Nml Nml N/A  Left Biceps Nml Nml Nml Nml Nml Nml Nml Nml N/A  Left Triceps Nml Nml Nml Nml Nml Nml Nml Nml N/A  Left Deltoid Nml Nml Nml Nml Nml Nml Nml Nml N/A      Waveforms:

## 2023-10-23 ENCOUNTER — Ambulatory Visit: Attending: Cardiology | Admitting: Cardiology

## 2023-10-23 ENCOUNTER — Encounter: Payer: Self-pay | Admitting: Cardiology

## 2023-10-23 VITALS — BP 130/84 | HR 69 | Ht 70.0 in | Wt 236.0 lb

## 2023-10-23 DIAGNOSIS — I2510 Atherosclerotic heart disease of native coronary artery without angina pectoris: Secondary | ICD-10-CM

## 2023-10-23 DIAGNOSIS — E785 Hyperlipidemia, unspecified: Secondary | ICD-10-CM

## 2023-10-23 DIAGNOSIS — Z79899 Other long term (current) drug therapy: Secondary | ICD-10-CM

## 2023-10-23 DIAGNOSIS — I10 Essential (primary) hypertension: Secondary | ICD-10-CM | POA: Diagnosis not present

## 2023-10-23 NOTE — Patient Instructions (Signed)
 Medication Instructions:  No changes *If you need a refill on your cardiac medications before your next appointment, please call your pharmacy*  Lab Work: No labs  Testing/Procedures: No testing  Follow-Up: At [facility], you and your health needs are our priority.  As part of our continuing mission to provide you with exceptional heart care, our providers are all part of one team.  This team includes your primary Cardiologist (physician) and Advanced Practice Providers or APPs (Physician Assistants and Nurse Practitioners) who all work together to provide you with the care you need, when you need it.  Your next appointment:   Rescheduled September appointment to December with Oneil Parchment, MD    We recommend signing up for the patient portal called MyChart.  Sign up information is provided on this After Visit Summary.  MyChart is used to connect with patients for Virtual Visits (Telemedicine).  Patients are able to view lab/test results, encounter notes, upcoming appointments, etc.  Non-urgent messages can be sent to your provider as well.   To learn more about what you can do with MyChart, go to [email or web address].

## 2023-10-31 NOTE — Progress Notes (Unsigned)
 [name] [address] [place] [address] Phone: [phone] Subjective:   Patrick [name], am serving as a scribe for Dr. Arthea [name].  I'm seeing this patient by the request  of:  Patrick [name] ORN, MD  CC: left elbow pain   YEP:Dlagzrupcz  09/02/2023 Bilateral ulnar neuropathy.  Given injection and only the left side previously and did respond for some months but now coming back.  I feel that at this point a nerve conduction study and EMG would be helpful.  This would help us  evaluate the severity and location of the neuropathy that patient is having.  Follow-up again in 6 to 8 weeks.      Update 11/05/2023 Patrick [name] is a 63 y.o. male coming in with complaint of L elbow pain. Patient states doing about the same. Maybe worse on the right side. Pain has gotten more frequent and intense. Using devices is the worst. Not so much on laptop. Sleeping better and not waking up with numb hands. Changing grip in the gym has also helped.    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   Heart murmur    History of hiatal hernia    4 years ago per patient- no surgery needed   History of kidney stones    Hx of adenomatous polyp of colon    Hyperlipidemia    Hypertension    Past Surgical History:  Procedure Laterality Date   APPENDECTOMY     Arthroscopic knee surgeries on both knees     CHOLECYSTECTOMY     COLONOSCOPY     NASAL SEPTOPLASTY W/ TURBINOPLASTY Bilateral 04/13/2019   Procedure: NASAL SEPTOPLASTY WITH BILATERAL TURBINATE REDUCTION;  Surgeon: Patrick Clunes, MD;  Location: [facility];  Service: ENT;  Laterality: Bilateral;   TOTAL KNEE ARTHROPLASTY Left 01/21/2020   Procedure: LEFT TOTAL KNEE ARTHROPLASTY;  Surgeon: Patrick Cordella Hamilton, MD;  Location: [facility] OR;  Service: Orthopedics;   Laterality: Left;   TOTAL KNEE ARTHROPLASTY Right 04/08/2023   Procedure: RIGHT TOTAL KNEE ARTHROPLASTY;  Surgeon: Patrick Cordella Hamilton, MD;  [facility];  Service: Orthopedics;  Laterality: Right;   VASECTOMY     Social History   Socioeconomic History   Marital status: Married    Spouse name: Not on file   Number of children: Not on file   Years of education: Not on file   Highest education level: Not on file  Occupational History   Occupation: Teacher, early years/pre, Network engineer, Semi-retired  Tobacco Use   Smoking status: Former    Types: Cigars, Cigarettes   Smokeless tobacco: Never   Tobacco comments:    one cigar every few years  Vaping Use   Vaping status: Never Used  Substance and Sexual Activity   Alcohol use: Yes    Alcohol/week: 5.0 standard drinks of alcohol    Types: 5 Standard drinks or equivalent per week    Comment: socially   Drug use: No   Sexual activity: Yes  Other Topics Concern   Not on file  Social History Narrative   Married.   Education: [employer]   Exercise: Yes   Social Drivers of Corporate investment banker Strain: Not on file  Food Insecurity: No Food Insecurity (04/08/2023)  Hunger Vital Sign    Worried About Running Out of Food in the Last Year: Never true    Ran Out of Food in the Last Year: Never true  Transportation Needs: No Transportation Needs (04/08/2023)   PRAPARE - Administrator, Civil Service (Medical): No    Lack of Transportation (Non-Medical): No  Physical Activity: Not on file  Stress: Not on file  Social Connections: Not on file   Allergies  Allergen Reactions   Penicillins Hives    Rash around neck; 04/08/23 tolerated cefazolin     Family History  Adopted: Yes  Problem Relation Age of Onset   Colon cancer Neg Hx    Colon polyps Neg Hx    Esophageal cancer Neg Hx    Rectal cancer Neg Hx    Stomach cancer Neg Hx      Current Outpatient Medications (Cardiovascular):    rosuvastatin  (CRESTOR ) 20 MG tablet, Take 1  tablet (20 mg total) by mouth daily.   telmisartan  (MICARDIS ) 80 MG tablet, Take 1 tablet (80 mg total) by mouth daily.  Current Outpatient Medications (Respiratory):    cetirizine (ZYRTEC) 10 MG tablet, Take 10 mg by mouth at bedtime.    fluticasone  (FLONASE ) 50 MCG/ACT nasal spray, Place 2 sprays into both nostrils daily.  Current Outpatient Medications (Analgesics):    aspirin  EC 81 MG tablet, Take 1 tablet (81 mg total) by mouth daily. Swallow whole.   Current Outpatient Medications (Other):    Ascorbic Acid (VITAMIN C) 1000 MG tablet, Take 1,000 mg by mouth daily.   cholecalciferol (VITAMIN D3) 25 MCG (1000 UNIT) tablet, Take 1,000 Units by mouth daily.    gabapentin  (NEURONTIN ) 100 MG capsule, TAKE 2 CAPSULES BY MOUTH 3 TIMES A DAY AS NEEDED   Ginger, Zingiber officinalis, (GINGER ROOT) 550 MG CAPS, Take 550 mg by mouth daily.    Misc Natural Products (TART CHERRY ADVANCED) CAPS, Take 1 capsule by mouth daily.    Multiple Vitamin (MULTIVITAMIN WITH MINERALS) TABS tablet, Take 1 tablet by mouth daily. Men's Over 50    Turmeric Curcumin 500 MG CAPS, Take 500 mg by mouth daily.    Reviewed prior external information including notes and imaging from  primary care provider As well as notes that were available from care everywhere and other healthcare systems.  Past medical history, social, surgical and family history all reviewed in electronic medical record.  No pertanent information unless stated regarding to the chief complaint.   Review of Systems:  No headache, visual changes, nausea, vomiting, diarrhea, constipation, dizziness, abdominal pain, skin rash, fevers, chills, night sweats, weight loss, swollen lymph nodes, body aches, joint swelling, chest pain, shortness of breath, mood changes. POSITIVE muscle aches  Objective  There were no vitals taken for this visit.   General: No apparent distress alert and oriented x3 mood and affect normal, dressed appropriately.  HEENT:  Pupils equal, extraocular movements intact  Respiratory: Patient's speak in full sentences and does not appear short of breath  Cardiovascular: No lower extremity edema, non tender, no erythema  Left elbow exam shows nothing severe at this time.  Right elbow still tender to palpation to somewhat diffusely but more over the ulnar aspect. Patient does have positive Tinel's noted.  This is at the wrist.  Procedure: Real-time Ultrasound Guided Injection of right carpal tunnel Device: GE Logiq Q7  Ultrasound guided injection is preferred based studies that show increased duration, increased effect, greater accuracy, decreased procedural pain, increased response rate with  ultrasound guided versus blind injection.  Verbal informed consent obtained.  Time-out conducted.  Noted no overlying erythema, induration, or other signs of local infection.  Skin prepped in a sterile fashion.  Local anesthesia: Topical Ethyl chloride.  With sterile technique and under real time ultrasound guidance:  median nerve visualized.  23g 5/8 inch needle inserted distal to proximal approach into nerve sheath. Pictures taken nfor needle placement. Patient did have injection of 0.5 cc of 0.5% Marcaine , and 0.5 cc of Kenalog  40 mg/dL. Completed without difficulty  Pain immediately resolved suggesting accurate placement of the medication.  Advised to call if fevers/chills, erythema, induration, drainage, or persistent bleeding.  Images saved Impression: Technically successful ultrasound guided injection.    Impression and Recommendations:     The above documentation has been reviewed and is accurate and complete Tonyia Marschall M Jazzmine Kleiman, DO

## 2023-11-05 ENCOUNTER — Ambulatory Visit: Admitting: Family Medicine

## 2023-11-05 ENCOUNTER — Other Ambulatory Visit: Payer: Self-pay

## 2023-11-05 VITALS — BP 124/74 | HR 85 | Ht 70.0 in | Wt 237.0 lb

## 2023-11-05 DIAGNOSIS — G5601 Carpal tunnel syndrome, right upper limb: Secondary | ICD-10-CM

## 2023-11-05 DIAGNOSIS — M25521 Pain in right elbow: Secondary | ICD-10-CM | POA: Diagnosis not present

## 2023-11-05 DIAGNOSIS — M25522 Pain in left elbow: Secondary | ICD-10-CM

## 2023-11-05 NOTE — Patient Instructions (Addendum)
 Injection in wrist today Referral to [facility] See you again in 2 months Let us  know how you're doing in a month

## 2023-11-05 NOTE — Assessment & Plan Note (Addendum)
 Patient given injection and tolerated the procedure well, discussed icing regimen of home exercises, discussed which activities to do and which ones to avoid.  Increase activity slowly.  Discussed icing regimen and home exercises.  Follow-up again in 6 to 8 weeks.  Patient did want referral to orthopedic surgeon to discuss the potential for surgical intervention for the carpal tunnel if this does help.

## 2023-11-12 ENCOUNTER — Encounter: Payer: Self-pay | Admitting: Family Medicine

## 2023-11-12 ENCOUNTER — Other Ambulatory Visit: Payer: Self-pay

## 2023-11-12 MED ORDER — GABAPENTIN 100 MG PO CAPS: 200.0000 mg | ORAL_CAPSULE | Freq: Three times a day (TID) | ORAL | 1 refills | Status: DC | PRN

## 2023-11-27 ENCOUNTER — Ambulatory Visit: Admitting: Cardiology

## 2023-12-04 ENCOUNTER — Encounter: Payer: Self-pay | Admitting: Family Medicine

## 2024-01-06 ENCOUNTER — Encounter: Payer: Self-pay | Admitting: Radiology

## 2024-01-06 NOTE — Progress Notes (Unsigned)
 [facility] [address] [place] [address] Phone: [phone] Subjective:   Patrick [name], am serving as a scribe for Dr. Arthea Claudene.  I'm seeing this patient by the request  of:  Norleen Lynwood ORN, MD  CC: Bilateral elbow pain  YEP:Dlagzrupcz  11/05/2023 Patient given injection and tolerated the procedure well, discussed icing regimen of home exercises, discussed which activities to do and which ones to avoid.  Increase activity slowly.  Discussed icing regimen and home exercises.  Follow-up again in 6 to 8 weeks.  Patient did want referral to orthopedic surgeon to discuss the potential for surgical intervention for the carpal tunnel if this does help.      Update 01/07/2024 Patrick [name] is a 63 y.o. male coming in with complaint of B elbow pain.  Known ulnar neuropathy previously.  Patient states injection last appointment helped. L elbow still tender and hand keeps going numb finger 1-3. Elbow is always sore. R is doing much better. Pain in R is basically gone.  Patient did have a nerve conduction test done in August.  Showed that patient did have some right sided carpal tunnel mild to moderate in degree and bilateral ulnar neuropathy mild to moderate as well.     Past Medical History:  Diagnosis Date   Allergy     Arthritis    left knee   Dysrhythmia     [name]   Heart murmur    History of hiatal hernia    4 years ago per patient- no surgery needed   History of kidney stones    Hx of adenomatous polyp of colon    Hyperlipidemia    Hypertension    Past Surgical History:  Procedure Laterality Date   APPENDECTOMY     Arthroscopic knee surgeries on both knees     CHOLECYSTECTOMY     COLONOSCOPY     NASAL SEPTOPLASTY W/ TURBINOPLASTY Bilateral 04/13/2019   Procedure: NASAL SEPTOPLASTY WITH BILATERAL TURBINATE REDUCTION;  Surgeon: Karis Clunes, MD;  Location: [facility];  Service: ENT;   Laterality: Bilateral;   TOTAL KNEE ARTHROPLASTY Left 01/21/2020   Procedure: LEFT TOTAL KNEE ARTHROPLASTY;  Surgeon: Addie Cordella Hamilton, MD;  Location: [facility] OR;  Service: Orthopedics;  Laterality: Left;   TOTAL KNEE ARTHROPLASTY Right 04/08/2023   Procedure: RIGHT TOTAL KNEE ARTHROPLASTY;  Surgeon: Addie Cordella Hamilton, MD;  Location: [facility] OR;  Service: Orthopedics;  Laterality: Right;   VASECTOMY     Social History   Socioeconomic History   Marital status: Married    Spouse name: Not on file   Number of children: Not on file   Years of education: Not on file   Highest education level: Not on file  Occupational History   Occupation: Teacher, Early Years/pre, Network Engineer, Semi-retired  Tobacco Use   Smoking status: Former    Types: Cigars, Cigarettes   Smokeless tobacco: Never   Tobacco comments:    one cigar every few years  Vaping Use   Vaping status: Never Used  Substance and Sexual Activity   Alcohol use: Yes    Alcohol/week: 5.0 standard drinks of alcohol    Types: 5 Standard drinks or equivalent per week    Comment: socially   Drug use: No   Sexual activity: Yes  Other Topics Concern   Not on file  Social History Narrative   Married.   Education: College   Exercise: Yes  Social Drivers of Corporate Investment Banker Strain: Not on file  Food Insecurity: No Food Insecurity (04/08/2023)   Hunger Vital Sign    Worried About Running Out of Food in the Last Year: Never true    Ran Out of Food in the Last Year: Never true  Transportation Needs: No Transportation Needs (04/08/2023)   PRAPARE - Administrator, Civil Service (Medical): No    Lack of Transportation (Non-Medical): No  Physical Activity: Not on file  Stress: Not on file  Social Connections: Not on file   Allergies  Allergen Reactions   Penicillins Hives    Rash around neck; 04/08/23 tolerated cefazolin     Family History  Adopted: Yes  Problem Relation Age of Onset   Colon cancer Neg Hx    Colon polyps Neg Hx     Esophageal cancer Neg Hx    Rectal cancer Neg Hx    Stomach cancer Neg Hx      Current Outpatient Medications (Cardiovascular):    rosuvastatin  (CRESTOR ) 20 MG tablet, Take 1 tablet (20 mg total) by mouth daily.   telmisartan  (MICARDIS ) 80 MG tablet, Take 1 tablet (80 mg total) by mouth daily.  Current Outpatient Medications (Respiratory):    cetirizine (ZYRTEC) 10 MG tablet, Take 10 mg by mouth at bedtime.    fluticasone  (FLONASE ) 50 MCG/ACT nasal spray, Place 2 sprays into both nostrils daily.  Current Outpatient Medications (Analgesics):    aspirin  EC 81 MG tablet, Take 1 tablet (81 mg total) by mouth daily. Swallow whole.   Current Outpatient Medications (Other):    Ascorbic Acid (VITAMIN C) 1000 MG tablet, Take 1,000 mg by mouth daily.   cholecalciferol (VITAMIN D3) 25 MCG (1000 UNIT) tablet, Take 1,000 Units by mouth daily.    gabapentin  (NEURONTIN ) 100 MG capsule, Take 2 capsules (200 mg total) by mouth 3 (three) times daily as needed.   Ginger, Zingiber officinalis, (GINGER ROOT) 550 MG CAPS, Take 550 mg by mouth daily.    Misc Natural Products (TART CHERRY ADVANCED) CAPS, Take 1 capsule by mouth daily.    Multiple Vitamin (MULTIVITAMIN WITH MINERALS) TABS tablet, Take 1 tablet by mouth daily. Men's Over 50    Turmeric Curcumin 500 MG CAPS, Take 500 mg by mouth daily.    Reviewed prior external information including notes and imaging from  primary care provider As well as notes that were available from care everywhere and other healthcare systems.  Past medical history, social, surgical and family history all reviewed in electronic medical record.  No pertanent information unless stated regarding to the chief complaint.   Review of Systems:  No headache, visual changes, nausea, vomiting, diarrhea, constipation, dizziness, abdominal pain, skin rash, fevers, chills, night sweats, weight loss, swollen lymph nodes, body aches, joint swelling, chest pain, shortness of breath,  mood changes. POSITIVE muscle aches  Objective  Blood pressure 110/72, pulse 85, height 5' 10 (1.778 m), weight 235 lb (106.6 kg), SpO2 96%.   General: No apparent distress alert and oriented x3 mood and affect normal, dressed appropriately.  HEENT: Pupils equal, extraocular movements intact  Respiratory: Patient's speak in full sentences and does not appear short of breath  Cardiovascular: No lower extremity edema, non tender, no erythema  Left wrist does have a positive tenderness noted.  Right wrist significantly better than previous exam.  Patient does though have mild decrease in grip strength on the left side.  Procedure: Real-time Ultrasound Guided Injection of left carpal tunnel Device:  GE Logiq Q7 Ultrasound guided injection is preferred based studies that show increased duration, increased effect, greater accuracy, decreased procedural pain, increased response rate with ultrasound guided versus blind injection.  Verbal informed consent obtained.  Time-out conducted.  Noted no overlying erythema, induration, or other signs of local infection.  Skin prepped in a sterile fashion.  Local anesthesia: Topical Ethyl chloride.  With sterile technique and under real time ultrasound guidance:  median nerve visualized.  23g 5/8 inch needle inserted distal to proximal approach into nerve sheath. Pictures taken nfor needle placement. Patient did have injection of 0.5 cc of 0.5% Marcaine , and 1 cc of Depo-Medrol  40 mg/dL. Completed without difficulty  Pain immediately resolved suggesting accurate placement of the medication.  Advised to call if fevers/chills, erythema, induration, drainage, or persistent bleeding.  Images permanently stored .  Impression: Technically successful ultrasound guided injection.    Impression and Recommendations:     The above documentation has been reviewed and is accurate and complete Soumya Colson M Ieasha Boerema, DO

## 2024-01-07 ENCOUNTER — Ambulatory Visit: Admitting: Family Medicine

## 2024-01-07 ENCOUNTER — Encounter: Payer: Self-pay | Admitting: Family Medicine

## 2024-01-07 ENCOUNTER — Other Ambulatory Visit: Payer: Self-pay

## 2024-01-07 VITALS — BP 110/72 | HR 85 | Ht 70.0 in | Wt 235.0 lb

## 2024-01-07 DIAGNOSIS — M25522 Pain in left elbow: Secondary | ICD-10-CM | POA: Diagnosis not present

## 2024-01-07 DIAGNOSIS — G5602 Carpal tunnel syndrome, left upper limb: Secondary | ICD-10-CM

## 2024-01-07 DIAGNOSIS — G5601 Carpal tunnel syndrome, right upper limb: Secondary | ICD-10-CM | POA: Diagnosis not present

## 2024-01-07 DIAGNOSIS — M25521 Pain in right elbow: Secondary | ICD-10-CM

## 2024-01-07 MED ORDER — METHYLPREDNISOLONE ACETATE 40 MG/ML IJ SUSP
20.0000 mg | Freq: Once | INTRAMUSCULAR | Status: AC
  Administered 2024-01-07: 20 mg via INTRA_ARTICULAR

## 2024-01-07 NOTE — Assessment & Plan Note (Signed)
 Patient given injection and tolerated the procedure well, discussed icing regimen and home exercises, discussed which activities to do and which ones to avoid.  Discussed potential bracing at night.  Patient responded so well to the contralateral side hopeful that this will make an improvement and follow-up again in 2 to 3 months

## 2024-01-07 NOTE — Patient Instructions (Signed)
 Best thing for wrist and elbow pain is holding those babies See you again in 2-3 months

## 2024-01-07 NOTE — Assessment & Plan Note (Signed)
 Significant improvement noted at this time.  Discussed icing regimen and home exercises as well as bracing still.  Hopeful that this will last at least 9 to 18 months.

## 2024-02-05 ENCOUNTER — Ambulatory Visit: Attending: Cardiology | Admitting: Cardiology

## 2024-02-05 ENCOUNTER — Encounter (HOSPITAL_COMMUNITY): Payer: Self-pay

## 2024-02-05 ENCOUNTER — Encounter: Payer: Self-pay | Admitting: Cardiology

## 2024-02-05 VITALS — BP 118/78 | HR 97 | Ht 70.0 in | Wt 231.0 lb

## 2024-02-05 DIAGNOSIS — I2510 Atherosclerotic heart disease of native coronary artery without angina pectoris: Secondary | ICD-10-CM

## 2024-02-05 DIAGNOSIS — E785 Hyperlipidemia, unspecified: Secondary | ICD-10-CM

## 2024-02-05 DIAGNOSIS — H40013 Open angle with borderline findings, low risk, bilateral: Secondary | ICD-10-CM | POA: Diagnosis not present

## 2024-02-05 MED ORDER — METOPROLOL TARTRATE 50 MG PO TABS: 50.0000 mg | ORAL_TABLET | ORAL | 0 refills | Status: AC

## 2024-02-05 NOTE — Patient Instructions (Addendum)
 Medication Instructions:  The current medical regimen is effective;  continue present plan and medications.  *If you need a refill on your cardiac medications before your next appointment, please call your pharmacy*  Testing/Procedures:   Your cardiac CT will be scheduled at:  [facility] [address]  [place], [place] [address]  Please enter the parking lot using the [address] entrance and use the FREE valet service at the patient drop-off area. Enter the building and check-in with registration on the main floor.  Please follow these instructions carefully (unless otherwise directed):  An IV will be required for this test and Nitroglycerin  will be given.  Hold all erectile dysfunction medications at least 3 days (72 hrs) prior to test. (Ie viagra, cialis, sildenafil, tadalafil, etc)   On the Night Before the Test: Be sure to Drink plenty of water . Do not consume any caffeinated/decaffeinated beverages or chocolate 12 hours prior to your test. Do not take any antihistamines 12 hours prior to your test.  On the Day of the Test: Drink plenty of water  until 1 hour prior to the test. Do not eat any food 1 hour prior to test. You may take your regular medications prior to the test.  Take metoprolol  (Lopressor ) two hours prior to test.  Hold Telmisartan  this AM. If you take Furosemide/Hydrochlorothiazide/Spironolactone/Chlorthalidone, please HOLD on the morning of the test. Patients who wear a continuous glucose monitor MUST remove the device prior to scanning.      After the Test: Drink plenty of water . After receiving IV contrast, you may experience a mild flushed feeling. This is normal. On occasion, you may experience a mild rash up to 24 hours after the test. This is not dangerous. If this occurs, you can take Benadryl 25 mg, Zyrtec, Claritin, or Allegra and increase your fluid intake. (Patients taking Tikosyn should avoid Benadryl, and may take  Zyrtec, Claritin, or Allegra) If you experience trouble breathing, this can be serious. If it is severe call 911 IMMEDIATELY. If it is mild, please call our office.  We will call to schedule your test 2-4 weeks out understanding that some insurance companies will need an authorization prior to the service being performed.   For more information and frequently asked questions, please visit our website : [email or web address]  For non-scheduling related questions, please contact the cardiac imaging nurse navigator should you have any questions/concerns: Cardiac Imaging Nurse Navigators Direct Office Dial: [phone]   For scheduling needs, including cancellations and rescheduling, please call Brittany, [phone].   Follow-Up: At [facility], you and your health needs are our priority.  As part of our continuing mission to provide you with exceptional heart care, our providers are all part of one team.  This team includes your primary Cardiologist (physician) and Advanced Practice Providers or APPs (Physician Assistants and Nurse Practitioners) who all work together to provide you with the care you need, when you need it.  Your next appointment:   1 year(s)  Provider:   Oneil Parchment, MD    We recommend signing up for the patient portal called MyChart.  Sign up information is provided on this After Visit Summary.  MyChart is used to connect with patients for Virtual Visits (Telemedicine).  Patients are able to view lab/test results, encounter notes, upcoming appointments, etc.  Non-urgent messages can be sent to your provider as well.   To learn more about what you can do with MyChart, go to [email or web address].

## 2024-02-05 NOTE — Progress Notes (Signed)
 Cardiology Office Note:  .   Date:  02/05/2024  ID:  Patrick [name], DOB [birth year], MRN [id number] PCP: Norleen Lynwood ORN, MD  [facility] Providers Cardiologist:  Oneil Parchment, MD     History of Present Illness: Patrick   Torrance [name] is a 63 y.o. male Discussed the use of AI scribe   History of Present Illness Patrick [name] is a 63 year old male with coronary artery disease who presents with an elevated coronary calcium  score. He was referred by Dr. Norleen for evaluation of his coronary calcium  score.  He has a history of coronary artery disease and was previously evaluated in 2022 when a cardiac CT showed calcified plaque. He was advised to follow up after three years, leading to the current evaluation.  He reports no significant changes in lifestyle, diet, or exercise over the past three years. He has been on rosuvastatin  20 mg daily and aspirin  81 mg daily. His LDL cholesterol was 57 on October 15, 2023, with varying LDL levels in recent tests, including a calculated LDL of 38 on October 08, 2023, and 42 on September 02, 2023. His lipoprotein(a) level was 46.  He has a history of hyperlipidemia and hypertension. No syncope or significant cardiac symptoms. He has experienced occasional heart flutters in the past, which were evaluated by a cardiologist in [place]  10-15 years ago, resulting in no significant findings. He underwent a stress test at that time, which was normal.  He underwent knee replacement surgery in February 2025, after which he experienced hip and back pain. He received injections for spinal stenosis in the lower back, which was exacerbated by the surgery. This has impacted his ability to perform cardiovascular exercise, although he has resumed weight lifting.  He has a limited family history due to being adopted, with some information from his mother's side but none from his father's side. He has recently connected with siblings who have different fathers.  He has a history of  high-stress jobs and has moderated his caffeine intake.       Studies Reviewed: .        Results LABS Lipoprotein(a): 46 LDL: 38 (10/08/2023) LDL: 57 (10/15/2023) LDL: 42 (09/02/2023) Triglycerides: >600 (10/08/2023) Triglycerides: 113 (10/15/2023) Triglycerides: 195 (09/02/2023)  RADIOLOGY Coronary CT: Calcium  score 542 (09/22/2023) Risk Assessment/Calculations:            Physical Exam:   VS:  BP 118/78   Pulse 97   Ht 5' 10 (1.778 m)   Wt 231 lb (104.8 kg)   SpO2 93%   BMI 33.15 kg/m    Wt Readings from Last 3 Encounters:  02/05/24 231 lb (104.8 kg)  01/07/24 235 lb (106.6 kg)  11/05/23 237 lb (107.5 kg)    GEN: Well nourished, well developed in no acute distress NECK: No JVD; No carotid bruits CARDIAC: RRR, no murmurs, no rubs, no gallops RESPIRATORY:  Clear to auscultation without rales, wheezing or rhonchi  ABDOMEN: Soft, non-tender, non-distended EXTREMITIES:  No edema; No deformity   ASSESSMENT AND PLAN: .    Assessment and Plan Assessment & Plan Coronary artery disease with elevated coronary calcium  score Coronary artery disease with a coronary calcium  score of 542, indicating calcified plaque. The increase in calcium  score may be due to stabilization of soft plaque by rosuvastatin , which can lead to calcification. The goal is to assess for high-risk stenosis, particularly in the anterior coronary arteries, to prevent myocardial infarction. - Ordered coronary CT angiography with contrast to assess for high-risk  stenosis. - Continue rosuvastatin  20 mg daily. - Continue aspirin  81 mg daily.  Hyperlipidemia on statin therapy Hyperlipidemia is well-controlled with rosuvastatin , with LDL cholesterol at 57 mg/dL, below the target of 70 mg/dL. The NMR profile shows slightly increased small LDL particles, but overall lipid profile is favorable. Rosuvastatin  is effective in stabilizing plaque and reducing cardiovascular risk. - Continue rosuvastatin  20 mg  daily.  Hypertension Managed with current medications. Heart rate was elevated at 118 bpm, possibly due to anxiety or other factors. - Will consider metoprolol 50 mg if heart rate remains elevated.  Elevated body mass index (BMI) Elevated BMI with a focus on weight management to improve cardiovascular health. Discussed potential use of GLP-1 agonists for weight loss, noting benefits and potential side effects such as nausea and vomiting. Emphasized the importance of diet and exercise, including the Mediterranean diet and regular physical activity. - Encouraged adherence to a Mediterranean diet. - Encouraged regular physical activity, including cardio and strength training. - Discussed potential use of GLP-1 agonists for weight loss if needed.          Signed, Oneil Parchment, MD

## 2024-02-07 ENCOUNTER — Ambulatory Visit (HOSPITAL_COMMUNITY)
Admission: RE | Admit: 2024-02-07 | Discharge: 2024-02-07 | Disposition: A | Source: Ambulatory Visit | Attending: Cardiology | Admitting: Cardiology

## 2024-02-07 DIAGNOSIS — E785 Hyperlipidemia, unspecified: Secondary | ICD-10-CM | POA: Diagnosis not present

## 2024-02-07 DIAGNOSIS — I2510 Atherosclerotic heart disease of native coronary artery without angina pectoris: Secondary | ICD-10-CM

## 2024-02-07 MED ORDER — IOHEXOL 350 MG/ML SOLN
100.0000 mL | Freq: Once | INTRAVENOUS | Status: AC | PRN
  Administered 2024-02-07: 100 mL via INTRAVENOUS

## 2024-02-07 MED ORDER — METOPROLOL TARTRATE 5 MG/5ML IV SOLN
INTRAVENOUS | Status: AC
  Filled 2024-02-07: qty 5

## 2024-02-07 MED ORDER — NITROGLYCERIN 0.4 MG SL SUBL
SUBLINGUAL_TABLET | SUBLINGUAL | Status: AC
  Filled 2024-02-07: qty 2

## 2024-02-09 ENCOUNTER — Ambulatory Visit: Payer: Self-pay | Admitting: Cardiology

## 2024-03-09 ENCOUNTER — Other Ambulatory Visit: Payer: Self-pay | Admitting: Family Medicine

## 2024-03-10 NOTE — Progress Notes (Unsigned)
 " [name] [address] [place] [address] Phone: [phone] Subjective:   ISusannah Gully, am serving as a scribe for Dr. Arthea Claudene.  I'm seeing this patient by the request  of:  Norleen Lynwood ORN, MD  CC: Bilateral elbow pain YEP:Dlagzrupcz  01/07/2024 Patient given injection and tolerated the procedure well, discussed icing regimen and home exercises, discussed which activities to do and which ones to avoid.  Discussed potential bracing at night.  Patient responded so well to the contralateral side hopeful that this will make an improvement and follow-up again in 2 to 3 months     Significant improvement noted at this time.  Discussed icing regimen and home exercises as well as bracing still.  Hopeful that this will last at least 9 to 18 months.      Update 03/11/2024 Curry Seefeldt is a 64 y.o. male coming in with complaint of B elbow pain. Patient states R hand numbness is back. The injections worked well last time. Lasted for a while, just started wearing off about 2 weeks ago. No new symptoms.    Cardiac scoring and a CT calcium  score was 530 which puts patient in the 80th percentile.  Patient is on aspirin  as well as lipid management.  Past Medical History:  Diagnosis Date   Allergy     Arthritis    left knee   Dysrhythmia     [name]   Heart murmur    History of hiatal hernia    4 years ago per patient- no surgery needed   History of kidney stones    Hx of adenomatous polyp of colon    Hyperlipidemia    Hypertension    Past Surgical History:  Procedure Laterality Date   APPENDECTOMY     Arthroscopic knee surgeries on both knees     CHOLECYSTECTOMY     COLONOSCOPY     NASAL SEPTOPLASTY W/ TURBINOPLASTY Bilateral 04/13/2019   Procedure: NASAL SEPTOPLASTY WITH BILATERAL TURBINATE REDUCTION;  Surgeon: Karis Clunes, MD;  Location: [facility];  Service: ENT;  Laterality: Bilateral;    TOTAL KNEE ARTHROPLASTY Left 01/21/2020   Procedure: LEFT TOTAL KNEE ARTHROPLASTY;  Surgeon: Addie Cordella Hamilton, MD;  Location: [facility] OR;  Service: Orthopedics;  Laterality: Left;   TOTAL KNEE ARTHROPLASTY Right 04/08/2023   Procedure: RIGHT TOTAL KNEE ARTHROPLASTY;  Surgeon: Addie Cordella Hamilton, MD;  Location: [facility];  Service: Orthopedics;  Laterality: Right;   VASECTOMY     Social History   Socioeconomic History   Marital status: Married    Spouse name: Not on file   Number of children: Not on file   Years of education: Not on file   Highest education level: Not on file  Occupational History   Occupation: Teacher, Early Years/pre, Network Engineer, Semi-retired  Tobacco Use   Smoking status: Former    Types: Cigars, Cigarettes   Smokeless tobacco: Never   Tobacco comments:    one cigar every few years  Vaping Use   Vaping status: Never Used  Substance and Sexual Activity   Alcohol use: Yes    Alcohol/week: 5.0 standard drinks of alcohol    Types: 5 Standard drinks or equivalent per week    Comment: socially   Drug use: No   Sexual activity: Yes  Other Topics Concern   Not on file  Social History Narrative   Married.   Education: College   Exercise:  Yes   Social Drivers of Health   Tobacco Use: Medium Risk (02/05/2024)   Patient History    Smoking Tobacco Use: Former    Smokeless Tobacco Use: Never    Passive Exposure: Not on file  Financial Resource Strain: Not on file  Food Insecurity: No Food Insecurity (04/08/2023)   Hunger Vital Sign    Worried About Running Out of Food in the Last Year: Never true    Ran Out of Food in the Last Year: Never true  Transportation Needs: No Transportation Needs (04/08/2023)   PRAPARE - Administrator, Civil Service (Medical): No    Lack of Transportation (Non-Medical): No  Physical Activity: Not on file  Stress: Not on file  Social Connections: Not on file  Depression (PHQ2-9): Low Risk (09/04/2023)   Depression (PHQ2-9)    PHQ-2 Score: 0   Alcohol Screen: Not on file  Housing: Low Risk (04/08/2023)   Housing Stability Vital Sign    Unable to Pay for Housing in the Last Year: No    Number of Times Moved in the Last Year: 0    Homeless in the Last Year: No  Utilities: Not At Risk (04/08/2023)   [employer] Utilities    Threatened with loss of utilities: No  Health Literacy: Not on file   Allergies[1] Family History  Adopted: Yes  Problem Relation Age of Onset   Colon cancer Neg Hx    Colon polyps Neg Hx    Esophageal cancer Neg Hx    Rectal cancer Neg Hx    Stomach cancer Neg Hx     Current Outpatient Medications (Cardiovascular):    metoprolol  tartrate (LOPRESSOR ) 50 MG tablet, Take 1 tablet (50 mg total) by mouth as directed. Take one tablet (2) hours before your CT scan   rosuvastatin  (CRESTOR ) 20 MG tablet, Take 1 tablet (20 mg total) by mouth daily.   telmisartan  (MICARDIS ) 80 MG tablet, Take 1 tablet (80 mg total) by mouth daily.  Current Outpatient Medications (Respiratory):    cetirizine (ZYRTEC) 10 MG tablet, Take 10 mg by mouth at bedtime.    fluticasone  (FLONASE ) 50 MCG/ACT nasal spray, Place 2 sprays into both nostrils daily.  Current Outpatient Medications (Analgesics):    aspirin  EC 81 MG tablet, Take 1 tablet (81 mg total) by mouth daily. Swallow whole.  Current Outpatient Medications (Other):    cholecalciferol (VITAMIN D3) 25 MCG (1000 UNIT) tablet, Take 1,000 Units by mouth daily.    gabapentin  (NEURONTIN ) 100 MG capsule, TAKE 2 CAPSULES BY MOUTH 3 TIMES A DAY AS NEEDED   Ginger, Zingiber officinalis, (GINGER ROOT) 550 MG CAPS, Take 550 mg by mouth daily.    Misc Natural Products (TART CHERRY ADVANCED) CAPS, Take 1 capsule by mouth daily.    Multiple Vitamin (MULTIVITAMIN WITH MINERALS) TABS tablet, Take 1 tablet by mouth daily. Men's Over 50    Turmeric Curcumin 500 MG CAPS, Take 500 mg by mouth daily.    Reviewed prior external information including notes and imaging from  primary care provider As  well as notes that were available from care everywhere and other healthcare systems.  Past medical history, social, surgical and family history all reviewed in electronic medical record.  No pertanent information unless stated regarding to the chief complaint.   Review of Systems:  No headache, visual changes, nausea, vomiting, diarrhea, constipation, dizziness, abdominal pain, skin rash, fevers, chills, night sweats, weight loss, swollen lymph nodes, body aches, joint swelling, chest pain, shortness of  breath, mood changes. POSITIVE muscle aches  Objective  Blood pressure 118/70, pulse 97, height 5' 10 (1.778 m), weight 237 lb (107.5 kg), SpO2 (!) 89%.   General: No apparent distress alert and oriented x3 mood and affect normal, dressed appropriately.  HEENT: Pupils equal, extraocular movements intact  Respiratory: Patient's speak in full sentences and does not appear short of breath  Cardiovascular: No lower extremity edema, non tender, no erythema  Right wrist does have positive Tinel's noted.  No thenar eminence wasting noted.  Procedure: Real-time Ultrasound Guided Injection of right carpal tunnel Device: GE Logiq Q7  Ultrasound guided injection is preferred based studies that show increased duration, increased effect, greater accuracy, decreased procedural pain, increased response rate with ultrasound guided versus blind injection.  Verbal informed consent obtained.  Time-out conducted.  Noted no overlying erythema, induration, or other signs of local infection.  Skin prepped in a sterile fashion.  Local anesthesia: Topical Ethyl chloride.  With sterile technique and under real time ultrasound guidance:  median nerve visualized.  23g 5/8 inch needle inserted distal to proximal approach into nerve sheath. Pictures taken nfor needle placement. Patient did have injection of, 2 cc of 0.5% Marcaine , and 1 cc of Kenalog  40 mg/dL.  We did do more of a hydrodissection along neuronal  sheath Completed without difficulty  Pain immediately resolved suggesting accurate placement of the medication.  Significant numbness Advised to call if fevers/chills, erythema, induration, drainage, or persistent bleeding.  Images saved Impression: Technically successful ultrasound guided injection.    Impression and Recommendations:     The above documentation has been reviewed and is accurate and complete Arthea CHRISTELLA Sharps, DO       [1]  Allergies Allergen Reactions   Penicillins Hives    Rash around neck; 04/08/23 tolerated cefazolin     "

## 2024-03-11 ENCOUNTER — Ambulatory Visit: Admitting: Family Medicine

## 2024-03-11 ENCOUNTER — Encounter: Payer: Self-pay | Admitting: Family Medicine

## 2024-03-11 ENCOUNTER — Ambulatory Visit: Payer: Self-pay

## 2024-03-11 VITALS — BP 118/70 | HR 97 | Ht 70.0 in | Wt 237.0 lb

## 2024-03-11 DIAGNOSIS — M25521 Pain in right elbow: Secondary | ICD-10-CM | POA: Diagnosis not present

## 2024-03-11 DIAGNOSIS — G5601 Carpal tunnel syndrome, right upper limb: Secondary | ICD-10-CM | POA: Diagnosis not present

## 2024-03-11 NOTE — Patient Instructions (Signed)
 Congrats See you again in 10-12 weeks

## 2024-03-11 NOTE — Assessment & Plan Note (Signed)
 Hopeful for improvement.  Discussed icing regimen and home exercises, patient did respond extremely well to the injection previously with full improvement but shorter duration so tried more of a hydrodissection.  Follow up with me again in 6 to 12 weeks to see how patient is responding.  If comes back within 3 to 4 months we will need to consider surgical intervention.

## 2024-04-05 ENCOUNTER — Other Ambulatory Visit: Payer: Self-pay | Admitting: Family Medicine

## 2024-04-30 ENCOUNTER — Encounter: Admitting: Nurse Practitioner

## 2024-05-20 ENCOUNTER — Ambulatory Visit: Admitting: Family Medicine

## 2024-09-07 ENCOUNTER — Other Ambulatory Visit

## 2024-09-10 ENCOUNTER — Encounter: Admitting: Internal Medicine
